# Patient Record
Sex: Female | Born: 1980 | Race: White | Hispanic: No | Marital: Single | Smoking: Former smoker
Health system: Southern US, Community
[De-identification: ages and names within clinical notes are randomized; demographics above are authoritative.]

## PROBLEM LIST (undated history)

## (undated) DIAGNOSIS — Z9889 Other specified postprocedural states: Secondary | ICD-10-CM

## (undated) DIAGNOSIS — R112 Nausea with vomiting, unspecified: Secondary | ICD-10-CM

## (undated) DIAGNOSIS — K219 Gastro-esophageal reflux disease without esophagitis: Secondary | ICD-10-CM

## (undated) DIAGNOSIS — M1990 Unspecified osteoarthritis, unspecified site: Secondary | ICD-10-CM

## (undated) DIAGNOSIS — T7840XA Allergy, unspecified, initial encounter: Secondary | ICD-10-CM

## (undated) DIAGNOSIS — N318 Other neuromuscular dysfunction of bladder: Secondary | ICD-10-CM

## (undated) HISTORY — PX: WISDOM TOOTH EXTRACTION: SHX21

## (undated) HISTORY — PX: OTHER SURGICAL HISTORY: SHX169

## (undated) HISTORY — DX: Allergy, unspecified, initial encounter: T78.40XA

## (undated) HISTORY — DX: Unspecified osteoarthritis, unspecified site: M19.90

---

## 2005-08-26 ENCOUNTER — Other Ambulatory Visit: Admission: RE | Admit: 2005-08-26 | Discharge: 2005-08-26 | Payer: Self-pay | Admitting: Gynecology

## 2006-06-02 ENCOUNTER — Ambulatory Visit: Payer: Self-pay | Admitting: Endocrinology

## 2007-07-23 ENCOUNTER — Encounter: Payer: Self-pay | Admitting: *Deleted

## 2007-07-23 DIAGNOSIS — L708 Other acne: Secondary | ICD-10-CM | POA: Insufficient documentation

## 2007-07-23 DIAGNOSIS — I959 Hypotension, unspecified: Secondary | ICD-10-CM | POA: Insufficient documentation

## 2010-04-10 ENCOUNTER — Ambulatory Visit: Payer: Self-pay | Admitting: Internal Medicine

## 2011-03-07 NOTE — Consult Note (Signed)
[facility]                            ENDOCRINOLOGY CONSULTATION   NAME:Ruth [name], Ruth [name]                          MRN:          [id number]  DATE:06/02/2006                            DOB:          1981-09-22    REASON FOR REFERRAL:  New patient self-referred.  Reason for referral is  several symptoms.   HISTORY OF PRESENT ILLNESS:  A 31 year old woman who states that she has  gained 15 pounds over the past four months after quitting smoking.  She has  associated moderate fatigue.   Several months of muscle cramps with exertion.   She had a 26-month episode of no menstrual periods, but her periods have now  resumed for the past two months.  She states that her gynecologist has told  her these symptoms are due to her schedule of athletic training.   Her acne is increased over the past year or so.  She states that she is not  at risk for  pregnancy.   PAST MEDICAL HISTORY:  Otherwise healthy.   MEDICATIONS:  No medications.   SOCIAL HISTORY:  She works as a Systems analyst.  She is single.   FAMILY HISTORY:  Negative for thyroid disease.   REVIEW OF SYSTEMS:  Slight anxiety, but she denies the following:  Fever,  headache, syncope, palpitations, nausea, vomiting, skin rash, and  depression.   PHYSICAL EXAMINATION:  VITAL SIGNS:  Blood pressure 91/59, heart rate 63,  temperature 98.2, weight 150.  GENERAL:  No distress.  SKIN:  She has acne on her face.  She does not have any increased terminal  hair.  NECK:  No goiter.  CHEST:  Clear to auscultation.  No respiratory distress.  CARDIOVASCULAR:  No JVD, no edema.  Regular rate and rhythm.  No murmur.  MUSCULOSKELETAL:  Gait is observed in the office to be normal.  NEUROLOGIC:  Alert and well-oriented.  Does not appear anxious nor depressed  and there is no tremor.   LABORATORY STUDIES:  TSH normal at 0.50.  Also, [facility] stimulation test is  done in the office on 06/02/2006.  Random cortisol 8.8  mcg/dL.  She was given  250 mcg of cosyntropin intramuscularly, and 45 minutes later, her serum  cortisol level increases to 24.5.   IMPRESSION:  1. Weight gain of uncertain etiology.  2. Mild hypotension, which is physiologic.  3. Acne.  4. No abnormality of HPA access nor of her thyroid is found.   PLAN:  Return here p.r.n..  Continue your regular followup with Dr. Nicholas Lose.                                   Sean A. Everardo All, MD   SAE/MedQ  DD:  06/04/2006  DT:  06/04/2006  Job #:  [id number]   cc:   Gretta Cool, MD

## 2011-08-26 DIAGNOSIS — D229 Melanocytic nevi, unspecified: Secondary | ICD-10-CM

## 2011-08-26 HISTORY — DX: Melanocytic nevi, unspecified: D22.9

## 2011-09-05 HISTORY — PX: SKIN BIOPSY: SHX1

## 2011-11-10 ENCOUNTER — Ambulatory Visit (INDEPENDENT_AMBULATORY_CARE_PROVIDER_SITE_OTHER): Payer: BC Managed Care – PPO

## 2011-11-10 DIAGNOSIS — M7150 Other bursitis, not elsewhere classified, unspecified site: Secondary | ICD-10-CM

## 2011-11-10 DIAGNOSIS — M7710 Lateral epicondylitis, unspecified elbow: Secondary | ICD-10-CM

## 2011-12-20 ENCOUNTER — Ambulatory Visit: Payer: BC Managed Care – PPO

## 2011-12-20 ENCOUNTER — Ambulatory Visit (INDEPENDENT_AMBULATORY_CARE_PROVIDER_SITE_OTHER): Payer: BC Managed Care – PPO | Admitting: Family Medicine

## 2011-12-20 VITALS — BP 98/66 | HR 79 | Temp 98.9°F | Resp 16 | Ht 64.5 in | Wt 140.2 lb

## 2011-12-20 DIAGNOSIS — M79609 Pain in unspecified limb: Secondary | ICD-10-CM

## 2011-12-20 DIAGNOSIS — M79643 Pain in unspecified hand: Secondary | ICD-10-CM

## 2011-12-20 DIAGNOSIS — R0789 Other chest pain: Secondary | ICD-10-CM

## 2011-12-20 DIAGNOSIS — R071 Chest pain on breathing: Secondary | ICD-10-CM

## 2011-12-20 LAB — POCT CBC
Granulocyte percent: 54.6 %G (ref 37–80)
MID (cbc): 0.4 (ref 0–0.9)
MPV: 11 fL (ref 0–99.8)
POC Granulocyte: 3.5 (ref 2–6.9)
POC MID %: 5.7 %M (ref 0–12)
Platelet Count, POC: 234 10*3/uL (ref 142–424)
RBC: 4.9 M/uL (ref 4.04–5.48)
RDW, POC: 13.4 %

## 2011-12-20 LAB — URIC ACID: Uric Acid, Serum: 3.3 mg/dL (ref 2.4–7.0)

## 2011-12-20 MED ORDER — MELOXICAM 7.5 MG PO TABS: 7.5000 mg | ORAL_TABLET | Freq: Every day | ORAL | Status: AC

## 2011-12-20 NOTE — Patient Instructions (Signed)
Your tests are reassuring that this looks like muscle/chest wall pain.  Try meloxicam as needed for pain/inflammation, heat or ice and range of motion as tolerated.  Return to the clinic or go to the nearest emergency room (or call 911) if any of your symptoms worsen or new symptoms occur, including shortness of breath, fevers,  or worsening chest pains,   Chest Wall Pain Chest wall pain is pain in or around the bones and muscles of your chest. This may occur:   On its own (spontaneously).   After a viral illness such as the flu.   Through injur.   From coughing.   Minor exercise.  It may take up to 6 weeks to get better; longer if you must stay physically active in your work and activities. HOME CARE INSTRUCTIONS   Avoid over-tiring physical activity. Try not to strain or perform activities which cause pain. This would include any activities using chest, belly (abdominal) and side muscles, especially if heavy weights are used.   Use ice on the painful area for 15 to 20 minutes per hour while awake for the first 2 days. Place the ice in a plastic bag and place a towel between the bag of ice and your skin.   Only take over-the-counter or prescription medicines for pain, discomfort, or fever as directed by your caregiver.  SEEK IMMEDIATE MEDICAL CARE IF:   Your pain increases or you are very uncomfortable.   An oral temperature above 102 F (38.9 C)develops.   Your chest pains become worse.   You develop new, unexplained problems (symptoms).   You develop nausea, vomiting, sweating or feel light headed.   You develop a cough which produces phlegm (sputum) or you cough up blood.  MAKE SURE YOU:   Understand these instructions.   Will watch your condition.   Will get help right away if you are not doing well or get worse.  Document Released: 10/06/2005 Document Revised: 04/21/2011 Document Reviewed: 05/24/2008 [facility] Patient Information 2012 [place], [place].

## 2011-12-20 NOTE — Progress Notes (Signed)
Subjective:    Patient ID: Ruth [name], female    DOB: [birth year], 31 y.o.   MRN: [id number]  HPI Ruth [name] is a 31 y.o. female Noted L chest pain/tightness last night -out of the blue.  No known inciting injury.  Notes dull pain at rest, then pressure/sharp pain  with certain movements.  2/10 at rest, 5-6 with mvmt.  Slight dyspnea - just hurts to breath, but getting air ok.  No cough.  No recent air travel or prolonged car rides.  Nonsmoker, No ocp's.  No calf/leg pain.  No FH DVT, or CAD except dad had minor stroke at 20 yo.  Treated recently for tendonitis, and finger injury, told had possible  stress fx in 2-3rd metatarsal.  Tx: ibuprofen x 2 - no relief.  Ice compression - no relief.   Systems analyst,  Estate manager/land agent.    Review of Systems  Constitutional: Negative for fever and chills.  Respiratory: Negative for cough, choking and shortness of breath.   Cardiovascular:       Chest wall sore on left- substernal, and reproduces with movement.  Musculoskeletal: Negative for joint swelling.       No calf pain.  Skin: Negative for color change, rash and wound.       Objective:   Physical Exam  Constitutional: She is oriented to person, place, and time. She appears well-developed and well-nourished. No distress.  HENT:  Head: Normocephalic and atraumatic.  Cardiovascular: Normal rate, regular rhythm, normal heart sounds and intact distal pulses.  Exam reveals no gallop.   No murmur heard. Pulmonary/Chest: Effort normal and breath sounds normal. No respiratory distress. She has no wheezes. She has no rales. She exhibits tenderness. She exhibits no crepitus, no edema, no deformity, no swelling and no retraction.         ttp along intercostals on upper anterior left - partially reproduces pain.  Abdominal: Soft. There is no tenderness. There is no rebound and no guarding.  Musculoskeletal: Normal range of motion.       Left hand: She exhibits tenderness. She exhibits normal range  of motion and normal capillary refill. normal sensation noted. Normal strength noted.       Hands:      Notes chest wall pain with ar elevation/abduction.  Neurological: She is alert and oriented to person, place, and time.  Skin: Skin is warm and dry. No rash noted. She is not diaphoretic. No erythema.  Psychiatric: She has a normal mood and affect. Her behavior is normal.   EKG: sr, no acute findings. [facility] reading (PRIMARY) by  Dr. Neva Seat:  NAD  Results for orders placed in visit on 12/20/11  POCT CBC      Component Value Range   WBC 6.4  4.6 - 10.2 (K/uL)   Lymph, poc 2.5  0.6 - 3.4    POC LYMPH PERCENT 39.7  10 - 50 (%L)   MID (cbc) 0.4  0 - 0.9    POC MID % 5.7  0 - 12 (%M)   POC Granulocyte 3.5  2 - 6.9    Granulocyte percent 54.6  37 - 80 (%G)   RBC 4.90  4.04 - 5.48 (M/uL)   Hemoglobin 13.0  12.2 - 16.2 (g/dL)   HCT, POC 40.9  81.1 - 47.9 (%)   MCV 82.2  80 - 97 (fL)   MCH, POC 26.5 (*) 27 - 31.2 (pg)   MCHC 32.3  31.8 - 35.4 (g/dL)   RDW, POC  13.4     Platelet Count, POC 234  142 - 424 (K/uL)   MPV 11.0  0 - 99.8 (fL)       Assessment & Plan:  Ruth [name] is a 31 y.o. female  L anterior chest wall pain - reproducible with exam/palpation and ROM, suspect intercostal strain/musculoskeltal cause.  Doubt pericarditis or other cardiac cause, but RTC/ER/911 precautions reviewed.  Check ESR. CBC reassuring..  Trial mobic, heat/ice prn, and ROM.  Hx: L 4th  finger pains, joint swelling in past - check uric acid, follow up at physical. (pt plans on scheduling physical for baseline labs and testing/eval).

## 2011-12-22 ENCOUNTER — Telehealth: Payer: Self-pay

## 2012-01-01 ENCOUNTER — Encounter: Payer: BC Managed Care – PPO | Admitting: Family Medicine

## 2012-01-09 ENCOUNTER — Encounter: Payer: BC Managed Care – PPO | Admitting: Family Medicine

## 2012-01-12 ENCOUNTER — Ambulatory Visit (INDEPENDENT_AMBULATORY_CARE_PROVIDER_SITE_OTHER): Payer: BC Managed Care – PPO | Admitting: Family Medicine

## 2012-01-12 VITALS — BP 96/63 | HR 81 | Temp 98.8°F | Resp 16 | Ht 64.5 in | Wt 142.6 lb

## 2012-01-12 DIAGNOSIS — R350 Frequency of micturition: Secondary | ICD-10-CM

## 2012-01-12 DIAGNOSIS — M791 Myalgia, unspecified site: Secondary | ICD-10-CM

## 2012-01-12 DIAGNOSIS — R531 Weakness: Secondary | ICD-10-CM

## 2012-01-12 DIAGNOSIS — Z0000 Encounter for general adult medical examination without abnormal findings: Secondary | ICD-10-CM

## 2012-01-12 DIAGNOSIS — IMO0001 Reserved for inherently not codable concepts without codable children: Secondary | ICD-10-CM

## 2012-01-12 DIAGNOSIS — T148XXA Other injury of unspecified body region, initial encounter: Secondary | ICD-10-CM

## 2012-01-12 DIAGNOSIS — R1032 Left lower quadrant pain: Secondary | ICD-10-CM

## 2012-01-12 LAB — POCT SEDIMENTATION RATE: POCT SED RATE: 15 mm/hr (ref 0–22)

## 2012-01-12 LAB — POCT URINALYSIS DIPSTICK
Bilirubin, UA: NEGATIVE
Blood, UA: NEGATIVE
Glucose, UA: NEGATIVE
Nitrite, UA: NEGATIVE
Spec Grav, UA: 1.01

## 2012-01-12 LAB — POCT CBC
Granulocyte percent: 61.7 %G (ref 37–80)
Hemoglobin: 12.5 g/dL (ref 12.2–16.2)
MCH, POC: 26.1 pg — AB (ref 27–31.2)
MCV: 83.8 fL (ref 80–97)
MPV: 10.9 fL (ref 0–99.8)
RBC: 4.79 M/uL (ref 4.04–5.48)
WBC: 8.5 10*3/uL (ref 4.6–10.2)

## 2012-01-12 LAB — COMPREHENSIVE METABOLIC PANEL
AST: 19 U/L (ref 0–37)
Albumin: 4.5 g/dL (ref 3.5–5.2)
Alkaline Phosphatase: 98 U/L (ref 39–117)
BUN: 8 mg/dL (ref 6–23)
Calcium: 9.6 mg/dL (ref 8.4–10.5)
Chloride: 105 mEq/L (ref 96–112)
Potassium: 4.6 mEq/L (ref 3.5–5.3)
Sodium: 141 mEq/L (ref 135–145)
Total Protein: 7.4 g/dL (ref 6.0–8.3)

## 2012-01-12 LAB — LIPID PANEL
HDL: 54 mg/dL (ref >39)
LDL Cholesterol: 78 mg/dL (ref 0–99)

## 2012-01-12 LAB — HIV ANTIBODY (ROUTINE TESTING W REFLEX): HIV: NONREACTIVE

## 2012-01-12 MED ORDER — OMEPRAZOLE 20 MG PO CPDR: 20.0000 mg | DELAYED_RELEASE_CAPSULE | Freq: Every day | ORAL | Status: DC

## 2012-01-12 NOTE — Progress Notes (Signed)
Addended by: Meredith Staggers R on: 01/12/2012 01:26 PM   Modules accepted: Orders

## 2012-01-12 NOTE — Progress Notes (Signed)
  Subjective:    Patient ID: Ruth [name], female    DOB: Apr 23, 1981, 31 y.o.   MRN: [id number]  HPI    Review of Systems     Objective:   Physical Exam   Results for orders placed in visit on 01/12/12  POCT CBC      Component Value Range   WBC 8.5  4.6 - 10.2 (K/uL)   Lymph, poc 2.7  0.6 - 3.4    POC LYMPH PERCENT 32.3  10 - 50 (%L)   MID (cbc) 0.5  0 - 0.9    POC MID % 6.0  0 - 12 (%M)   POC Granulocyte 5.2  2 - 6.9    Granulocyte percent 61.7  37 - 80 (%G)   RBC 4.79  4.04 - 5.48 (M/uL)   Hemoglobin 12.5  12.2 - 16.2 (g/dL)   HCT, POC 40.9  81.1 - 47.9 (%)   MCV 83.8  80 - 97 (fL)   MCH, POC 26.1 (*) 27 - 31.2 (pg)   MCHC 31.2 (*) 31.8 - 35.4 (g/dL)   RDW, POC 91.4     Platelet Count, POC 240  142 - 424 (K/uL)   MPV 10.9  0 - 99.8 (fL)  POCT URINALYSIS DIPSTICK      Component Value Range   Color, UA yellow     Clarity, UA clear     Glucose, UA neg     Bilirubin, UA neg     Ketones, UA neg     Spec Grav, UA 1.010     Blood, UA neg     pH, UA 7.0     Protein, UA neg     Urobilinogen, UA 0.2     Nitrite, UA neg     Leukocytes, UA Negative          Assessment & Plan:

## 2012-01-12 NOTE — Progress Notes (Signed)
  Subjective:    Patient ID: Ruth [name], female    DOB: 07-24-1981, 31 y.o.   MRN: [id number]  HPI [name] is a 31 y.o. female Here for CPE.  See scanned patient health survey - multiple responses on ROS.  Last Td approx 3 years ago. No flu shots prior.  Same sex relationship - 3 lifetime partners, no prior heterosexual intercourse, no hx sti's    Review of Systems      13 point ROS on patient health survey form reviewed. See details re: myalgias, muscle/hand  weakness, chest wall pain, nausea, vomiting and abd pain, urinary frequency/nocturia, and easy bruising.   Objective:   Physical Exam  Constitutional: She is oriented to person, place, and time. She appears well-developed and well-nourished. No distress.  HENT:  Head: Normocephalic and atraumatic.  Right Ear: External ear normal.  Left Ear: External ear normal.  Mouth/Throat: Oropharynx is clear and moist.       enlarged R >L tonsil without apparent exudate.  (has been told these were enlarged in past)   Eyes: Conjunctivae and EOM are normal. Pupils are equal, round, and reactive to light.  Neck: Normal range of motion. No thyromegaly present.  Cardiovascular: Normal rate, regular rhythm, normal heart sounds and intact distal pulses.   No murmur heard. Pulmonary/Chest: Effort normal and breath sounds normal. No respiratory distress. She has no wheezes. She has no rales.  Abdominal: Soft. Bowel sounds are normal. She exhibits no distension and no mass. There is tenderness. There is no guarding. No hernia. Hernia confirmed negative in the ventral area.    Genitourinary: Vagina normal and uterus normal. No breast swelling, tenderness, discharge or bleeding. There is no rash or lesion on the right labia. There is no rash or lesion on the left labia. Cervix exhibits no motion tenderness and no discharge. Right adnexum displays no tenderness. Left adnexum displays no tenderness. No bleeding around the vagina. No vaginal  discharge found.       Small amt blood at cervical os after brush for pap smear.    Musculoskeletal: Normal range of motion. She exhibits no edema.  Lymphadenopathy:    She has no cervical adenopathy.  Neurological: She is alert and oriented to person, place, and time. She has normal strength and normal reflexes. No sensory deficit. Coordination normal.       No focal weakness, and grip strength equal bilaterally.  Skin: Skin is warm and dry. No erythema.       Fair skinned.  Psychiatric: She has a normal mood and affect. Her behavior is normal. Judgment and thought content normal.          Assessment & Plan:

## 2012-01-12 NOTE — Progress Notes (Addendum)
  Subjective:    Patient ID: Ruth [name], female    DOB: 08/17/1981, 31 y.o.   MRN: [id number]  HPI    Review of Systems     Objective:   Physical Exam        Assessment & Plan:  See other progress note.  Closed encounter in error.  1. Annual physical exam  POCT CBC, Comprehensive metabolic panel, Lipid panel, Pap IG, CT/NG w/ reflex HPV when ASC-U, HIV antibody, RPR  2. Myalgia  POCT SEDIMENTATION RATE, CK  3. Weakness  POCT SEDIMENTATION RATE, CK  4. Bruising  POCT CBC  5. Urinary frequency  POCT urinalysis dipstick  6. Abdominal pain, left lower quadrant  POCT urinalysis dipstick, Comprehensive metabolic panel    CPE - check Pap 3 , CBC, HIV, RPR, CMP, lipids.   Abd pain, intermittent n/v with reflux sx's.  GERD vs IBS/IBD.  Check CBC, sed rate, CMP.  Trial omeprazole 20mg  qd, and recheck next few weeks to decide next step.  Myalgias - diffuse, with reports hx in past of fibromyalgia - but no meds, and reported hand wekaness, but nonfocal neuro exam - check ESR, CK, sed rate.   and recheck next few weeks to decide next step.  Easy bruising.- cbc drawn.  Recheck next 2- 3 weeks, sooner if any worsening.

## 2012-01-14 LAB — PAP IG, CT-NG, RFX HPV ASCU: GC Probe Amp: NEGATIVE

## 2012-01-16 ENCOUNTER — Encounter: Payer: Self-pay | Admitting: Family Medicine

## 2012-01-22 ENCOUNTER — Ambulatory Visit (INDEPENDENT_AMBULATORY_CARE_PROVIDER_SITE_OTHER): Payer: BC Managed Care – PPO | Admitting: Physician Assistant

## 2012-01-22 VITALS — BP 113/73 | HR 98 | Temp 101.0°F | Resp 18 | Ht 64.25 in | Wt 142.8 lb

## 2012-01-22 DIAGNOSIS — R05 Cough: Secondary | ICD-10-CM

## 2012-01-22 DIAGNOSIS — J101 Influenza due to other identified influenza virus with other respiratory manifestations: Secondary | ICD-10-CM

## 2012-01-22 DIAGNOSIS — R509 Fever, unspecified: Secondary | ICD-10-CM

## 2012-01-22 DIAGNOSIS — R059 Cough, unspecified: Secondary | ICD-10-CM

## 2012-01-22 DIAGNOSIS — R51 Headache: Secondary | ICD-10-CM

## 2012-01-22 LAB — POCT CBC
HCT, POC: 37.1 % — AB (ref 37.7–47.9)
Hemoglobin: 12.2 g/dL (ref 12.2–16.2)
Lymph, poc: 1.8 (ref 0.6–3.4)
MCH, POC: 27.3 pg (ref 27–31.2)
MCHC: 32.9 g/dL (ref 31.8–35.4)
MCV: 83.1 fL (ref 80–97)
POC Granulocyte: 3.9 (ref 2–6.9)
POC LYMPH PERCENT: 28.4 %L (ref 10–50)
RDW, POC: 14.5 %
WBC: 6.2 10*3/uL (ref 4.6–10.2)

## 2012-01-22 LAB — POCT INFLUENZA A/B: Influenza A, POC: NEGATIVE

## 2012-01-22 MED ORDER — HYDROCODONE-HOMATROPINE 5-1.5 MG/5ML PO SYRP: ORAL_SOLUTION | ORAL | Status: AC

## 2012-01-22 NOTE — Progress Notes (Signed)
Patient ID: Ruth [name] MRN: [id number], DOB: 1981-01-27, 31 y.o. Date of Encounter: 01/22/2012, 9:52 AM  Primary Physician: Tally Due, MD, MD  Chief Complaint:  Chief Complaint  Patient presents with  . Fever    x 3 days  . Chills    x 5 days  . Sore Throat    x 5 days  . Headache    x 5 days    HPI: 31 y.o. year old female presents with a 5 day history of nasal congestion, post nasal drip, sore throat, sinus pressure, and 3 day history of fever chills and headache. Tmax 102 the previous day with chills. Nasal congestion thick and green/yellow. No cough, SOB, CP, or wheezing. Ears feel full, leading to sensation of muffled hearing. Headache located along the sinuses. No focal deficits. Has tried OTC cold preps without success. No GI complaints. Appetite normal. She has unfortunately had 2 episodes of emesis, with her most recent episode being the previous day. She has since eaten and pushed fluids without issue. No flu vaccine this year. Her partner was previously ill with similar symptoms, but has since gotten better.  No sick contacts, recent antibiotics, or recent travels.   No leg trauma, sedentary periods, h/o cancer, or tobacco use.  No past medical history on file.   Home Meds: Prior to Admission medications   Medication Sig Start Date End Date Taking? Authorizing Provider  omeprazole (PRILOSEC) 20 MG capsule Take 1 capsule (20 mg total) by mouth daily. 01/12/12 01/11/13 Yes Shade Flood, MD  meloxicam (MOBIC) 7.5 MG tablet Take 1 tablet (7.5 mg total) by mouth daily. 12/20/11 12/19/12  Shade Flood, MD    Allergies: No Known Allergies  History   Social History  . Marital Status: Married    Spouse Name: N/A    Number of Children: N/A  . Years of Education: N/A   Occupational History  . Not on file.   Social History Main Topics  . Smoking status: Former Games developer  . Smokeless tobacco: Not on file  . Alcohol Use: Not on file  . Drug Use: Not on  file  . Sexually Active: Not on file   Other Topics Concern  . Not on file   Social History Narrative  . No narrative on file     Review of Systems: Constitutional: negative for night sweats or weight changes Cardiovascular: negative for chest pain or palpitations Respiratory: negative for hemoptysis, wheezing, or shortness of breath Abdominal: negative for abdominal pain or diarrhea Dermatological: negative for rash Neurologic: negative for headache   Physical Exam: Blood pressure 113/73, pulse 98, temperature 101 F (38.3 C), temperature source Oral, resp. rate 18, height 5' 4.25" (1.632 m), weight 142 lb 12.8 oz (64.774 kg), last menstrual period 12/29/2011., Body mass index is 24.32 kg/(m^2). General: Well developed, well nourished, in no acute distress. Head: Normocephalic, atraumatic, eyes without discharge, sclera non-icteric, nares are congested. Bilateral auditory canals clear, TM's are without perforation, pearly grey with reflective cone of light bilaterally. Serous effusion bilaterally behind TM's. Maxillary sinus TTP. Oral cavity moist, dentition normal. Posterior pharynx with post nasal drip and mild erythema. No peritonsillar abscess or tonsillar exudate. Neck: Supple. No thyromegaly. Full ROM. No lymphadenopathy. Lungs: Clear bilaterally to auscultation without wheezes, rales, or rhonchi. Breathing is unlabored.  Heart: RRR with S1 S2. No murmurs, rubs, or gallops appreciated. Abdomen: Soft, non-tender, non-distended with normoactive bowel sounds. No hepatosplenomegaly. No rebound/guarding. No obvious abdominal masses. McBurney's,  Rovsing's, Iliopsoas, and table jar all negative. Msk:  Strength and tone normal for age. Extremities: No clubbing or cyanosis. No edema. Neuro: Alert and oriented X 3. Moves all extremities spontaneously. CNII-XII grossly in tact. Psych:  Responds to questions appropriately with a normal affect.   Labs: Results for orders placed in visit  on 01/22/12  POCT CBC      Component Value Range   WBC 6.2  4.6 - 10.2 (K/uL)   Lymph, poc 1.8  0.6 - 3.4    POC LYMPH PERCENT 28.4  10 - 50 (%L)   MID (cbc) 0.5  0 - 0.9    POC MID % 8.5  0 - 12 (%M)   POC Granulocyte 3.9  2 - 6.9    Granulocyte percent 63.1  37 - 80 (%G)   RBC 4.47  4.04 - 5.48 (M/uL)   Hemoglobin 12.2  12.2 - 16.2 (g/dL)   HCT, POC 16.1 (*) 09.6 - 47.9 (%)   MCV 83.1  80 - 97 (fL)   MCH, POC 27.3  27 - 31.2 (pg)   MCHC 32.9  31.8 - 35.4 (g/dL)   RDW, POC 04.5     Platelet Count, POC 198  142 - 424 (K/uL)   MPV 9.8  0 - 99.8 (fL)  POCT INFLUENZA A/B      Component Value Range   Influenza A, POC Negative     Influenza B, POC Positive    POCT RAPID STREP A (OFFICE)      Component Value Range   Rapid Strep A Screen Negative  Negative      ASSESSMENT AND PLAN:  31 y.o. year old female with influenza B -Outside of Tamiflu treatment window -Hycodan #4oz 1 tsp po q 4-6 hours prn cough no RF SED -Mucinex -Tylenol/Motrin prn -Rest/fluids -RTC precautions -RTC 3-5 days if no improvement  Signed, Eula Listen, PA-C 01/22/2012 9:52 AM

## 2012-01-27 NOTE — Progress Notes (Signed)
Culture not sent as patient was influenza B positive.  Eula Listen, PA-C

## 2012-04-06 ENCOUNTER — Other Ambulatory Visit: Payer: Self-pay | Admitting: Gynecology

## 2012-04-06 DIAGNOSIS — R1900 Intra-abdominal and pelvic swelling, mass and lump, unspecified site: Secondary | ICD-10-CM

## 2012-04-08 ENCOUNTER — Ambulatory Visit
Admission: RE | Admit: 2012-04-08 | Discharge: 2012-04-08 | Disposition: A | Discharge status: Home / Self Care | Payer: BC Managed Care – PPO | Source: Ambulatory Visit | Attending: Gynecology | Admitting: Gynecology

## 2012-04-08 DIAGNOSIS — R1900 Intra-abdominal and pelvic swelling, mass and lump, unspecified site: Secondary | ICD-10-CM

## 2012-04-08 MED ORDER — IOHEXOL 300 MG/ML  SOLN
100.0000 mL | Freq: Once | INTRAMUSCULAR | Status: AC | PRN
  Administered 2012-04-08: 100 mL via INTRAVENOUS

## 2012-04-12 ENCOUNTER — Encounter (HOSPITAL_BASED_OUTPATIENT_CLINIC_OR_DEPARTMENT_OTHER): Payer: Self-pay | Admitting: *Deleted

## 2012-04-12 NOTE — Progress Notes (Signed)
Pt instructed npo p mn 6/30. To [facility] 7/1 @ 0615.  Needs hgb urine hcg on arrival.  [facility] guidelines reviewed.  Pt verbalized her understanding.

## 2012-04-16 ENCOUNTER — Telehealth: Payer: Self-pay

## 2012-04-16 NOTE — Telephone Encounter (Signed)
Called Dr. Nicholas Lose office and release was refaxed. Last CPE with Pap faxed to their office via Epic.

## 2012-04-16 NOTE — Telephone Encounter (Signed)
Last OV and CPE - fax number [phone]

## 2012-04-18 NOTE — Anesthesia Preprocedure Evaluation (Addendum)
Anesthesia Evaluation  Patient identified by MRN, date of birth, ID band Patient awake    Reviewed: Allergy & Precautions, H&P , NPO status , Patient's Chart, lab work & pertinent test results, reviewed documented beta blocker date and time   Airway Mallampati: II TM Distance: >3 FB Neck ROM: full    Dental No notable dental hx.    Pulmonary neg pulmonary ROS,  breath sounds clear to auscultation  Pulmonary exam normal       Cardiovascular Exercise Tolerance: Good negative cardio ROS  Rhythm:regular Rate:Normal     Neuro/Psych  Neuromuscular disease negative neurological ROS  negative psych ROS   GI/Hepatic negative GI ROS, Neg liver ROS, GERD-  Medicated,  Endo/Other  negative endocrine ROS  Renal/GU negative Renal ROS  negative genitourinary   Musculoskeletal   Abdominal   Peds  Hematology negative hematology ROS (+)   Anesthesia Other Findings   Reproductive/Obstetrics Denies possiblility of pregnancy                          Anesthesia Physical Anesthesia Plan  ASA: I  Anesthesia Plan: General and General ETT   Post-op Pain Management:    Induction:   Airway Management Planned:   Additional Equipment:   Intra-op Plan:   Post-operative Plan:   Informed Consent: I have reviewed the patients History and Physical, chart, labs and discussed the procedure including the risks, benefits and alternatives for the proposed anesthesia with the patient or authorized representative who has indicated his/her understanding and acceptance.   Dental Advisory Given  Plan Discussed with: CRNA  Anesthesia Plan Comments:         Anesthesia Quick Evaluation

## 2012-04-19 ENCOUNTER — Encounter (HOSPITAL_BASED_OUTPATIENT_CLINIC_OR_DEPARTMENT_OTHER): Payer: Self-pay | Admitting: *Deleted

## 2012-04-19 ENCOUNTER — Ambulatory Visit (HOSPITAL_BASED_OUTPATIENT_CLINIC_OR_DEPARTMENT_OTHER)
Admission: RE | Admit: 2012-04-19 | Discharge: 2012-04-19 | Disposition: A | Discharge status: Home / Self Care | Payer: BC Managed Care – PPO | Source: Ambulatory Visit | Attending: Gynecology | Admitting: Gynecology

## 2012-04-19 ENCOUNTER — Encounter (HOSPITAL_BASED_OUTPATIENT_CLINIC_OR_DEPARTMENT_OTHER): Payer: Self-pay | Admitting: Anesthesiology

## 2012-04-19 ENCOUNTER — Ambulatory Visit (HOSPITAL_BASED_OUTPATIENT_CLINIC_OR_DEPARTMENT_OTHER): Payer: BC Managed Care – PPO | Admitting: Anesthesiology

## 2012-04-19 ENCOUNTER — Encounter (HOSPITAL_BASED_OUTPATIENT_CLINIC_OR_DEPARTMENT_OTHER)
Admission: RE | Disposition: A | Discharge status: Home / Self Care | Payer: Self-pay | Source: Ambulatory Visit | Attending: Gynecology

## 2012-04-19 DIAGNOSIS — N803 Endometriosis of pelvic peritoneum, unspecified: Secondary | ICD-10-CM | POA: Insufficient documentation

## 2012-04-19 DIAGNOSIS — D279 Benign neoplasm of unspecified ovary: Secondary | ICD-10-CM | POA: Insufficient documentation

## 2012-04-19 HISTORY — DX: Nausea with vomiting, unspecified: R11.2

## 2012-04-19 HISTORY — PX: LAPAROSCOPY: SHX197

## 2012-04-19 HISTORY — DX: Gastro-esophageal reflux disease without esophagitis: K21.9

## 2012-04-19 HISTORY — DX: Other specified postprocedural states: Z98.890

## 2012-04-19 HISTORY — DX: Other neuromuscular dysfunction of bladder: N31.8

## 2012-04-19 LAB — POCT HEMOGLOBIN-HEMACUE: Hemoglobin: 13.2 g/dL (ref 12.0–15.0)

## 2012-04-19 LAB — POCT PREGNANCY, URINE: Preg Test, Ur: NEGATIVE

## 2012-04-19 SURGERY — LAPAROSCOPY, DIAGNOSTIC
Anesthesia: General | Site: Abdomen | Wound class: Clean

## 2012-04-19 MED ORDER — FENTANYL CITRATE 0.05 MG/ML IJ SOLN
INTRAMUSCULAR | Status: DC | PRN
  Administered 2012-04-19: 50 ug via INTRAVENOUS
  Administered 2012-04-19: 25 ug via INTRAVENOUS
  Administered 2012-04-19 (×2): 50 ug via INTRAVENOUS
  Administered 2012-04-19: 25 ug via INTRAVENOUS

## 2012-04-19 MED ORDER — DEXAMETHASONE SODIUM PHOSPHATE 4 MG/ML IJ SOLN
INTRAMUSCULAR | Status: DC | PRN
  Administered 2012-04-19: 10 mg via INTRAVENOUS

## 2012-04-19 MED ORDER — FENTANYL CITRATE 0.05 MG/ML IJ SOLN
25.0000 ug | INTRAMUSCULAR | Status: DC | PRN
  Administered 2012-04-19 (×3): 25 ug via INTRAVENOUS

## 2012-04-19 MED ORDER — ROCURONIUM BROMIDE 100 MG/10ML IV SOLN
INTRAVENOUS | Status: DC | PRN
  Administered 2012-04-19 (×2): 5 mg via INTRAVENOUS
  Administered 2012-04-19: 25 mg via INTRAVENOUS
  Administered 2012-04-19 (×3): 5 mg via INTRAVENOUS

## 2012-04-19 MED ORDER — LACTATED RINGERS IR SOLN
Status: DC | PRN
  Administered 2012-04-19: 6000 mL

## 2012-04-19 MED ORDER — NEOSTIGMINE METHYLSULFATE 1 MG/ML IJ SOLN
INTRAMUSCULAR | Status: DC | PRN
  Administered 2012-04-19: 4 mg via INTRAVENOUS

## 2012-04-19 MED ORDER — DEXTROSE 5 % IV SOLN
2.0000 g | INTRAVENOUS | Status: AC
  Administered 2012-04-19: 2 g via INTRAVENOUS

## 2012-04-19 MED ORDER — MEPERIDINE HCL 25 MG/ML IJ SOLN
12.5000 mg | Freq: Once | INTRAMUSCULAR | Status: AC
  Administered 2012-04-19: 12.5 mg via INTRAVENOUS

## 2012-04-19 MED ORDER — PROMETHAZINE HCL 25 MG/ML IJ SOLN
6.2500 mg | INTRAMUSCULAR | Status: DC | PRN
  Administered 2012-04-19: 6.25 mg via INTRAVENOUS

## 2012-04-19 MED ORDER — ONDANSETRON HCL 4 MG/2ML IJ SOLN
INTRAMUSCULAR | Status: DC | PRN
  Administered 2012-04-19: 4 mg via INTRAVENOUS

## 2012-04-19 MED ORDER — OXYCODONE-ACETAMINOPHEN 5-325 MG PO TABS
1.0000 | ORAL_TABLET | Freq: Four times a day (QID) | ORAL | Status: DC | PRN
  Administered 2012-04-19 (×2): 0.5 via ORAL

## 2012-04-19 MED ORDER — LIDOCAINE HCL (CARDIAC) 20 MG/ML IV SOLN
INTRAVENOUS | Status: DC | PRN
  Administered 2012-04-19: 75 mg via INTRAVENOUS

## 2012-04-19 MED ORDER — MIDAZOLAM HCL 5 MG/5ML IJ SOLN
INTRAMUSCULAR | Status: DC | PRN
  Administered 2012-04-19 (×2): 1 mg via INTRAVENOUS

## 2012-04-19 MED ORDER — PROPOFOL 10 MG/ML IV EMUL
INTRAVENOUS | Status: DC | PRN
  Administered 2012-04-19: 200 mg via INTRAVENOUS

## 2012-04-19 MED ORDER — LACTATED RINGERS IV SOLN
INTRAVENOUS | Status: DC
  Administered 2012-04-19 (×4): via INTRAVENOUS

## 2012-04-19 SURGICAL SUPPLY — 82 items
APPLICATOR COTTON TIP 6IN STRL (MISCELLANEOUS) ×3 IMPLANT
BAG URINE DRAINAGE (UROLOGICAL SUPPLIES) ×3 IMPLANT
BANDAGE ADHESIVE 1X3 (GAUZE/BANDAGES/DRESSINGS) ×6 IMPLANT
BENZOIN TINCTURE PRP APPL 2/3 (GAUZE/BANDAGES/DRESSINGS) ×3 IMPLANT
BLADE HEX COATED 2.75 (ELECTRODE) IMPLANT
BLADE SURG 10 STRL SS (BLADE) IMPLANT
BLADE SURG 11 STRL SS (BLADE) ×3 IMPLANT
CANISTER SUCTION 1200CC (MISCELLANEOUS) IMPLANT
CANISTER SUCTION 2500CC (MISCELLANEOUS) ×3 IMPLANT
CATH FOLEY 2WAY SLVR  5CC 16FR (CATHETERS) ×1
CATH FOLEY 2WAY SLVR 5CC 16FR (CATHETERS) ×2 IMPLANT
CATH ROBINSON RED A/P 16FR (CATHETERS) IMPLANT
CLEANER CAUTERY TIP 5X5 PAD (MISCELLANEOUS) IMPLANT
CLOTH BEACON ORANGE TIMEOUT ST (SAFETY) ×3 IMPLANT
COVER MAYO STAND STRL (DRAPES) IMPLANT
COVER TABLE BACK 60X90 (DRAPES) IMPLANT
DECANTER SPIKE VIAL GLASS SM (MISCELLANEOUS) IMPLANT
DERMABOND ADVANCED (GAUZE/BANDAGES/DRESSINGS) ×1
DERMABOND ADVANCED .7 DNX12 (GAUZE/BANDAGES/DRESSINGS) ×2 IMPLANT
DRAPE CAMERA CLOSED 9X96 (DRAPES) ×3 IMPLANT
DRAPE LAPAROTOMY TRNSV 102X78 (DRAPE) IMPLANT
DRAPE WARM FLUID 44X44 (DRAPE) IMPLANT
DRESSING TELFA 8X3 (GAUZE/BANDAGES/DRESSINGS) ×3 IMPLANT
ELECT REM PT RETURN 9FT ADLT (ELECTROSURGICAL) ×3
ELECTRODE REM PT RTRN 9FT ADLT (ELECTROSURGICAL) ×2 IMPLANT
FILTER SMOKE EVAC LAPAROSHD (FILTER) ×3 IMPLANT
FORCEPS BIOP CUTTING (INSTRUMENTS) IMPLANT
GAUZE SPONGE 4X4 12PLY STRL LF (GAUZE/BANDAGES/DRESSINGS) ×3 IMPLANT
GLOVE BIO SURGEON STRL SZ8 (GLOVE) ×9 IMPLANT
GLOVE ECLIPSE 6.5 STRL STRAW (GLOVE) ×3 IMPLANT
GLOVE ECLIPSE 7.5 STRL STRAW (GLOVE) ×3 IMPLANT
GLOVE INDICATOR 6.5 STRL GRN (GLOVE) ×3 IMPLANT
GOWN PREVENTION PLUS LG XLONG (DISPOSABLE) ×3 IMPLANT
GOWN STRL REIN XL XLG (GOWN DISPOSABLE) ×6 IMPLANT
HOLDER FOLEY CATH W/STRAP (MISCELLANEOUS) ×3 IMPLANT
NEEDLE HYPO 25X1 1.5 SAFETY (NEEDLE) ×3 IMPLANT
NEEDLE INSUFFLATION 14GA 120MM (NEEDLE) ×6 IMPLANT
NEEDLE INSUFFLATION 14GA 150MM (NEEDLE) IMPLANT
NS IRRIG 500ML POUR BTL (IV SOLUTION) ×9 IMPLANT
PACK BASIN DAY SURGERY FS (CUSTOM PROCEDURE TRAY) ×3 IMPLANT
PACK LAPAROSCOPY II (CUSTOM PROCEDURE TRAY) ×3 IMPLANT
PAD CLEANER CAUTERY TIP 5X5 (MISCELLANEOUS)
PAD OB MATERNITY 4.3X12.25 (PERSONAL CARE ITEMS) ×3 IMPLANT
PAD PREP 24X48 CUFFED NSTRL (MISCELLANEOUS) ×3 IMPLANT
PENCIL BUTTON HOLSTER BLD 10FT (ELECTRODE) IMPLANT
POUCH SPECIMEN RETRIEVAL 10MM (ENDOMECHANICALS) ×3 IMPLANT
SCISSORS LAP 5X35 DISP (ENDOMECHANICALS) IMPLANT
SEALER TISSUE G2 CVD JAW 35 (ENDOMECHANICALS) IMPLANT
SEALER TISSUE G2 CVD JAW 45CM (ENDOMECHANICALS)
SET IRRIG TUBING LAPAROSCOPIC (IRRIGATION / IRRIGATOR) ×3 IMPLANT
SOLUTION ANTI FOG 6CC (MISCELLANEOUS) ×3 IMPLANT
SOLUTION ELECTROLUBE (MISCELLANEOUS) IMPLANT
SPONGE GAUZE 2X2 8PLY STRL LF (GAUZE/BANDAGES/DRESSINGS) ×3 IMPLANT
SPONGE LAP 18X18 X RAY DECT (DISPOSABLE) IMPLANT
STAPLER VISISTAT 35W (STAPLE) IMPLANT
STRIP CLOSURE SKIN 1/2X4 (GAUZE/BANDAGES/DRESSINGS) ×3 IMPLANT
STRIP CLOSURE SKIN 1/4X4 (GAUZE/BANDAGES/DRESSINGS) IMPLANT
SUT MON AB 2-0 SH 27 (SUTURE) ×1
SUT MON AB 2-0 SH27 (SUTURE) ×2 IMPLANT
SUT MON AB-0 CT1 36 (SUTURE) ×3 IMPLANT
SUT VIC AB 0 CT1 18XCR BRD 8 (SUTURE) ×4 IMPLANT
SUT VIC AB 0 CT1 36 (SUTURE) IMPLANT
SUT VIC AB 0 CT1 8-18 (SUTURE) ×2
SUT VIC AB 3-0 CTX 36 (SUTURE) ×3 IMPLANT
SUT VIC AB 5-0 PS2 18 (SUTURE) ×3 IMPLANT
SUT VICRYL 0 TIES 12 18 (SUTURE) IMPLANT
SUT VICRYL 0 UR6 27IN ABS (SUTURE) ×3 IMPLANT
SYR 3ML 23GX1 SAFETY (SYRINGE) IMPLANT
SYR BULB IRRIGATION 50ML (SYRINGE) ×3 IMPLANT
SYR CONTROL 10ML LL (SYRINGE) ×3 IMPLANT
SYRINGE 10CC LL (SYRINGE) ×6 IMPLANT
TOWEL OR 17X24 6PK STRL BLUE (TOWEL DISPOSABLE) ×6 IMPLANT
TRAY DSU PREP LF (CUSTOM PROCEDURE TRAY) ×3 IMPLANT
TROCAR BLADED SMOOTH C0639 (ENDOMECHANICALS) IMPLANT
TROCAR XCEL NON-BLD 11X100MML (ENDOMECHANICALS) IMPLANT
TROCAR Z-THREAD BLADED 11X100M (TROCAR) ×3 IMPLANT
TROCAR Z-THREAD BLADED 5X100MM (TROCAR) ×6 IMPLANT
TUBE CONNECTING 12X1/4 (SUCTIONS) IMPLANT
TUBING INSUFFLATION W/FILTER (TUBING) ×3 IMPLANT
VACUUM HOSE/TUBING 7/8INX6FT (MISCELLANEOUS) IMPLANT
WATER STERILE IRR 500ML POUR (IV SOLUTION) ×3 IMPLANT
YANKAUER SUCT BULB TIP NO VENT (SUCTIONS) IMPLANT

## 2012-04-19 NOTE — Discharge Instructions (Addendum)
  Post Anesthesia Home Care Instructions  Activity: Get plenty of rest for the remainder of the day. A responsible adult should stay with you for 24 hours following the procedure.  For the next 24 hours, DO NOT: -Drive a car -Advertising copywriter -Drink alcoholic beverages -Take any medication unless instructed by your physician -Make any legal decisions or sign important papers.  Meals: Start with liquid foods such as gelatin or soup. Progress to regular foods as tolerated. Avoid greasy, spicy, heavy foods. If nausea and/or vomiting occur, drink only clear liquids until the nausea and/or vomiting subsides. Call your physician if vomiting continues.  Special Instructions/Symptoms: Your throat may feel dry or sore from the anesthesia or the breathing tube placed in your throat during surgery. If this causes discomfort, gargle with warm salt water. The discomfort should disappear within 24 hours.  HOME CARE INSTRUCTIONS - LAPAROSCOPY  Wound Care:   The band aids or dressings which are placed on the skin opening may be removed the day after surgery.  The incision should be kept clean and dry.  The stitches do not need to be removed.  Should the incision become sore, red and swollen after the first week, check with your doctor.  Persona Hygiene: Shower or tub bathe the day after your procedure.  Always wipe from front to back after elimination.  Activity: Do not drive or operate any equipment today.  The effects of the anesthesia are sill present and drowsiness may result.  Rest today - not necessarily flat bed rest.  Just take it easy.  You may resume your normal activity in two to three days or as instructed by your physician.  Sexual Activity: You may resume sexual activity as indicated by your physician.  If your laparoscopy was for a sterilization (tubes tied), continue current method of birth control until after your next period or ask for specific instructions from your  doctor.  Diet: Eat a light diet as desired this evening.  You may resume a regular diet tomorrow.   Return to Work: You may return to work in tow or three days, or as indicated by Designer, multimedia.  General Expectations of Your Surgery: Your surgery will cause vaginal drainage or spotting which may continue for 2-3 days.  Mild abdominal discomfort or tenderness is not unusual and some shoulder pain may also be noted, which can be relieved by lying flat in bed.  Unexpected Observations - Call Your Doctor if These Occur:  -persistent or heavy bleeding at incision site  -redness or swellling around incision  -elevation of temperature greater than 100 degrees Farenheit.  Return to your Doctor's Office:  Call to set up an appointment.  Patient's Signature:  _______________________________________________________  Nurse's Signature:  ________________________________________________________

## 2012-04-19 NOTE — Anesthesia Postprocedure Evaluation (Signed)
  Anesthesia Post-op Note  Patient: Ruth [name]  Procedure(s) Performed: Procedure(s) (LRB): LAPAROSCOPY DIAGNOSTIC (N/A) CO2 LASER APPLICATION (N/A)  Patient Location: PACU  Anesthesia Type: General  Level of Consciousness: awake and alert   Airway and Oxygen Therapy: Patient Spontanous Breathing  Post-op Pain: mild  Post-op Assessment: Post-op Vital signs reviewed, Patient's Cardiovascular Status Stable, Respiratory Function Stable, Patent Airway and No signs of Nausea or vomiting  Post-op Vital Signs: stable  Complications: No apparent anesthesia complications

## 2012-04-19 NOTE — Anesthesia Procedure Notes (Signed)
Procedure Name: Intubation Date/Time: 04/19/2012 7:38 AM Performed by: Fran Lowes Pre-anesthesia Checklist: Patient identified, Emergency Drugs available, Suction available and Patient being monitored Patient Re-evaluated:Patient Re-evaluated prior to inductionOxygen Delivery Method: Circle System Utilized Preoxygenation: Pre-oxygenation with 100% oxygen Intubation Type: IV induction Ventilation: Mask ventilation without difficulty Laryngoscope Size: Mac and 3 Grade View: Grade I Tube type: Oral Tube size: 7.0 mm Number of attempts: 1 Airway Equipment and Method: stylet,  oral airway and LTA kit utilized Placement Confirmation: ETT inserted through vocal cords under direct vision,  positive ETCO2 and breath sounds checked- equal and bilateral Secured at: 21 cm Tube secured with: Tape Dental Injury: Teeth and Oropharynx as per pre-operative assessment

## 2012-04-19 NOTE — Transfer of Care (Signed)
Immediate Anesthesia Transfer of Care Note  Patient: Ruth [name]  Procedure(s) Performed: Procedure(s) (LRB): LAPAROSCOPY DIAGNOSTIC (N/A) CO2 LASER APPLICATION (N/A)  Patient Location: Patient transported to PACU with oxygen via face mask at 4 Liters / Min  Anesthesia Type: General  Level of Consciousness: awake and alert   Airway & Oxygen Therapy: Patient Spontanous Breathing and Patient connected to face mask oxygen  Post-op Assessment: Report given to PACU RN and Post -op Vital signs reviewed and stable  Post vital signs: Reviewed and stable  Dentition: Teeth and oropharynx remain in pre-op condition  Complications: No apparent anesthesia complications

## 2012-04-20 ENCOUNTER — Encounter (HOSPITAL_BASED_OUTPATIENT_CLINIC_OR_DEPARTMENT_OTHER): Payer: Self-pay | Admitting: Gynecology

## 2012-04-20 NOTE — Op Note (Signed)
NAMETASHIKA, GOODIN                 ACCOUNT NO.:  [id number]  MEDICAL RECORD NO.:  [id number]  LOCATION:                               FACILITY:  [facility]  PHYSICIAN:  Gretta Cool, M.D. 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  POSTOPERATIVE DIAGNOSES: 1. Mature benign cystic teratoma. 2. Endometriosis involving the cul-de-sac and the posterior leaf of     the broad ligament.  PROCEDURES: 1. Diagnostic laparoscopy, laser incision of the ovarian capsule,     hydrodissection and extraction of benign cystic teratoma, EndoCatch     removal of benign cystic teratoma with aspiration of its contents     from the EndoCatch and delivery through the subumbilical port. 2. Cautery of multiple cul-de-sac and broad ligaments to endometrial     implants.  SURGEON:  Gretta Cool, MD  ASSISTANT:  Luvenia Redden, MD  ANESTHESIA:  General orotracheal.  DESCRIPTION OF PROCEDURE:  Under general anesthesia orotracheal, with the patient prepped and draped in Allen stirrups in the lithotomy position with Foley catheter draining her bladder and a Hulka tenaculum applied to her cervix, a subumbilical incision was made.  After adequate pneumoperitoneum with carbon dioxide, the laparoscopic trocar was introduced and the pelvic organs visualized.  The large left ovarian benign cystic teratoma was relatively free and mobile, posterior to the uterus but that easily elevated out of the cul-de-sac.  Upon elevation of the adnexal structures, there was noted to be endometriosis in the cul-de-sac and on the posterior leaf of the broad ligaments.  The accessory ports were then placed under direct vision and the laser applied to the operating laparoscope.  The laser was then used to  incise the ovarian capsule so as to begin undermining the capsule of the ovary and extraction of the benign cystic teratoma.  The extraction was complicated because of significant adhesion to the capsule and because of the very large size of the benign cystic teratoma.  The edges were grasped with the million dollar grasper atraumatically as possible.  The capsule was further incised with scissors until the benign cystic teratoma could be shelled out of the ovary.  It was dissected by sharp and blunt dissection done by hydrodissection until all of the capsule was removed and the tumor enucleated from the ovary completely.  Bipolar cautery was used to control minimal bleeding from the ovarian capsule. The benign cystic teratoma was then placed in an EndoCatch.  It was then aspirated as much as possible to remove the liquid fatty material.  Once the liquid fatty material was removed, the EndoCatch was delivered through the umbilical port.  Once this was sufficiently decompressed, the EndoCatch was delivered sufficiently to allow Kelly clamp to grasp the benign cystic teratoma itself.  The lesion was then delivered entirely through the 10-mm laparoscopic port.  At this point, the port was replaced and the pelvis was irrigated with Lactated Ringer's to remove all debris.  Several  more bleeding points were identified on the ovarian capsule and cauterized.  At reduced pressure, there was no significant bleeding.  At this point, the ovary pretty much had collapsed upon itself.  The bipolar cautery was then used to coagulate multiple sites of endometriosis in the cul-de-sac and on posterior leaf of the broad ligament.  At this point, the procedure was terminated without complication as much as possible and the gas was allowed to escape from the peritoneal cavity.  The 10-mm port site was closed with interrupted sutures of #0 Vicryl and subcuticular closure of 5-0 Vicryl. The smaller port sites  were closed with 5-0 Vicryl and Steri-Strips.  At the end of the procedure, there were no complications.  ESTIMATED BLOOD LOSS:  Less than 100 mL.  COMPLICATIONS:  None.  DISPOSITION:  The patient returned to the recovery room in excellent condition without complication.          ______________________________ Gretta Cool, M.D.     CWL/MEDQ  D:  04/19/2012  T:  04/20/2012  Job:  [id number]

## 2012-04-27 ENCOUNTER — Encounter (HOSPITAL_BASED_OUTPATIENT_CLINIC_OR_DEPARTMENT_OTHER): Payer: Self-pay

## 2012-06-12 ENCOUNTER — Encounter: Payer: Self-pay | Admitting: Family Medicine

## 2012-06-12 DIAGNOSIS — D279 Benign neoplasm of unspecified ovary: Secondary | ICD-10-CM | POA: Insufficient documentation

## 2012-07-13 NOTE — Telephone Encounter (Signed)
done

## 2012-09-28 ENCOUNTER — Ambulatory Visit (INDEPENDENT_AMBULATORY_CARE_PROVIDER_SITE_OTHER): Payer: BC Managed Care – PPO | Admitting: Family Medicine

## 2012-09-28 VITALS — BP 94/63 | HR 104 | Temp 99.1°F | Resp 16 | Ht 64.0 in | Wt 144.0 lb

## 2012-09-28 DIAGNOSIS — R059 Cough, unspecified: Secondary | ICD-10-CM

## 2012-09-28 DIAGNOSIS — R05 Cough: Secondary | ICD-10-CM

## 2012-09-28 DIAGNOSIS — J029 Acute pharyngitis, unspecified: Secondary | ICD-10-CM

## 2012-09-28 LAB — POCT RAPID STREP A (OFFICE): Rapid Strep A Screen: NEGATIVE

## 2012-09-28 MED ORDER — AZITHROMYCIN 250 MG PO TABS: ORAL_TABLET | ORAL | Status: DC

## 2012-09-28 MED ORDER — HYDROCODONE-HOMATROPINE 5-1.5 MG/5ML PO SYRP: 5.0000 mL | ORAL_SOLUTION | Freq: Three times a day (TID) | ORAL | Status: DC | PRN

## 2012-09-28 NOTE — Patient Instructions (Signed)

## 2012-09-28 NOTE — Progress Notes (Signed)
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  No cough  Objective:  NAD Throat very red, no exudates Neck: mild anterior cervical adenopathy TM's:  White serous changes left ear Results for orders placed in visit on 09/28/12  POCT RAPID STREP A (OFFICE)      Component Value Range   Rapid Strep A Screen Negative  Negative    Assessment:  Sore throat

## 2013-03-02 IMAGING — CT CT PELVIS W/ CM
2 of 3 series · 17 of 46 positions shown, 19 images · IV contrast (READICAT & [ID] OMNI 300)
Comparison: None.

CLINICAL DATA: Pelvic mass felt on physical exam, pelvic pain,
some difficulty urinating

CT PELVIS WITH CONTRAST
TECHNIQUE: Multidetector CT imaging of the pelvis was performed
using the standard protocol following the bolus administration of
intravenous contrast.
Contrast: 100mL OMNIPAQUE IOHEXOL 300 MG/ML  SOLN

[Series 2: routine pelvis · axial · 0.85mm/px · z∈[-197,-12]mm · 14 of 43 slices shown, 16 images]
[im 3/43  soft-tissue]
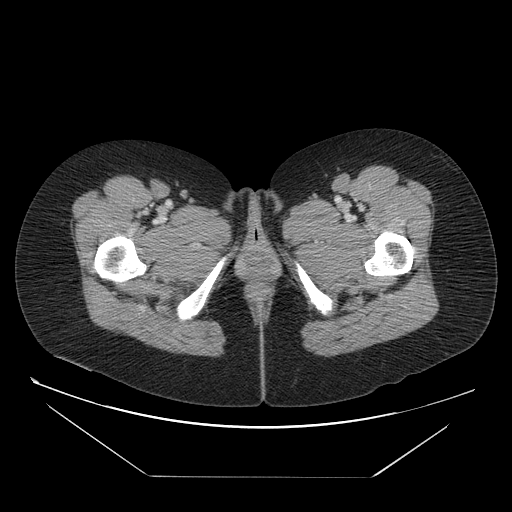
[im 3/43  bone]
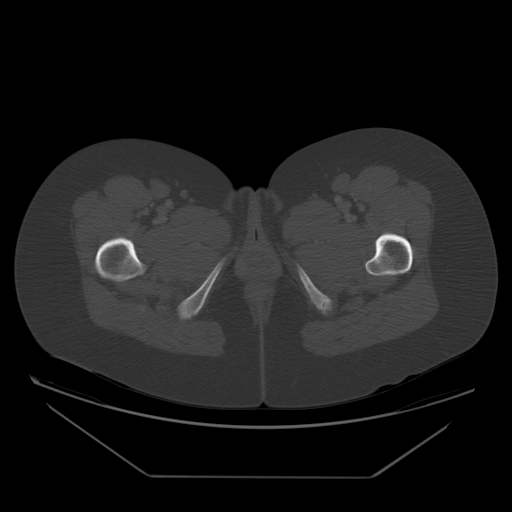
[im 6/43  soft-tissue]
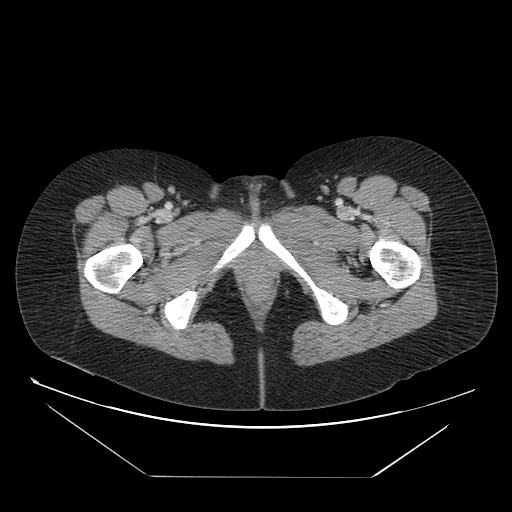
[im 9/43  soft-tissue]
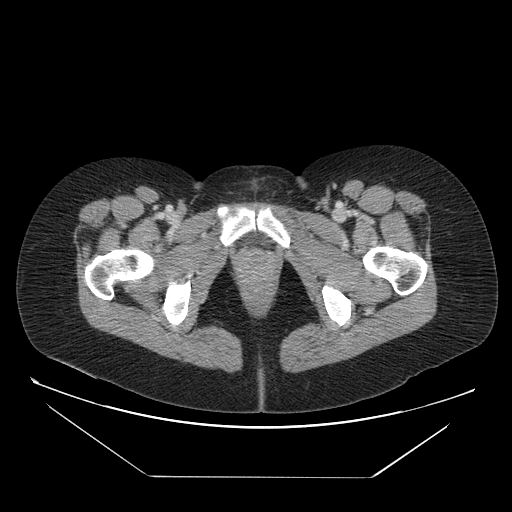
[im 11/43  soft-tissue]
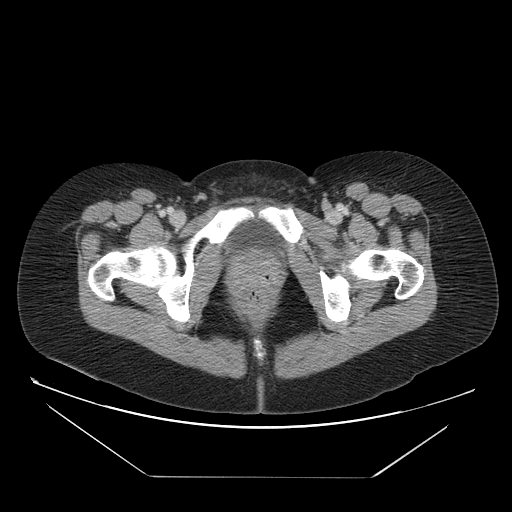
[im 14/43  soft-tissue]
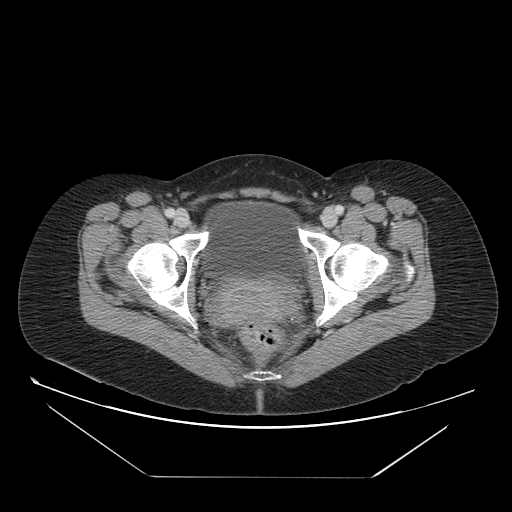
[im 17/43  soft-tissue]
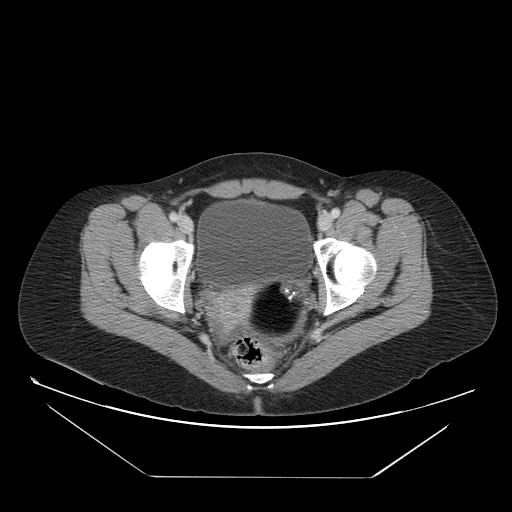
[im 19/43  soft-tissue]
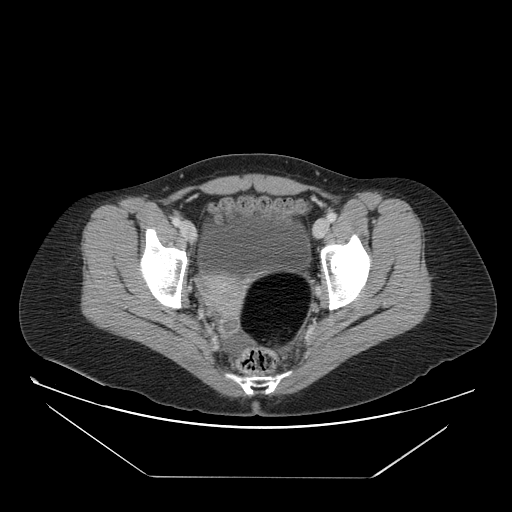
[im 24/43  soft-tissue]
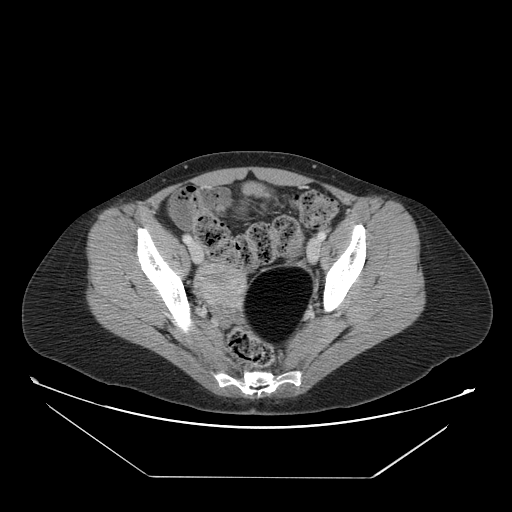
[im 26/43  soft-tissue]
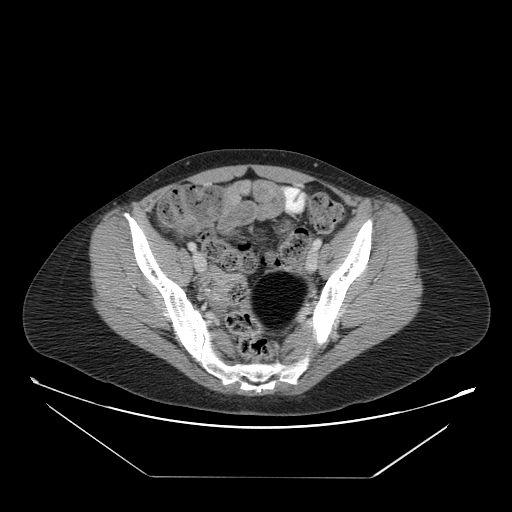
[im 26/43  bone]
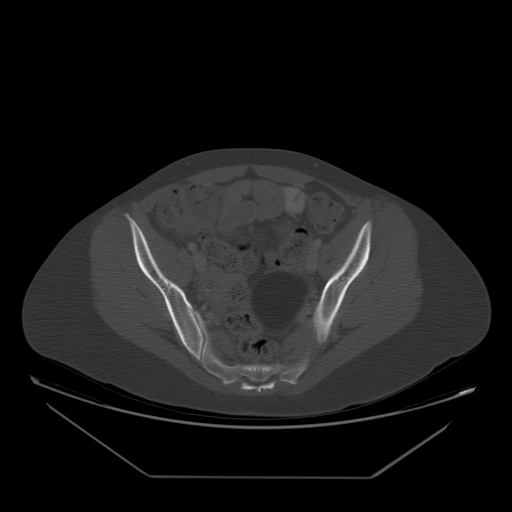
[im 29/43  soft-tissue]
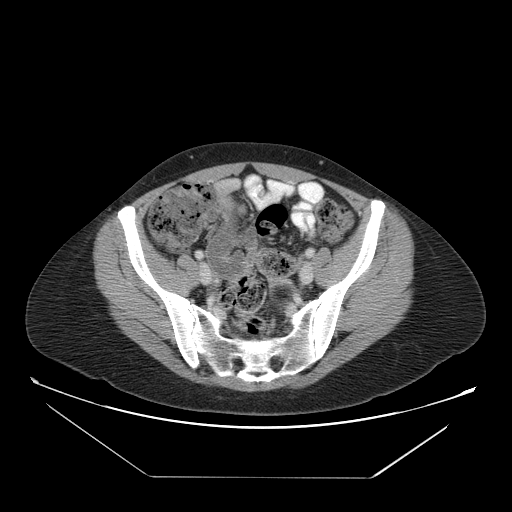
[im 32/43  soft-tissue]
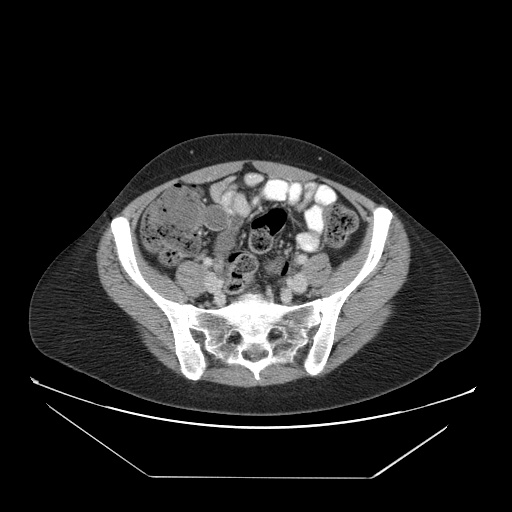
[im 34/43  soft-tissue]
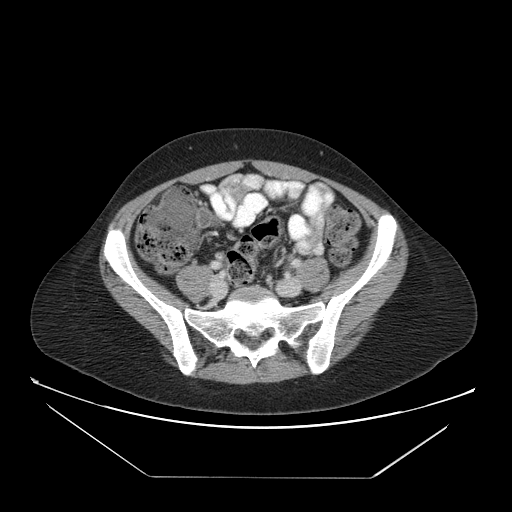
[im 37/43  soft-tissue]
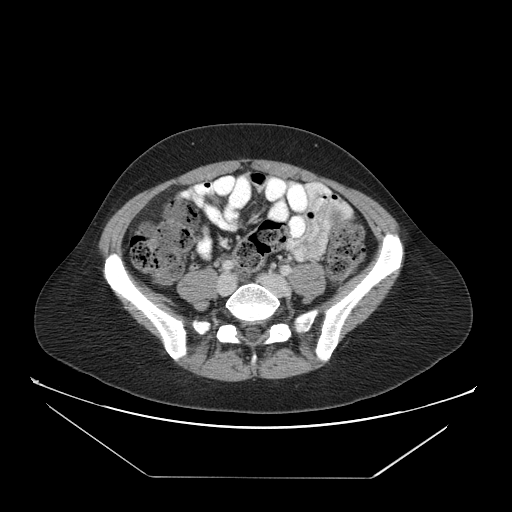
[im 40/43  soft-tissue]
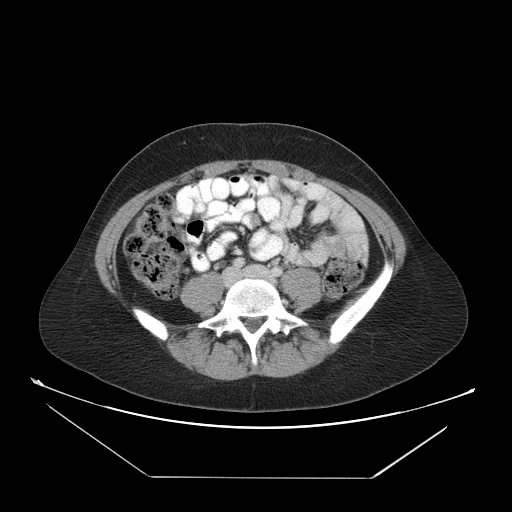

[Series 300: coronal · coronal · 0.85mm/px · 3 of 108 slices shown]
[im 36/108  soft-tissue]
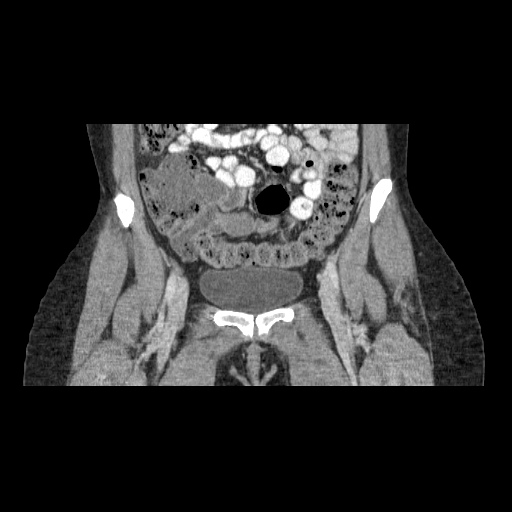
[im 48/108  soft-tissue]
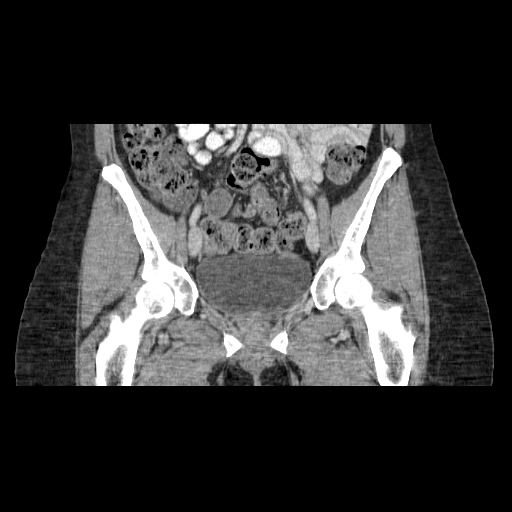
[im 60/108  soft-tissue]
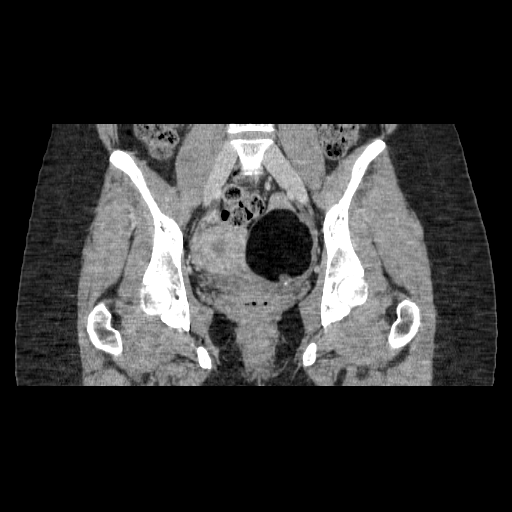

[17 of 46 positions shown; findings below may reference images not displayed]

FINDINGS: Within the left pelvis there is a large soft tissue mass
in the left adnexa measuring 7.6 x 5.6 x 6.0 cm with an attenuation
of -134 HU.  This fatty mass does contain some soft tissue and
calcification, and is therefore consistent with a left ovarian
dermoid tumor.  The uterus is pushed to the right of midline and is
unremarkable.  There is a probable collapsing right ovarian cyst
posteriorly measuring 2.3 x 1.8 cm.  A small amount of free fluid
is noted within the pelvis.  The urinary bladder is moderately
urine distended and unremarkable.  No abnormality of the colon is
seen.  The appendix is unremarkable.
IMPRESSION: 1.  Left ovarian dermoid tumor of 7.6 x 5.6 x 6.0 cm.
2.  Probable collapsing right ovarian cyst of 2.3 cm. Small amount
of free fluid.

## 2013-06-22 ENCOUNTER — Ambulatory Visit: Payer: BC Managed Care – PPO

## 2013-06-22 ENCOUNTER — Ambulatory Visit (INDEPENDENT_AMBULATORY_CARE_PROVIDER_SITE_OTHER): Payer: BC Managed Care – PPO | Admitting: Family Medicine

## 2013-06-22 VITALS — BP 110/62 | HR 96 | Temp 98.0°F | Resp 18 | Ht 65.0 in | Wt 131.0 lb

## 2013-06-22 DIAGNOSIS — M545 Low back pain, unspecified: Secondary | ICD-10-CM

## 2013-06-22 DIAGNOSIS — N760 Acute vaginitis: Secondary | ICD-10-CM

## 2013-06-22 DIAGNOSIS — R109 Unspecified abdominal pain: Secondary | ICD-10-CM

## 2013-06-22 DIAGNOSIS — B9689 Other specified bacterial agents as the cause of diseases classified elsewhere: Secondary | ICD-10-CM

## 2013-06-22 DIAGNOSIS — R110 Nausea: Secondary | ICD-10-CM

## 2013-06-22 DIAGNOSIS — N898 Other specified noninflammatory disorders of vagina: Secondary | ICD-10-CM

## 2013-06-22 LAB — POCT UA - MICROSCOPIC ONLY
Casts, Ur, LPF, POC: NEGATIVE
Crystals, Ur, HPF, POC: NEGATIVE
Mucus, UA: NEGATIVE
RBC, urine, microscopic: NEGATIVE
Yeast, UA: NEGATIVE

## 2013-06-22 LAB — POCT CBC
Granulocyte percent: 61.3 %G (ref 37–80)
HCT, POC: 42.7 % (ref 37.7–47.9)
Hemoglobin: 13.5 g/dL (ref 12.2–16.2)
Lymph, poc: 3.2 (ref 0.6–3.4)
MCH, POC: 26.9 pg — AB (ref 27–31.2)
MCHC: 31.6 g/dL — AB (ref 31.8–35.4)
MCV: 85 fL (ref 80–97)
MID (cbc): 0.6 (ref 0–0.9)
MPV: 10.4 fL (ref 0–99.8)
POC Granulocyte: 6.1 (ref 2–6.9)
POC LYMPH PERCENT: 32.5 %L (ref 10–50)
POC MID %: 6.2 %M (ref 0–12)
Platelet Count, POC: 288 10*3/uL (ref 142–424)
RBC: 5.02 M/uL (ref 4.04–5.48)
RDW, POC: 14.6 %
WBC: 9.9 10*3/uL (ref 4.6–10.2)

## 2013-06-22 LAB — POCT WET PREP WITH KOH
Clue Cells Wet Prep HPF POC: 100
KOH Prep POC: NEGATIVE
Trichomonas, UA: NEGATIVE
Yeast Wet Prep HPF POC: NEGATIVE

## 2013-06-22 LAB — POCT URINALYSIS DIPSTICK
Bilirubin, UA: NEGATIVE
Glucose, UA: NEGATIVE
Ketones, UA: NEGATIVE
Nitrite, UA: NEGATIVE
Protein, UA: NEGATIVE
Spec Grav, UA: 1.01
Urobilinogen, UA: 0.2
pH, UA: 6

## 2013-06-22 LAB — BASIC METABOLIC PANEL
BUN: 6 mg/dL (ref 6–23)
CO2: 23 mEq/L (ref 19–32)
Calcium: 9.4 mg/dL (ref 8.4–10.5)
Chloride: 106 mEq/L (ref 96–112)
Creat: 0.67 mg/dL (ref 0.50–1.10)
Glucose, Bld: 81 mg/dL (ref 70–99)
Potassium: 4.4 mEq/L (ref 3.5–5.3)
Sodium: 137 mEq/L (ref 135–145)

## 2013-06-22 MED ORDER — ONDANSETRON 4 MG PO TBDP
8.0000 mg | ORAL_TABLET | Freq: Once | ORAL | Status: AC
  Administered 2013-06-22: 8 mg via ORAL

## 2013-06-22 MED ORDER — METRONIDAZOLE 500 MG PO TABS: 500.0000 mg | ORAL_TABLET | Freq: Two times a day (BID) | ORAL | Status: DC

## 2013-06-22 MED ORDER — CEFTRIAXONE SODIUM 1 G IJ SOLR
1.0000 g | Freq: Once | INTRAMUSCULAR | Status: AC
  Administered 2013-06-22: 1 g via INTRAMUSCULAR

## 2013-06-22 NOTE — Progress Notes (Signed)
Subjective:    Patient ID: Ruth [name], female    DOB: 27-Mar-1981, 32 y.o.   MRN: [id number]  HPI  32 year old female presents for evaluation of left flank pain and lower abdominal pain x 10 days.  States symptoms started on 8/22 with dysuria, frequency, and hematuria.  Went to another urgent care and diagnosed with a "kidney infection" and treated with 7 days of Cipro.  Admits symptoms did not improve with this, in fact they seem to have worsened. Admits to the development of left flank pain that is intermittent in nature - baseline 3/10 but will increase to 6-7/10 on occasion.  Has had nausea without vomiting.  Continues to have dysuria and frequency.  No hx of kidney stones or frequent UTI's.  She is healthy with no known medical problems.  Denies vaginal discharge or concern about STI's.  In a monogamous relationship.      Review of Systems  Constitutional: Positive for chills. Negative for fever.  Respiratory: Negative for cough.   Gastrointestinal: Positive for nausea and abdominal pain (suprapubic). Negative for vomiting.  Genitourinary: Positive for dysuria, frequency and flank pain (left sided). Negative for vaginal bleeding and vaginal discharge.  Neurological: Negative for dizziness and headaches.       Objective:   Physical Exam  Constitutional: She is oriented to person, place, and time. She appears well-developed and well-nourished.  HENT:  Head: Normocephalic and atraumatic.  Right Ear: External ear normal.  Left Ear: External ear normal.  Eyes: Conjunctivae are normal.  Neck: Normal range of motion. Neck supple.  Cardiovascular: Normal rate, regular rhythm and normal heart sounds.   Pulmonary/Chest: Effort normal and breath sounds normal.  Abdominal: Soft. Normal appearance and bowel sounds are normal. There is tenderness in the suprapubic area. There is CVA tenderness (left side). There is no rigidity, no rebound, no guarding, no tenderness at McBurney's point and  negative Murphy's sign.  Genitourinary: Vagina normal and uterus normal. Pelvic exam was performed with patient supine. There is no rash, tenderness or lesion on the right labia. There is no rash, tenderness or lesion on the left labia. Cervix exhibits no motion tenderness, no discharge and no friability. Right adnexum displays no mass, no tenderness and no fullness. Left adnexum displays no mass, no tenderness and no fullness.  Neurological: She is alert and oriented to person, place, and time.  Psychiatric: She has a normal mood and affect. Her behavior is normal. Judgment and thought content normal.    Results for orders placed in visit on 06/22/13  POCT URINALYSIS DIPSTICK      Result Value Range   Color, UA yellow     Clarity, UA cloudy     Glucose, UA neg     Bilirubin, UA neg     Ketones, UA neg     Spec Grav, UA 1.010     Blood, UA tr-intact     pH, UA 6.0     Protein, UA neg     Urobilinogen, UA 0.2     Nitrite, UA neg     Leukocytes, UA Trace    POCT UA - MICROSCOPIC ONLY      Result Value Range   WBC, Ur, HPF, POC 1-4     RBC, urine, microscopic neg     Bacteria, U Microscopic 1+     Mucus, UA neg     Epithelial cells, urine per micros 8-12     Crystals, Ur, HPF, POC neg  Casts, Ur, LPF, POC neg     Yeast, UA neg    POCT CBC      Result Value Range   WBC 9.9  4.6 - 10.2 K/uL   Lymph, poc 3.2  0.6 - 3.4   POC LYMPH PERCENT 32.5  10 - 50 %L   MID (cbc) 0.6  0 - 0.9   POC MID % 6.2  0 - 12 %M   POC Granulocyte 6.1  2 - 6.9   Granulocyte percent 61.3  37 - 80 %G   RBC 5.02  4.04 - 5.48 M/uL   Hemoglobin 13.5  12.2 - 16.2 g/dL   HCT, POC 16.1  09.6 - 47.9 %   MCV 85.0  80 - 97 fL   MCH, POC 26.9 (*) 27 - 31.2 pg   MCHC 31.6 (*) 31.8 - 35.4 g/dL   RDW, POC 04.5     Platelet Count, POC 288  142 - 424 K/uL   MPV 10.4  0 - 99.8 fL  POCT WET PREP WITH KOH      Result Value Range   Trichomonas, UA Negative     Clue Cells Wet Prep HPF POC 100%     Epithelial Wet  Prep HPF POC 7-15     Yeast Wet Prep HPF POC neg     Bacteria Wet Prep HPF POC 4+     RBC Wet Prep HPF POC 2-6     WBC Wet Prep HPF POC 1-5     KOH Prep POC Negative          [facility] reading (PRIMARY) by  Dr. Clelia Croft as no acute abnormality noted.    Assessment & Plan:  Lower back pain - Plan: POCT urinalysis dipstick, POCT UA - Microscopic Only, DG Abd 1 View  Nausea alone - Plan: POCT urinalysis dipstick, POCT UA - Microscopic Only, POCT CBC, ondansetron (ZOFRAN-ODT) disintegrating tablet 8 mg  Leukorrhea, not specified as infective - Plan: POCT Wet Prep with KOH, GC/Chlamydia Probe Amp  Flank pain - Plan: Basic metabolic panel, cefTRIAXone (ROCEPHIN) injection 1 g  Bacterial vaginosis - Plan: metroNIDAZOLE (FLAGYL) 500 MG tablet  UA relatively clear today. Will send urine culture and await BMET.  Rocephin 1 gram to treat for possible pyelo based on CVA tenderness Flagyl 500 mg bid x 7 days to treat BV - no ETOH while taking Declined Zofran to go home with.  Will monitor pain and nausea closely.  Instructed to return or go to ER if symptoms acutely worsen.  May consider [place] of kidneys or CT if pain worsening.

## 2013-06-23 LAB — GC/CHLAMYDIA PROBE AMP
CT Probe RNA: NEGATIVE
GC Probe RNA: NEGATIVE

## 2013-06-24 NOTE — Progress Notes (Signed)
Reviewed documentation and xray and agree w/ assessment and plan. Takai Chiaramonte, MD MPH   

## 2013-06-25 LAB — URINE CULTURE
Colony Count: NO GROWTH
Organism ID, Bacteria: NO GROWTH

## 2013-08-25 ENCOUNTER — Other Ambulatory Visit: Payer: Self-pay

## 2014-04-19 ENCOUNTER — Ambulatory Visit (INDEPENDENT_AMBULATORY_CARE_PROVIDER_SITE_OTHER): Payer: BC Managed Care – PPO | Admitting: Emergency Medicine

## 2014-04-19 VITALS — BP 118/64 | HR 91 | Temp 98.2°F | Resp 16 | Ht 64.25 in | Wt 129.4 lb

## 2014-04-19 DIAGNOSIS — J029 Acute pharyngitis, unspecified: Secondary | ICD-10-CM

## 2014-04-19 DIAGNOSIS — L739 Follicular disorder, unspecified: Secondary | ICD-10-CM

## 2014-04-19 DIAGNOSIS — L678 Other hair color and hair shaft abnormalities: Secondary | ICD-10-CM

## 2014-04-19 DIAGNOSIS — R21 Rash and other nonspecific skin eruption: Secondary | ICD-10-CM

## 2014-04-19 DIAGNOSIS — L738 Other specified follicular disorders: Secondary | ICD-10-CM

## 2014-04-19 LAB — CBC WITH DIFFERENTIAL/PLATELET
Basophils Absolute: 0 10*3/uL (ref 0.0–0.1)
Basophils Relative: 0 % (ref 0–1)
Eosinophils Absolute: 0.1 10*3/uL (ref 0.0–0.7)
Eosinophils Relative: 1 % (ref 0–5)
HCT: 38.9 % (ref 36.0–46.0)
Hemoglobin: 13.3 g/dL (ref 12.0–15.0)
Lymphocytes Relative: 18 % (ref 12–46)
Lymphs Abs: 1.9 10*3/uL (ref 0.7–4.0)
MCH: 27.8 pg (ref 26.0–34.0)
MCHC: 34.2 g/dL (ref 30.0–36.0)
MCV: 81.2 fL (ref 78.0–100.0)
Monocytes Absolute: 0.9 10*3/uL (ref 0.1–1.0)
Monocytes Relative: 9 % (ref 3–12)
Neutro Abs: 7.5 10*3/uL (ref 1.7–7.7)
Neutrophils Relative %: 72 % (ref 43–77)
Platelets: 235 10*3/uL (ref 150–400)
RBC: 4.79 MIL/uL (ref 3.87–5.11)
RDW: 13.5 % (ref 11.5–15.5)
WBC: 10.4 10*3/uL (ref 4.0–10.5)

## 2014-04-19 LAB — POCT RAPID STREP A (OFFICE): Rapid Strep A Screen: NEGATIVE

## 2014-04-19 MED ORDER — DOXYCYCLINE HYCLATE 50 MG PO CAPS: 50.0000 mg | ORAL_CAPSULE | Freq: Two times a day (BID) | ORAL | Status: DC

## 2014-04-19 NOTE — Progress Notes (Signed)
Subjective:    Patient ID: Ruth [name], female    DOB: 1980-12-31, 34 y.o.   MRN: [id number]  HPI 33 year old female presents for evaluation of illness and a rash.  States she was visiting the [facility] last week and 2 days after arriving she developed a sore throat, chills, body aches, and subjective fever.  States most of her group had similar symptoms. Admits to slight nasal congestion but no cough.  She soaked in a hotel bath tub and subsequently developed a rash all over her torso and arms.  Admits it looks like acne but she has never had acne "like this."  States it is slightly pruritic but not painful.  She continues to have a sore throat, chills, fatigue, and slight headache.    Review of Systems  Constitutional: Positive for fever (subjective) and chills.  HENT: Positive for sore throat. Negative for congestion, ear pain and rhinorrhea.   Respiratory: Negative for cough.   Gastrointestinal: Negative for nausea and vomiting.  Skin: Positive for rash.  Neurological: Positive for headaches.       Objective:   Physical Exam  Constitutional: She is oriented to person, place, and time. She appears well-developed and well-nourished.  HENT:  Head: Normocephalic and atraumatic.  Right Ear: Hearing, tympanic membrane, external ear and ear canal normal.  Left Ear: Hearing, tympanic membrane, external ear and ear canal normal.  Mouth/Throat: Uvula is midline and mucous membranes are normal. Posterior oropharyngeal erythema present. No oropharyngeal exudate, posterior oropharyngeal edema or tonsillar abscesses.  Eyes: Conjunctivae are normal.  Neck: Normal range of motion. Neck supple.  Cardiovascular: Normal rate, regular rhythm and normal heart sounds.   Pulmonary/Chest: Effort normal and breath sounds normal.  Abdominal: Soft. There is no tenderness. There is no rebound and no guarding.  Lymphadenopathy:    She has no cervical adenopathy.  Neurological: She is alert and oriented  to person, place, and time.  Skin:  Torso has diffuse papules and small pustules. No vesicles, blisters, tenderness, or drainage.   Psychiatric: She has a normal mood and affect. Her behavior is normal. Judgment and thought content normal.   Results for orders placed in visit on 04/19/14  WOUND CULTURE      Result Value Ref Range   Gram Stain No WBC Seen     Gram Stain No Squamous Epithelial Cells Seen     Gram Stain No Organisms Seen     Preliminary Report NO GROWTH 1 DAY    CBC WITH DIFFERENTIAL      Result Value Ref Range   WBC 10.4  4.0 - 10.5 K/uL   RBC 4.79  3.87 - 5.11 MIL/uL   Hemoglobin 13.3  12.0 - 15.0 g/dL   HCT 38.9  36.0 - 46.0 %   MCV 81.2  78.0 - 100.0 fL   MCH 27.8  26.0 - 34.0 pg   MCHC 34.2  30.0 - 36.0 g/dL   RDW 13.5  11.5 - 15.5 %   Platelets 235  150 - 400 K/uL   Neutrophils Relative % 72  43 - 77 %   Neutro Abs 7.5  1.7 - 7.7 K/uL   Lymphocytes Relative 18  12 - 46 %   Lymphs Abs 1.9  0.7 - 4.0 K/uL   Monocytes Relative 9  3 - 12 %   Monocytes Absolute 0.9  0.1 - 1.0 K/uL   Eosinophils Relative 1  0 - 5 %   Eosinophils Absolute 0.1  0.0 - 0.7 K/uL   Basophils Relative 0  0 - 1 %   Basophils Absolute 0.0  0.0 - 0.1 K/uL   Smear Review Criteria for review not met    POCT RAPID STREP A (OFFICE)      Result Value Ref Range   Rapid Strep A Screen Negative  Negative     Wound culture collected from a pustule on her back.      Assessment & Plan:  Acute pharyngitis, unspecified pharyngitis type - Plan: POCT rapid strep A, CBC with Differential, Culture, Group A Strep  Rash and nonspecific skin eruption - Plan: CBC with Differential, Wound culture  Folliculitis - Plan: doxycycline (VIBRAMYCIN) 50 MG capsule  Will treat with doxycycline to cover staph folliculitis. Tick disease unlikely. Wound culture pending - consider addition of cipro to cover hot tub folliculitis, although unlikely. RTC precautions discussed. Follow up if symptoms worsening or fail  to improve.

## 2014-04-21 LAB — WOUND CULTURE
Gram Stain: NONE SEEN
Gram Stain: NONE SEEN
Gram Stain: NONE SEEN
Organism ID, Bacteria: NO GROWTH

## 2014-04-21 LAB — CULTURE, GROUP A STREP: Organism ID, Bacteria: NORMAL

## 2014-04-25 ENCOUNTER — Ambulatory Visit: Payer: BC Managed Care – PPO | Admitting: Cardiovascular Disease

## 2014-10-01 ENCOUNTER — Emergency Department (HOSPITAL_COMMUNITY)
Admission: EM | Admit: 2014-10-01 | Discharge: 2014-10-01 | Disposition: A | Discharge status: Home / Self Care | Payer: BC Managed Care – PPO | Attending: Emergency Medicine | Admitting: Emergency Medicine

## 2014-10-01 ENCOUNTER — Encounter (HOSPITAL_COMMUNITY): Payer: Self-pay | Admitting: Emergency Medicine

## 2014-10-01 DIAGNOSIS — Z87891 Personal history of nicotine dependence: Secondary | ICD-10-CM | POA: Insufficient documentation

## 2014-10-01 DIAGNOSIS — Z792 Long term (current) use of antibiotics: Secondary | ICD-10-CM | POA: Diagnosis not present

## 2014-10-01 DIAGNOSIS — F10129 Alcohol abuse with intoxication, unspecified: Secondary | ICD-10-CM | POA: Diagnosis present

## 2014-10-01 DIAGNOSIS — Z3202 Encounter for pregnancy test, result negative: Secondary | ICD-10-CM | POA: Insufficient documentation

## 2014-10-01 DIAGNOSIS — F10920 Alcohol use, unspecified with intoxication, uncomplicated: Secondary | ICD-10-CM

## 2014-10-01 DIAGNOSIS — F10120 Alcohol abuse with intoxication, uncomplicated: Secondary | ICD-10-CM | POA: Insufficient documentation

## 2014-10-01 DIAGNOSIS — Z8719 Personal history of other diseases of the digestive system: Secondary | ICD-10-CM | POA: Diagnosis not present

## 2014-10-01 LAB — CBC WITH DIFFERENTIAL/PLATELET
Basophils Absolute: 0 10*3/uL (ref 0.0–0.1)
Basophils Relative: 0 % (ref 0–1)
EOS ABS: 0 10*3/uL (ref 0.0–0.7)
Eosinophils Relative: 0 % (ref 0–5)
HCT: 38.2 % (ref 36.0–46.0)
HEMOGLOBIN: 12.5 g/dL (ref 12.0–15.0)
LYMPHS ABS: 2 10*3/uL (ref 0.7–4.0)
Lymphocytes Relative: 15 % (ref 12–46)
MCH: 27.4 pg (ref 26.0–34.0)
MCHC: 32.7 g/dL (ref 30.0–36.0)
MCV: 83.8 fL (ref 78.0–100.0)
MONOS PCT: 6 % (ref 3–12)
Monocytes Absolute: 0.8 10*3/uL (ref 0.1–1.0)
NEUTROS ABS: 10.1 10*3/uL — AB (ref 1.7–7.7)
NEUTROS PCT: 79 % — AB (ref 43–77)
PLATELETS: 249 10*3/uL (ref 150–400)
RBC: 4.56 MIL/uL (ref 3.87–5.11)
RDW: 13 % (ref 11.5–15.5)
WBC: 12.8 10*3/uL — AB (ref 4.0–10.5)

## 2014-10-01 LAB — COMPREHENSIVE METABOLIC PANEL
ALBUMIN: 4 g/dL (ref 3.5–5.2)
ALK PHOS: 91 U/L (ref 39–117)
ALT: 11 U/L (ref 0–35)
AST: 14 U/L (ref 0–37)
Anion gap: 19 — ABNORMAL HIGH (ref 5–15)
BILIRUBIN TOTAL: 0.5 mg/dL (ref 0.3–1.2)
BUN: 7 mg/dL (ref 6–23)
CHLORIDE: 102 meq/L (ref 96–112)
CO2: 20 mEq/L (ref 19–32)
Calcium: 9.3 mg/dL (ref 8.4–10.5)
Creatinine, Ser: 0.57 mg/dL (ref 0.50–1.10)
GFR calc non Af Amer: >90 mL/min (ref >90)
GLUCOSE: 82 mg/dL (ref 70–99)
POTASSIUM: 3.4 meq/L — AB (ref 3.7–5.3)
SODIUM: 141 meq/L (ref 137–147)
TOTAL PROTEIN: 7.7 g/dL (ref 6.0–8.3)

## 2014-10-01 LAB — ETHANOL: Alcohol, Ethyl (B): 173 mg/dL — ABNORMAL HIGH (ref 0–11)

## 2014-10-01 LAB — RAPID URINE DRUG SCREEN, HOSP PERFORMED
Amphetamines: NOT DETECTED
BARBITURATES: NOT DETECTED
BENZODIAZEPINES: NOT DETECTED
COCAINE: NOT DETECTED
Opiates: NOT DETECTED
TETRAHYDROCANNABINOL: NOT DETECTED

## 2014-10-01 LAB — POC URINE PREG, ED: PREG TEST UR: NEGATIVE

## 2014-10-01 MED ORDER — SODIUM CHLORIDE 0.9 % IV BOLUS (SEPSIS)
1000.0000 mL | Freq: Once | INTRAVENOUS | Status: AC
  Administered 2014-10-01: 1000 mL via INTRAVENOUS

## 2014-10-01 MED ORDER — PROMETHAZINE HCL 25 MG PO TABS: 25.0000 mg | ORAL_TABLET | Freq: Four times a day (QID) | ORAL | Status: DC | PRN

## 2014-10-01 NOTE — ED Notes (Signed)
Pt feeling dizzy, low pressure, notified RN

## 2014-10-01 NOTE — Discharge Instructions (Signed)
Please take good care. Return to the ER if your symptoms get worse.  Information on primary care and neurology doctors provided.   Alcohol Intoxication Alcohol intoxication occurs when the amount of alcohol that a person has consumed impairs his or her ability to mentally and physically function. Alcohol directly impairs the normal chemical activity of the brain. Drinking large amounts of alcohol can lead to changes in mental function and behavior, and it can cause many physical effects that can be harmful.  Alcohol intoxication can range in severity from mild to very severe. Various factors can affect the level of intoxication that occurs, such as the person's age, gender, weight, frequency of alcohol consumption, and the presence of other medical conditions (such as diabetes, seizures, or heart conditions). Dangerous levels of alcohol intoxication may occur when people drink large amounts of alcohol in a short period (binge drinking). Alcohol can also be especially dangerous when combined with certain prescription medicines or "recreational" drugs. SIGNS AND SYMPTOMS Some common signs and symptoms of mild alcohol intoxication include:  Loss of coordination.  Changes in mood and behavior.  Impaired judgment.  Slurred speech. As alcohol intoxication progresses to more severe levels, other signs and symptoms will appear. These may include:  Vomiting.  Confusion and impaired memory.  Slowed breathing.  Seizures.  Loss of consciousness. DIAGNOSIS  Your health care provider will take a medical history and perform a physical exam. You will be asked about the amount and type of alcohol you have consumed. Blood tests will be done to measure the concentration of alcohol in your blood. In many places, your blood alcohol level must be lower than 80 mg/dL (0.08%) to legally drive. However, many dangerous effects of alcohol can occur at much lower levels.  TREATMENT  People with alcohol  intoxication often do not require treatment. Most of the effects of alcohol intoxication are temporary, and they go away as the alcohol naturally leaves the body. Your health care provider will monitor your condition until you are stable enough to go home. Fluids are sometimes given through an IV access tube to help prevent dehydration.  HOME CARE INSTRUCTIONS  Do not drive after drinking alcohol.  Stay hydrated. Drink enough water and fluids to keep your urine clear or pale yellow. Avoid caffeine.   Only take over-the-counter or prescription medicines as directed by your health care provider.  SEEK MEDICAL CARE IF:   You have persistent vomiting.   You do not feel better after a few days.  You have frequent alcohol intoxication. Your health care provider can help determine if you should see a substance use treatment counselor. SEEK IMMEDIATE MEDICAL CARE IF:   You become shaky or tremble when you try to stop drinking.   You shake uncontrollably (seizure).   You throw up (vomit) blood. This may be bright red or may look like black coffee grounds.   You have blood in your stool. This may be bright red or may appear as a black, tarry, bad smelling stool.   You become lightheaded or faint.  MAKE SURE YOU:   Understand these instructions.  Will watch your condition.  Will get help right away if you are not doing well or get worse. Document Released: 07/16/2005 Document Revised: 06/08/2013 Document Reviewed: 03/11/2013 [facility] Patient Information 2015 [place], [place]. This information is not intended to replace advice given to you by your health care provider. Make sure you discuss any questions you have with your health care provider.

## 2014-10-01 NOTE — ED Notes (Addendum)
Pt ambulated well, c/o dizziness, also stated she has vertigo and the dizziness isn't abnormal.

## 2014-10-01 NOTE — ED Notes (Signed)
Pt moved from wheel chair to bed at triage. Both rails up on bed. Pt vomited on bed it was cleaned up and towels placed under head and face.

## 2014-10-01 NOTE — ED Notes (Signed)
Pt. presents intoxicated with ETOH , lethargic/somnolent , respirations unlabored .
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
## 2014-10-10 ENCOUNTER — Ambulatory Visit (INDEPENDENT_AMBULATORY_CARE_PROVIDER_SITE_OTHER): Payer: BC Managed Care – PPO | Admitting: Family Medicine

## 2014-10-10 VITALS — BP 100/60 | HR 106 | Temp 98.2°F | Resp 16 | Ht 65.5 in | Wt 134.2 lb

## 2014-10-10 DIAGNOSIS — R110 Nausea: Secondary | ICD-10-CM

## 2014-10-10 DIAGNOSIS — N10 Acute tubulo-interstitial nephritis: Secondary | ICD-10-CM

## 2014-10-10 DIAGNOSIS — R109 Unspecified abdominal pain: Secondary | ICD-10-CM

## 2014-10-10 DIAGNOSIS — R300 Dysuria: Secondary | ICD-10-CM

## 2014-10-10 LAB — POCT CBC
Granulocyte percent: 80.5 %G — AB (ref 37–80)
HEMATOCRIT: 42.5 % (ref 37.7–47.9)
HEMOGLOBIN: 13.5 g/dL (ref 12.2–16.2)
Lymph, poc: 1.2 (ref 0.6–3.4)
MCH: 27.5 pg (ref 27–31.2)
MCHC: 31.9 g/dL (ref 31.8–35.4)
MCV: 86.3 fL (ref 80–97)
MID (cbc): 0.6 (ref 0–0.9)
MPV: 8.8 fL (ref 0–99.8)
POC Granulocyte: 7.4 — AB (ref 2–6.9)
POC LYMPH PERCENT: 13.1 %L (ref 10–50)
POC MID %: 6.4 %M (ref 0–12)
Platelet Count, POC: 226 10*3/uL (ref 142–424)
RBC: 4.92 M/uL (ref 4.04–5.48)
RDW, POC: 14.3 %
WBC: 9.2 10*3/uL (ref 4.6–10.2)

## 2014-10-10 LAB — POCT UA - MICROSCOPIC ONLY
CASTS, UR, LPF, POC: NEGATIVE
CRYSTALS, UR, HPF, POC: NEGATIVE
Mucus, UA: NEGATIVE
Yeast, UA: NEGATIVE

## 2014-10-10 LAB — BASIC METABOLIC PANEL
BUN: 5 mg/dL — AB (ref 6–23)
CHLORIDE: 102 meq/L (ref 96–112)
CO2: 25 meq/L (ref 19–32)
CREATININE: 0.7 mg/dL (ref 0.50–1.10)
Calcium: 9.5 mg/dL (ref 8.4–10.5)
GLUCOSE: 79 mg/dL (ref 70–99)
Potassium: 4.4 mEq/L (ref 3.5–5.3)
Sodium: 136 mEq/L (ref 135–145)

## 2014-10-10 LAB — POCT URINALYSIS DIPSTICK
Bilirubin, UA: NEGATIVE
GLUCOSE UA: NEGATIVE
Ketones, UA: NEGATIVE
NITRITE UA: POSITIVE
Spec Grav, UA: 1.02
UROBILINOGEN UA: 0.2
pH, UA: 6.5

## 2014-10-10 MED ORDER — ONDANSETRON 4 MG PO TBDP: ORAL_TABLET | ORAL | Status: DC

## 2014-10-10 MED ORDER — HYDROCODONE-ACETAMINOPHEN 7.5-325 MG/15ML PO SOLN: ORAL | Status: DC

## 2014-10-10 MED ORDER — CIPROFLOXACIN HCL 500 MG PO TABS: 500.0000 mg | ORAL_TABLET | Freq: Two times a day (BID) | ORAL | Status: DC

## 2014-10-10 NOTE — Patient Instructions (Signed)
Try to stay well hydrated  Take the ciprofloxacin one twice daily  Take Tylenol if needed for fever. However the hydrocodone/acetaminophen solution has Tylenol in it. Maximum Tylenol (acetaminophen) is 3000 mg in 24 hours.  Take hydrocodone syrup 1 or 2 teaspoons every 4-6 hours as needed for pain  Use the ondansetron (Zofran) if needed for nausea  Return or go to the emergency room at any time if acutely worse
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
## 2014-10-15 ENCOUNTER — Other Ambulatory Visit: Payer: Self-pay

## 2014-10-15 DIAGNOSIS — R109 Unspecified abdominal pain: Secondary | ICD-10-CM

## 2014-10-15 NOTE — Telephone Encounter (Signed)
Pt states that she is still in a lot of pain and she wants a refill on her pain med.  Advised pt since she is no better that she needs to come in.  Pt will be back in.

## 2014-10-15 NOTE — Telephone Encounter (Signed)
,  Ruth [name] called to report that Ruth [name] is still in a lot of pain and is requesting another Rx for the liquid hydrocodone.  Ruth [name] states that the patient has two teaspoons left, but is still in a lot of pain.Ruth [name] requests return call at [phone].

## 2014-10-16 NOTE — Telephone Encounter (Signed)
Pt had CB to check status of pain med RF and I had Maudia tell her that it hadn't been reviewed yet. Upon further research I see that Guerry Bruin spoke to pt yesterday and pt had agreed to RTC for re-check. After review of OV notes, I agree that pt needs re-check if her pain has not resolved and I CB and left her a message to RTC as she had agreed upon yesterday.

## 2014-10-16 NOTE — Telephone Encounter (Signed)
Advised Ruth [name] (on HIPPA) that Dr Linna Darner denied RF of hydrocodone elixir w/out OV. D/W her OTC liquid forms (pt has less trouble w/nausea w/liquids) of ibuprofen or tylenol, and Rx strength of ibuprofen if needed.

## 2014-10-16 NOTE — Telephone Encounter (Signed)
Noted  

## 2014-10-16 NOTE — Addendum Note (Signed)
Addended by: Elwyn Reach A on: 10/16/2014 10:31 AM   Modules accepted: Orders

## 2014-10-16 NOTE — Telephone Encounter (Signed)
Katie, pt's emerg contact/friend CB and reported that pt is definitely improving. Her pain is no longer radiating and she no longer has a fever. She has 3-4 more days of Abx Rx and they do feel like it is working, just hasn't completed resolved yet. She reqs enough of a RF of the elixir to cover her for the remainder of Abx treatment in hopes that the pain will be gone by then. Dr Linna Darner, please advise if want to RF. Pended.

## 2015-07-11 ENCOUNTER — Other Ambulatory Visit: Payer: Self-pay | Admitting: Orthopedic Surgery

## 2015-07-11 DIAGNOSIS — R52 Pain, unspecified: Secondary | ICD-10-CM

## 2015-07-12 ENCOUNTER — Ambulatory Visit
Admission: RE | Admit: 2015-07-12 | Discharge: 2015-07-12 | Disposition: A | Discharge status: Home / Self Care | Payer: BLUE CROSS/BLUE SHIELD | Source: Ambulatory Visit | Attending: Orthopedic Surgery | Admitting: Orthopedic Surgery

## 2015-07-12 DIAGNOSIS — R52 Pain, unspecified: Secondary | ICD-10-CM

## 2015-07-16 ENCOUNTER — Ambulatory Visit: Payer: Self-pay | Admitting: Family Medicine

## 2016-03-10 DIAGNOSIS — K6289 Other specified diseases of anus and rectum: Secondary | ICD-10-CM | POA: Diagnosis not present

## 2016-03-10 DIAGNOSIS — K625 Hemorrhage of anus and rectum: Secondary | ICD-10-CM | POA: Diagnosis not present

## 2016-03-10 DIAGNOSIS — K602 Anal fissure, unspecified: Secondary | ICD-10-CM | POA: Diagnosis not present

## 2016-03-10 DIAGNOSIS — Z87891 Personal history of nicotine dependence: Secondary | ICD-10-CM | POA: Diagnosis not present

## 2016-03-15 ENCOUNTER — Encounter (HOSPITAL_COMMUNITY): Payer: Self-pay | Admitting: *Deleted

## 2016-03-15 ENCOUNTER — Emergency Department (HOSPITAL_COMMUNITY)
Admission: EM | Admit: 2016-03-15 | Discharge: 2016-03-15 | Disposition: A | Discharge status: Home / Self Care | Payer: BLUE CROSS/BLUE SHIELD | Attending: Emergency Medicine | Admitting: Emergency Medicine

## 2016-03-15 ENCOUNTER — Emergency Department (HOSPITAL_COMMUNITY): Payer: BLUE CROSS/BLUE SHIELD

## 2016-03-15 DIAGNOSIS — Z87891 Personal history of nicotine dependence: Secondary | ICD-10-CM | POA: Diagnosis not present

## 2016-03-15 DIAGNOSIS — N12 Tubulo-interstitial nephritis, not specified as acute or chronic: Secondary | ICD-10-CM

## 2016-03-15 DIAGNOSIS — R109 Unspecified abdominal pain: Secondary | ICD-10-CM | POA: Diagnosis not present

## 2016-03-15 LAB — URINALYSIS, ROUTINE W REFLEX MICROSCOPIC
Bilirubin Urine: NEGATIVE
GLUCOSE, UA: NEGATIVE mg/dL
Ketones, ur: NEGATIVE mg/dL
NITRITE: NEGATIVE
PH: 6.5 (ref 5.0–8.0)
Protein, ur: 30 mg/dL — AB
SPECIFIC GRAVITY, URINE: 1.003 — AB (ref 1.005–1.030)

## 2016-03-15 LAB — CBC
HEMATOCRIT: 40 % (ref 36.0–46.0)
Hemoglobin: 13.4 g/dL (ref 12.0–15.0)
MCH: 28.5 pg (ref 26.0–34.0)
MCHC: 33.5 g/dL (ref 30.0–36.0)
MCV: 84.9 fL (ref 78.0–100.0)
Platelets: 250 10*3/uL (ref 150–400)
RBC: 4.71 MIL/uL (ref 3.87–5.11)
RDW: 13 % (ref 11.5–15.5)
WBC: 16.3 10*3/uL — AB (ref 4.0–10.5)

## 2016-03-15 LAB — COMPREHENSIVE METABOLIC PANEL
ALT: 16 U/L (ref 14–54)
AST: 21 U/L (ref 15–41)
Albumin: 4.4 g/dL (ref 3.5–5.0)
Alkaline Phosphatase: 73 U/L (ref 38–126)
Anion gap: 8 (ref 5–15)
BILIRUBIN TOTAL: 0.8 mg/dL (ref 0.3–1.2)
BUN: 7 mg/dL (ref 6–20)
CALCIUM: 9.2 mg/dL (ref 8.9–10.3)
CO2: 24 mmol/L (ref 22–32)
CREATININE: 0.66 mg/dL (ref 0.44–1.00)
Chloride: 103 mmol/L (ref 101–111)
Glucose, Bld: 85 mg/dL (ref 65–99)
Potassium: 3.7 mmol/L (ref 3.5–5.1)
Sodium: 135 mmol/L (ref 135–145)
TOTAL PROTEIN: 8 g/dL (ref 6.5–8.1)

## 2016-03-15 LAB — URINE MICROSCOPIC-ADD ON

## 2016-03-15 LAB — PREGNANCY, URINE: Preg Test, Ur: NEGATIVE

## 2016-03-15 MED ORDER — SODIUM CHLORIDE 0.9 % IV BOLUS (SEPSIS)
1000.0000 mL | Freq: Once | INTRAVENOUS | Status: AC
  Administered 2016-03-15: 1000 mL via INTRAVENOUS

## 2016-03-15 MED ORDER — ONDANSETRON HCL 4 MG/2ML IJ SOLN
4.0000 mg | Freq: Once | INTRAMUSCULAR | Status: AC
  Administered 2016-03-15: 4 mg via INTRAVENOUS
  Filled 2016-03-15: qty 2

## 2016-03-15 MED ORDER — SODIUM CHLORIDE 0.9 % IV SOLN
1000.0000 mL | Freq: Once | INTRAVENOUS | Status: AC
  Administered 2016-03-15: 1000 mL via INTRAVENOUS

## 2016-03-15 MED ORDER — DEXTROSE 5 % IV SOLN
1.0000 g | Freq: Once | INTRAVENOUS | Status: AC
  Administered 2016-03-15: 1 g via INTRAVENOUS
  Filled 2016-03-15: qty 10

## 2016-03-15 MED ORDER — CEPHALEXIN 500 MG PO CAPS: 500.0000 mg | ORAL_CAPSULE | Freq: Three times a day (TID) | ORAL | Status: DC

## 2016-03-15 MED ORDER — SODIUM CHLORIDE 0.9 % IV SOLN: 1000.0000 mL | INTRAVENOUS | Status: DC

## 2016-03-15 MED ORDER — KETOROLAC TROMETHAMINE 30 MG/ML IJ SOLN
30.0000 mg | Freq: Once | INTRAMUSCULAR | Status: AC
  Administered 2016-03-15: 30 mg via INTRAVENOUS
  Filled 2016-03-15: qty 1

## 2016-03-15 MED ORDER — HYDROMORPHONE HCL 1 MG/ML IJ SOLN
1.0000 mg | Freq: Once | INTRAMUSCULAR | Status: AC
  Administered 2016-03-15: 1 mg via INTRAVENOUS
  Filled 2016-03-15: qty 1

## 2016-03-15 MED ORDER — OXYCODONE-ACETAMINOPHEN 5-325 MG PO TABS
1.0000 | ORAL_TABLET | Freq: Once | ORAL | Status: AC
  Administered 2016-03-15: 1 via ORAL
  Filled 2016-03-15: qty 1

## 2016-03-15 MED ORDER — ONDANSETRON 8 MG PO TBDP: 8.0000 mg | ORAL_TABLET | Freq: Three times a day (TID) | ORAL | Status: DC | PRN

## 2016-03-15 MED ORDER — OXYCODONE-ACETAMINOPHEN 5-325 MG PO TABS: 1.0000 | ORAL_TABLET | ORAL | Status: DC | PRN

## 2016-03-15 NOTE — Discharge Instructions (Signed)

## 2016-03-15 NOTE — ED Notes (Signed)
MD at bedside. 

## 2016-03-15 NOTE — ED Notes (Signed)
Pt c/o right flank pain with painful urination and freq x 1 week

## 2016-03-15 NOTE — ED Provider Notes (Signed)
CSN: [id number]     Arrival date & time 03/15/16  [id number] History   First MD Initiated Contact with Patient 03/15/16 0720     Chief Complaint  Patient presents with  . Flank Pain     HPI Patient presents to the emergency department with complaints of dysuria and urinary frequency with now developing right flank pain over the past 24 hours.  Her initial dysuria and urinary frequency began 1 week ago.  She's been trying fluids without improvement in her symptoms.  She has not seen a medical provider.  She is not currently on antibiotics.  She reports chills with subjective fever at home over the past 24 hours.  Nausea without vomiting.  Denies diarrhea.  Denies abdominal pain but does report some right flank pain.  No history of kidney stones.  Her pain is 6 out of 10 in severity at this time    Past Medical History  Diagnosis Date  . GERD (gastroesophageal reflux disease)     no meds  . PONV (postoperative nausea and vomiting)   . Frequency-urgency sydrome   . Allergy    Past Surgical History  Procedure Laterality Date  . Nasal septoplasty  '11  . Wisdom tooth extraction    . Laparoscopy  04/19/2012    Procedure: LAPAROSCOPY DIAGNOSTIC;  Surgeon: Selinda Orion, MD;  Location: [facility];  Service: Gynecology;  Laterality: N/A;  left ovarian cystectomy with excision of BENIGN l cystic 7.6cm left teratoma WITH ENDO CATCH REMOVAL and cauterization of endometriosis   History reviewed. No pertinent family history. Social History  Substance Use Topics  . Smoking status: Former Smoker -- 1.00 packs/day    Quit date: 04/12/2004  . Smokeless tobacco: None  . Alcohol Use: Yes   OB History    No data available     Review of Systems  All other systems reviewed and are negative.     Allergies  Lactose intolerance (gi) and Whey protein  Home Medications   Prior to Admission medications   Medication Sig Start Date End Date Taking? Authorizing Provider  ALPRAZolam  [name]) 1 MG tablet Take 0.5 mg by mouth at bedtime.   Yes Historical Provider, MD  lidocaine (XYLOCAINE) 5 % ointment Apply 1 application topically 2 (two) times daily as needed for moderate pain (FOR PROCEDURE PAIN (COLONOSCOPY)).   Yes Historical Provider, MD  Multiple Vitamin (MULTIVITAMIN WITH MINERALS) TABS tablet Take 1 tablet by mouth daily.   Yes Historical Provider, MD  naproxen sodium (RA NAPROXEN SODIUM) 220 MG tablet Take 220 mg by mouth.   Yes Historical Provider, MD  cephALEXin (KEFLEX) 500 MG capsule Take 1 capsule (500 mg total) by mouth 3 (three) times daily. 03/15/16   Jola Schmidt, MD  ondansetron (ZOFRAN ODT) 8 MG disintegrating tablet Take 1 tablet (8 mg total) by mouth every 8 (eight) hours as needed for nausea or vomiting. 03/15/16   Jola Schmidt, MD  oxyCODONE-acetaminophen (PERCOCET/ROXICET) 5-325 MG tablet Take 1 tablet by mouth every 4 (four) hours as needed for severe pain. 03/15/16   Jola Schmidt, MD   BP 125/91 mmHg  Pulse 122  Temp(Src) 99.5 F (37.5 C) (Oral)  Resp 18  Ht 5\' 5"  (1.651 m)  Wt 140 lb (63.504 kg)  BMI 23.30 kg/m2  SpO2 99%  LMP 03/08/2016 Physical Exam  Constitutional: She is oriented to person, place, and time. She appears well-developed and well-nourished. No distress.  HENT:  Head: Normocephalic and atraumatic.  Eyes: EOM  are normal.  Neck: Normal range of motion.  Cardiovascular: Normal rate, regular rhythm and normal heart sounds.   Pulmonary/Chest: Effort normal and breath sounds normal.  Abdominal: Soft. She exhibits no distension. There is no tenderness.  Genitourinary:  Mild right CVA tenderness  Musculoskeletal: Normal range of motion.  Neurological: She is alert and oriented to person, place, and time.  Skin: Skin is warm and dry.  Psychiatric: She has a normal mood and affect. Judgment normal.  Nursing note and vitals reviewed.   ED Course  Procedures (including critical care time) Labs Review Labs Reviewed  URINALYSIS,  ROUTINE W REFLEX MICROSCOPIC (NOT AT [facility]) - Abnormal; Notable for the following:    APPearance CLOUDY (*)    Specific Gravity, Urine 1.003 (*)    Hgb urine dipstick LARGE (*)    Protein, ur 30 (*)    Leukocytes, UA LARGE (*)    All other components within normal limits  CBC - Abnormal; Notable for the following:    WBC 16.3 (*)    All other components within normal limits  URINE MICROSCOPIC-ADD ON - Abnormal; Notable for the following:    Squamous Epithelial / LPF 0-5 (*)    Bacteria, UA MANY (*)    All other components within normal limits  URINE CULTURE  PREGNANCY, URINE  COMPREHENSIVE METABOLIC PANEL    Imaging Review Ct Renal Stone Study  03/15/2016  CLINICAL DATA:  35 year old female with history of right-sided flank pain headache painful urination with increased urinary frequency for 1 week. EXAM: CT ABDOMEN AND PELVIS WITHOUT CONTRAST TECHNIQUE: Multidetector CT imaging of the abdomen and pelvis was performed following the standard protocol without IV contrast. COMPARISON:  No priors. FINDINGS: Lower chest:  Unremarkable. Hepatobiliary: No definite cystic or solid hepatic lesions are identified on today's noncontrast CT examination. Unenhanced appearance of the gallbladder is normal. Pancreas: No pancreatic mass or peripancreatic inflammatory changes on today's noncontrast CT examination. Spleen: Unremarkable. Adrenals/Urinary Tract: No calcifications are identified in the collecting system of either kidney, along the course of either ureter, or within the lumen of the urinary bladder. There is very mild fullness of the right ureter and right renal collecting system with extensive right-sided periureteric stranding and haziness in the peripelvic fat adjacent to the right renal collecting system, which could be seen in the setting of ascending urinary tract infection and pyelonephritis. Left kidney is normal in appearance. Urinary bladder is unremarkable in appearance. Bilateral adrenal  glands are normal in appearance. Stomach/Bowel: The appearance of the stomach is normal. There is no pathologic dilatation of small bowel or colon. Normal appendix. Vascular/Lymphatic: No atherosclerotic calcifications or definite aneurysm identified in the abdominal or pelvic vasculature. No lymphadenopathy noted in the abdomen or pelvis. Reproductive: Uterus and ovaries are unremarkable in appearance. Other: No significant volume of ascites.  No pneumoperitoneum. Musculoskeletal: There are no aggressive appearing lytic or blastic lesions noted in the visualized portions of the skeleton. IMPRESSION: 1. No urinary tract calculi. 2. There are subtle findings right ureter and right renal collecting system which could be seen in the setting of ascending urinary tract infection and pyelonephritis. Clinical correlation is recommended. 3. Normal appendix. Electronically Signed   By: Vinnie Langton M.D.   On: 03/15/2016 08:27   I have personally reviewed and evaluated these images and lab results as part of my medical decision-making.   EKG Interpretation None      MDM   Final diagnoses:  Pyelonephritis    Right-sided pyelonephritis.  No  evidence of ureteral obstruction.  Patient is feeling better at time of discharge.  Rocephin given in the emergency department.  Urine culture sent.  Home with antinausea medicine, pain medicine, Keflex.  She understands to return to the ER for new or worsening symptoms.  Culture pending.  A history of recurrent urinary traction infections and therefore she will be referred to urology for additional workup    Jola Schmidt, MD 03/15/16 1410

## 2016-03-17 LAB — URINE CULTURE: Culture: >=100000 — AB

## 2016-03-18 ENCOUNTER — Telehealth (HOSPITAL_BASED_OUTPATIENT_CLINIC_OR_DEPARTMENT_OTHER): Payer: Self-pay | Admitting: Emergency Medicine

## 2016-03-18 DIAGNOSIS — K625 Hemorrhage of anus and rectum: Secondary | ICD-10-CM | POA: Insufficient documentation

## 2016-03-18 DIAGNOSIS — K6289 Other specified diseases of anus and rectum: Secondary | ICD-10-CM | POA: Insufficient documentation

## 2016-03-18 NOTE — Telephone Encounter (Signed)
Post ED Visit - Positive Culture Follow-up  Culture report reviewed by antimicrobial stewardship pharmacist:  []  Elenor Quinones, Pharm.D. []  Heide Guile, Pharm.D., BCPS []  Parks Neptune, Pharm.D. []  Alycia Rossetti, Pharm.D., BCPS []  [place], Pharm.D., BCPS, AAHIVP []  Legrand Como, Pharm.D., BCPS, AAHIVP [x]  Cassie Stewart, Pharm.D. []  Stephens November, [id number].  Positive urine culture Treated with cephalexin, organism sensitive to the same and no further patient follow-up is required at this time.  Hazle Nordmann 03/18/2016, 9:38 AM

## 2016-03-19 DIAGNOSIS — Z0000 Encounter for general adult medical examination without abnormal findings: Secondary | ICD-10-CM | POA: Diagnosis not present

## 2016-03-19 DIAGNOSIS — R3989 Other symptoms and signs involving the genitourinary system: Secondary | ICD-10-CM | POA: Diagnosis not present

## 2016-04-03 DIAGNOSIS — M65322 Trigger finger, left index finger: Secondary | ICD-10-CM | POA: Diagnosis not present

## 2016-06-10 DIAGNOSIS — J3089 Other allergic rhinitis: Secondary | ICD-10-CM | POA: Diagnosis not present

## 2016-06-10 DIAGNOSIS — J3081 Allergic rhinitis due to animal (cat) (dog) hair and dander: Secondary | ICD-10-CM | POA: Diagnosis not present

## 2016-06-10 DIAGNOSIS — J301 Allergic rhinitis due to pollen: Secondary | ICD-10-CM | POA: Diagnosis not present

## 2016-06-25 DIAGNOSIS — J301 Allergic rhinitis due to pollen: Secondary | ICD-10-CM | POA: Diagnosis not present

## 2016-06-25 DIAGNOSIS — J3089 Other allergic rhinitis: Secondary | ICD-10-CM | POA: Diagnosis not present

## 2016-06-25 DIAGNOSIS — J3081 Allergic rhinitis due to animal (cat) (dog) hair and dander: Secondary | ICD-10-CM | POA: Diagnosis not present

## 2016-06-27 DIAGNOSIS — J3081 Allergic rhinitis due to animal (cat) (dog) hair and dander: Secondary | ICD-10-CM | POA: Diagnosis not present

## 2016-06-27 DIAGNOSIS — J301 Allergic rhinitis due to pollen: Secondary | ICD-10-CM | POA: Diagnosis not present

## 2016-06-27 DIAGNOSIS — J3089 Other allergic rhinitis: Secondary | ICD-10-CM | POA: Diagnosis not present

## 2016-06-30 DIAGNOSIS — J301 Allergic rhinitis due to pollen: Secondary | ICD-10-CM | POA: Diagnosis not present

## 2016-06-30 DIAGNOSIS — J3089 Other allergic rhinitis: Secondary | ICD-10-CM | POA: Diagnosis not present

## 2016-06-30 DIAGNOSIS — J3081 Allergic rhinitis due to animal (cat) (dog) hair and dander: Secondary | ICD-10-CM | POA: Diagnosis not present

## 2016-07-03 DIAGNOSIS — J3089 Other allergic rhinitis: Secondary | ICD-10-CM | POA: Diagnosis not present

## 2016-07-03 DIAGNOSIS — J3081 Allergic rhinitis due to animal (cat) (dog) hair and dander: Secondary | ICD-10-CM | POA: Diagnosis not present

## 2016-07-03 DIAGNOSIS — J301 Allergic rhinitis due to pollen: Secondary | ICD-10-CM | POA: Diagnosis not present

## 2016-07-09 DIAGNOSIS — J301 Allergic rhinitis due to pollen: Secondary | ICD-10-CM | POA: Diagnosis not present

## 2016-07-09 DIAGNOSIS — J3089 Other allergic rhinitis: Secondary | ICD-10-CM | POA: Diagnosis not present

## 2016-07-09 DIAGNOSIS — J3081 Allergic rhinitis due to animal (cat) (dog) hair and dander: Secondary | ICD-10-CM | POA: Diagnosis not present

## 2016-07-11 DIAGNOSIS — J3081 Allergic rhinitis due to animal (cat) (dog) hair and dander: Secondary | ICD-10-CM | POA: Diagnosis not present

## 2016-07-11 DIAGNOSIS — J301 Allergic rhinitis due to pollen: Secondary | ICD-10-CM | POA: Diagnosis not present

## 2016-07-11 DIAGNOSIS — J3089 Other allergic rhinitis: Secondary | ICD-10-CM | POA: Diagnosis not present

## 2016-07-17 DIAGNOSIS — J3089 Other allergic rhinitis: Secondary | ICD-10-CM | POA: Diagnosis not present

## 2016-07-17 DIAGNOSIS — J301 Allergic rhinitis due to pollen: Secondary | ICD-10-CM | POA: Diagnosis not present

## 2016-07-17 DIAGNOSIS — J3081 Allergic rhinitis due to animal (cat) (dog) hair and dander: Secondary | ICD-10-CM | POA: Diagnosis not present

## 2016-07-23 DIAGNOSIS — J301 Allergic rhinitis due to pollen: Secondary | ICD-10-CM | POA: Diagnosis not present

## 2016-07-23 DIAGNOSIS — J3081 Allergic rhinitis due to animal (cat) (dog) hair and dander: Secondary | ICD-10-CM | POA: Diagnosis not present

## 2016-07-23 DIAGNOSIS — J3089 Other allergic rhinitis: Secondary | ICD-10-CM | POA: Diagnosis not present

## 2016-08-06 DIAGNOSIS — J301 Allergic rhinitis due to pollen: Secondary | ICD-10-CM | POA: Diagnosis not present

## 2016-08-06 DIAGNOSIS — J3081 Allergic rhinitis due to animal (cat) (dog) hair and dander: Secondary | ICD-10-CM | POA: Diagnosis not present

## 2016-08-06 DIAGNOSIS — J3089 Other allergic rhinitis: Secondary | ICD-10-CM | POA: Diagnosis not present

## 2016-08-13 DIAGNOSIS — J3089 Other allergic rhinitis: Secondary | ICD-10-CM | POA: Diagnosis not present

## 2016-08-13 DIAGNOSIS — J301 Allergic rhinitis due to pollen: Secondary | ICD-10-CM | POA: Diagnosis not present

## 2016-08-13 DIAGNOSIS — J3081 Allergic rhinitis due to animal (cat) (dog) hair and dander: Secondary | ICD-10-CM | POA: Diagnosis not present

## 2016-08-20 DIAGNOSIS — J301 Allergic rhinitis due to pollen: Secondary | ICD-10-CM | POA: Diagnosis not present

## 2016-08-20 DIAGNOSIS — J3089 Other allergic rhinitis: Secondary | ICD-10-CM | POA: Diagnosis not present

## 2016-08-20 DIAGNOSIS — J3081 Allergic rhinitis due to animal (cat) (dog) hair and dander: Secondary | ICD-10-CM | POA: Diagnosis not present

## 2016-09-15 DIAGNOSIS — J3081 Allergic rhinitis due to animal (cat) (dog) hair and dander: Secondary | ICD-10-CM | POA: Diagnosis not present

## 2016-09-15 DIAGNOSIS — J301 Allergic rhinitis due to pollen: Secondary | ICD-10-CM | POA: Diagnosis not present

## 2016-09-15 DIAGNOSIS — J3089 Other allergic rhinitis: Secondary | ICD-10-CM | POA: Diagnosis not present

## 2016-10-01 DIAGNOSIS — J3089 Other allergic rhinitis: Secondary | ICD-10-CM | POA: Diagnosis not present

## 2016-10-01 DIAGNOSIS — J3081 Allergic rhinitis due to animal (cat) (dog) hair and dander: Secondary | ICD-10-CM | POA: Diagnosis not present

## 2016-10-01 DIAGNOSIS — J301 Allergic rhinitis due to pollen: Secondary | ICD-10-CM | POA: Diagnosis not present

## 2016-10-06 DIAGNOSIS — J3089 Other allergic rhinitis: Secondary | ICD-10-CM | POA: Diagnosis not present

## 2016-10-06 DIAGNOSIS — J3081 Allergic rhinitis due to animal (cat) (dog) hair and dander: Secondary | ICD-10-CM | POA: Diagnosis not present

## 2016-10-06 DIAGNOSIS — J301 Allergic rhinitis due to pollen: Secondary | ICD-10-CM | POA: Diagnosis not present

## 2016-10-10 DIAGNOSIS — J301 Allergic rhinitis due to pollen: Secondary | ICD-10-CM | POA: Diagnosis not present

## 2016-10-10 DIAGNOSIS — J3081 Allergic rhinitis due to animal (cat) (dog) hair and dander: Secondary | ICD-10-CM | POA: Diagnosis not present

## 2016-10-10 DIAGNOSIS — J3089 Other allergic rhinitis: Secondary | ICD-10-CM | POA: Diagnosis not present

## 2016-10-15 DIAGNOSIS — J3081 Allergic rhinitis due to animal (cat) (dog) hair and dander: Secondary | ICD-10-CM | POA: Diagnosis not present

## 2016-10-15 DIAGNOSIS — J301 Allergic rhinitis due to pollen: Secondary | ICD-10-CM | POA: Diagnosis not present

## 2016-10-15 DIAGNOSIS — J3089 Other allergic rhinitis: Secondary | ICD-10-CM | POA: Diagnosis not present

## 2016-10-22 DIAGNOSIS — J3081 Allergic rhinitis due to animal (cat) (dog) hair and dander: Secondary | ICD-10-CM | POA: Diagnosis not present

## 2016-10-22 DIAGNOSIS — J3089 Other allergic rhinitis: Secondary | ICD-10-CM | POA: Diagnosis not present

## 2016-10-22 DIAGNOSIS — J301 Allergic rhinitis due to pollen: Secondary | ICD-10-CM | POA: Diagnosis not present

## 2016-11-11 DIAGNOSIS — J301 Allergic rhinitis due to pollen: Secondary | ICD-10-CM | POA: Diagnosis not present

## 2016-11-11 DIAGNOSIS — J3089 Other allergic rhinitis: Secondary | ICD-10-CM | POA: Diagnosis not present

## 2016-11-11 DIAGNOSIS — J3081 Allergic rhinitis due to animal (cat) (dog) hair and dander: Secondary | ICD-10-CM | POA: Diagnosis not present

## 2016-11-13 DIAGNOSIS — Z23 Encounter for immunization: Secondary | ICD-10-CM | POA: Diagnosis not present

## 2016-11-14 DIAGNOSIS — J301 Allergic rhinitis due to pollen: Secondary | ICD-10-CM | POA: Diagnosis not present

## 2016-11-14 DIAGNOSIS — J3081 Allergic rhinitis due to animal (cat) (dog) hair and dander: Secondary | ICD-10-CM | POA: Diagnosis not present

## 2016-11-17 DIAGNOSIS — J3089 Other allergic rhinitis: Secondary | ICD-10-CM | POA: Diagnosis not present

## 2016-11-18 DIAGNOSIS — J3089 Other allergic rhinitis: Secondary | ICD-10-CM | POA: Diagnosis not present

## 2016-11-18 DIAGNOSIS — J301 Allergic rhinitis due to pollen: Secondary | ICD-10-CM | POA: Diagnosis not present

## 2016-11-18 DIAGNOSIS — L659 Nonscarring hair loss, unspecified: Secondary | ICD-10-CM | POA: Diagnosis not present

## 2016-11-18 DIAGNOSIS — J3081 Allergic rhinitis due to animal (cat) (dog) hair and dander: Secondary | ICD-10-CM | POA: Diagnosis not present

## 2016-11-18 DIAGNOSIS — L650 Telogen effluvium: Secondary | ICD-10-CM | POA: Diagnosis not present

## 2016-11-18 DIAGNOSIS — L719 Rosacea, unspecified: Secondary | ICD-10-CM | POA: Diagnosis not present

## 2016-11-18 DIAGNOSIS — L658 Other specified nonscarring hair loss: Secondary | ICD-10-CM | POA: Diagnosis not present

## 2016-11-26 DIAGNOSIS — J3081 Allergic rhinitis due to animal (cat) (dog) hair and dander: Secondary | ICD-10-CM | POA: Diagnosis not present

## 2016-11-26 DIAGNOSIS — J3089 Other allergic rhinitis: Secondary | ICD-10-CM | POA: Diagnosis not present

## 2016-11-26 DIAGNOSIS — J301 Allergic rhinitis due to pollen: Secondary | ICD-10-CM | POA: Diagnosis not present

## 2016-12-02 DIAGNOSIS — J3089 Other allergic rhinitis: Secondary | ICD-10-CM | POA: Diagnosis not present

## 2016-12-02 DIAGNOSIS — J3081 Allergic rhinitis due to animal (cat) (dog) hair and dander: Secondary | ICD-10-CM | POA: Diagnosis not present

## 2016-12-02 DIAGNOSIS — J301 Allergic rhinitis due to pollen: Secondary | ICD-10-CM | POA: Diagnosis not present

## 2016-12-16 DIAGNOSIS — J3089 Other allergic rhinitis: Secondary | ICD-10-CM | POA: Diagnosis not present

## 2016-12-16 DIAGNOSIS — J3081 Allergic rhinitis due to animal (cat) (dog) hair and dander: Secondary | ICD-10-CM | POA: Diagnosis not present

## 2016-12-16 DIAGNOSIS — J301 Allergic rhinitis due to pollen: Secondary | ICD-10-CM | POA: Diagnosis not present

## 2016-12-17 DIAGNOSIS — J3081 Allergic rhinitis due to animal (cat) (dog) hair and dander: Secondary | ICD-10-CM | POA: Diagnosis not present

## 2016-12-17 DIAGNOSIS — H1045 Other chronic allergic conjunctivitis: Secondary | ICD-10-CM | POA: Diagnosis not present

## 2016-12-17 DIAGNOSIS — J4530 Mild persistent asthma, uncomplicated: Secondary | ICD-10-CM | POA: Diagnosis not present

## 2016-12-17 DIAGNOSIS — J3089 Other allergic rhinitis: Secondary | ICD-10-CM | POA: Diagnosis not present

## 2016-12-29 ENCOUNTER — Encounter: Payer: Self-pay | Admitting: Family Medicine

## 2016-12-29 ENCOUNTER — Ambulatory Visit (INDEPENDENT_AMBULATORY_CARE_PROVIDER_SITE_OTHER)
Admission: RE | Admit: 2016-12-29 | Discharge: 2016-12-29 | Disposition: A | Discharge status: Home / Self Care | Payer: BLUE CROSS/BLUE SHIELD | Source: Ambulatory Visit | Attending: Family Medicine | Admitting: Family Medicine

## 2016-12-29 ENCOUNTER — Ambulatory Visit (INDEPENDENT_AMBULATORY_CARE_PROVIDER_SITE_OTHER): Payer: BLUE CROSS/BLUE SHIELD | Admitting: Family Medicine

## 2016-12-29 VITALS — BP 104/76 | HR 98 | Ht 64.0 in | Wt 142.0 lb

## 2016-12-29 DIAGNOSIS — M542 Cervicalgia: Secondary | ICD-10-CM

## 2016-12-29 DIAGNOSIS — G894 Chronic pain syndrome: Secondary | ICD-10-CM

## 2016-12-29 DIAGNOSIS — M999 Biomechanical lesion, unspecified: Secondary | ICD-10-CM

## 2016-12-29 DIAGNOSIS — G8929 Other chronic pain: Secondary | ICD-10-CM | POA: Diagnosis not present

## 2016-12-29 MED ORDER — VENLAFAXINE HCL ER 37.5 MG PO CP24: 37.5000 mg | ORAL_CAPSULE | Freq: Every day | ORAL | 1 refills | Status: DC

## 2016-12-29 MED ORDER — VITAMIN D (ERGOCALCIFEROL) 1.25 MG (50000 UNIT) PO CAPS: 50000.0000 [IU] | ORAL_CAPSULE | ORAL | 0 refills | Status: DC

## 2016-12-29 MED ORDER — TRAMADOL HCL 50 MG PO TABS: 50.0000 mg | ORAL_TABLET | Freq: Every evening | ORAL | 0 refills | Status: DC | PRN

## 2016-12-29 NOTE — Assessment & Plan Note (Signed)
Patient is in severe, chronic neck pain. I do think that this is contributing to most of the discomfort and pain. We discussed with patient at great length. Patient will do more icing, home exercises and patient work with Product/process development scientist. Medications include Effexor and vitamin D. Discussed also laboratory workup for the chronic pain leg syndrome. Depending on findings this could change medical management. Patient come back and see me again in 2-3 weeks.

## 2016-12-29 NOTE — Progress Notes (Signed)
[name] [place], [place] [address] Phone: [phone] Subjective:    I'm seeing this patient by the request  of:    CC: Neck pain and body pain   [id number]  Ruth [name] is a 36 y.o. female coming in with complaint of neck pain and body pain. Patient has had this for quite some time. Patient was diagnosed with a teratoma long time ago and did have abdominal surgery. Since then continues to have significant amount of pain and is debilitating. Patient has been seen multiple providers for this and has been diagnosed with degenerative disc disease she states. Patient has had multiple different complaints Limited muscular skeletal issues over the course the years. Nobody is been able to find out why is everything continuing the give her trouble. Patient rates the severity of pain a 7 out of 10. States that this is constant 10 pain sometimes. Patient states once to start seems to be lifting. Denies any numbness but states that she feels she has weakness of the upper extremities. Patient states that she also has back pain. Patient states any type of regimen has not been very beneficial. Anything else for short-term and then seems to get worse again. Pain medications have been helpful in the past but patient does not know if she wants to be on them regularly.     Past Medical History:  Diagnosis Date  . Allergy   . Frequency-urgency sydrome   . GERD (gastroesophageal reflux disease)    no meds  . PONV (postoperative nausea and vomiting)    Past Surgical History:  Procedure Laterality Date  . LAPAROSCOPY  04/19/2012   Procedure: LAPAROSCOPY DIAGNOSTIC;  Surgeon: Selinda Orion, MD;  Location: [facility];  Service: Gynecology;  Laterality: N/A;  left ovarian cystectomy with excision of BENIGN l cystic 7.6cm left teratoma WITH ENDO CATCH REMOVAL and cauterization of endometriosis  . nasal septoplasty  '11  . WISDOM TOOTH EXTRACTION      Social History   Social History  . Marital status: Single    Spouse name: N/A  . Number of children: N/A  . Years of education: N/A   Social History Main Topics  . Smoking status: Former Smoker    Packs/day: 1.00    Quit date: 04/12/2004  . Smokeless tobacco: Never Used  . Alcohol use Yes  . Drug use: Unknown  . Sexual activity: No   Other Topics Concern  . None   Social History Narrative  . None   Allergies  Allergen Reactions  . Lactose Intolerance (Gi) Hives    ANY MILK  BASED PROTEIN  . Whey Protein [Protein] Hives   History reviewed. No pertinent family history.  Past medical history, social, surgical and family history all reviewed in electronic medical record.  No pertanent information unless stated regarding to the chief complaint.   Review of Systems: No headache, visual changes, nausea, vomiting, diarrhea, constipation, dizziness, abdominal pain, skin rash, fevers, chills, night sweats, weight loss, swollen lymph nodes, body aches, joint swelling, muscle aches, chest pain, shortness of breath, mood changes.    Objective  Blood pressure 104/76, pulse 98, height 5\' 4"  (1.626 m), weight 142 lb (64.4 kg), last menstrual period 12/07/2016, SpO2 98 %. Systems examined below as of 12/29/16   General: No apparent distress alert and oriented x3 mood and affect normal, dressed appropriately.  HEENT: Pupils equal, extraocular movements intact  Respiratory: Patient's speak in full sentences  and does not appear short of breath  Cardiovascular: No lower extremity edema, non tender, no erythema  Skin: Warm dry intact with no signs of infection or rash on extremities or on axial skeleton.  Abdomen: Soft nontender  Neuro: Cranial nerves II through XII are intact, neurovascularly intact in all extremities with 2+ DTRs and 2+ pulses.  Lymph: No lymphadenopathy of posterior or anterior cervical chain or axillae bilaterally.  Gait normal with good balance and coordination.    MSK:  tender with full range of motion and good stability and symmetric strength and tone of shoulders, elbows, wrist, hip, knee and ankles bilaterally. Severely poor posture. Pain seems to be out of proportion to the amount of palpation Neck: Inspection shows the patient does have decreasing lordosis severely poor scapular strength. No palpable stepoffs. Negative Spurling's maneuver. Mild limitation lacking the last 10 of extension. Patient also lacks last 5 of flexion and the last 20 of side bending bilaterally Grip strength and sensation normal in bilateral hands Strength good C4 to T1 distribution No sensory change to C4 to T1 Negative Hoffman sign bilaterally Reflexes normal  Patient is tender to palpation of multiple different areas of the thoracic and lower back as well.  Osteopathic findings Cervical C2 flexed rotated and side bent left  Severe tightness of the myofascial T3 extended rotated and side bent right inhaled third rib T5 extended rotated and side bent left L3 flexed rotated and side bent right Sacrum right on right  Procedure note 97110; 15 minutes spent for Therapeutic exercises as stated in above notes.  This included exercises focusing on stretching, strengthening, with significant focus on eccentric aspects.  Basic scapular stabilization to include adduction and depression of scapula Scaption, focusing on proper movement and good control Rows with theraband   Proper technique shown and discussed handout in great detail with ATC.  All questions were discussed and answered.      Impression and Recommendations:     This case required medical decision making of moderate complexity.      Note: This dictation was prepared with Dragon dictation along with smaller phrase technology. Any transcriptional errors that result from this process are unintentional.

## 2016-12-29 NOTE — Patient Instructions (Addendum)
Good to see you  Ice 20 minutes 2 times daily. Usually after activity and before bed. Exercises 3 times a week.  Effexor 37.5 mg daily  Once weekly vitamin D for next 12 weeks We will get labs and see what is going on downstairs Xray of neck downstairs as well.  Tramadol with tylenol up to 3 times a day for breakthrough pain.  With iron take 500mg  of vitamin C Continue your other vitamins Make sure you are getting 75-100 grams of protein.  See me again in 3 weeks.

## 2016-12-29 NOTE — Assessment & Plan Note (Signed)
We will evaluate further. Laboratory workup pending.

## 2016-12-29 NOTE — Assessment & Plan Note (Signed)
Decision today to treat with OMT was based on Physical Exam  After verbal consent patient was treated with ME, FPR techniques in cervical, thoracic, lumbar and sacral areas  Patient tolerated the procedure well with improvement in symptoms  Patient given exercises, stretches and lifestyle modifications  See medications in patient instructions if given  Patient will follow up in 2-4 weeks 

## 2016-12-31 DIAGNOSIS — J301 Allergic rhinitis due to pollen: Secondary | ICD-10-CM | POA: Diagnosis not present

## 2016-12-31 DIAGNOSIS — J3081 Allergic rhinitis due to animal (cat) (dog) hair and dander: Secondary | ICD-10-CM | POA: Diagnosis not present

## 2016-12-31 DIAGNOSIS — J3089 Other allergic rhinitis: Secondary | ICD-10-CM | POA: Diagnosis not present

## 2017-01-14 DIAGNOSIS — J3089 Other allergic rhinitis: Secondary | ICD-10-CM | POA: Diagnosis not present

## 2017-01-14 DIAGNOSIS — J301 Allergic rhinitis due to pollen: Secondary | ICD-10-CM | POA: Diagnosis not present

## 2017-01-14 DIAGNOSIS — J3081 Allergic rhinitis due to animal (cat) (dog) hair and dander: Secondary | ICD-10-CM | POA: Diagnosis not present

## 2017-01-19 NOTE — Progress Notes (Signed)
[name] [place], [place] [address] Phone: [phone] Subjective:    I'm seeing this patient by the request  of:    CC: Neck pain and body pain f/u  [id number]  Ruth [name] is a 36 y.o. female coming in with complaint of neck pain and body pain. Patient does have more of a chronic pain type syndrome. Patient though did respond very well to conservative therapy she states stating that she is 40% better. Was unable to tolerate certain medications and is only been taking oral anti-inflammatories as needed. Patient has been doing the exercises regularly and states that this hasn't made significantly more improvement.     Past Medical History:  Diagnosis Date  . Allergy   . Frequency-urgency sydrome   . GERD (gastroesophageal reflux disease)    no meds  . PONV (postoperative nausea and vomiting)    Past Surgical History:  Procedure Laterality Date  . LAPAROSCOPY  04/19/2012   Procedure: LAPAROSCOPY DIAGNOSTIC;  Surgeon: Selinda Orion, MD;  Location: [facility];  Service: Gynecology;  Laterality: N/A;  left ovarian cystectomy with excision of BENIGN l cystic 7.6cm left teratoma WITH ENDO CATCH REMOVAL and cauterization of endometriosis  . nasal septoplasty  '11  . WISDOM TOOTH EXTRACTION     Social History   Social History  . Marital status: Single    Spouse name: N/A  . Number of children: N/A  . Years of education: N/A   Social History Main Topics  . Smoking status: Former Smoker    Packs/day: 1.00    Quit date: 04/12/2004  . Smokeless tobacco: Never Used  . Alcohol use Yes  . Drug use: Unknown  . Sexual activity: No   Other Topics Concern  . None   Social History Narrative  . None   Allergies  Allergen Reactions  . Lactose Intolerance (Gi) Hives    ANY MILK  BASED PROTEIN  . Whey Protein [Protein] Hives   No family history on file.  Past medical history, social, surgical and family history  all reviewed in electronic medical record.  No pertanent information unless stated regarding to the chief complaint.   Review of Systems: No headache, visual changes, nausea, vomiting, diarrhea, constipation, dizziness, abdominal pain, skin rash, fevers, chills, night sweats, weight loss, swollen lymph nodes, body aches, joint swelling, muscle aches, chest pain, shortness of breath, mood changes.    Objective  Blood pressure 124/84, pulse 97, weight 139 lb (63 kg), SpO2 98 %.   Systems examined below as of 01/20/17 General: NAD A&O x3 mood, affect normal  HEENT: Pupils equal, extraocular movements intact no nystagmus Respiratory: not short of breath at rest or with speaking Cardiovascular: No lower extremity edema, non tender Skin: Warm dry intact with no signs of infection or rash on extremities or on axial skeleton. Abdomen: Soft nontender, no masses Neuro: Cranial nerves  intact, neurovascularly intact in all extremities with 2+ DTRs and 2+ pulses. Lymph: No lymphadenopathy appreciated today  Gait normal with good balance and coordination.  MSK: tender with full range of motion and good stability and symmetric strength and tone of shoulders, elbows, wrist,  knee hips and ankles bilaterally.  Patient still has tenderness to light palpation but improved Neck: Single still mild loss of lordosis No palpable stepoffs. Negative Spurling's maneuver. Increasing range of motion but still significant tightness especially with rotation and side bending bilaterally Grip strength and sensation normal in bilateral hands  Strength good C4 to T1 distribution No sensory change to C4 to T1 Negative Hoffman sign bilaterally Reflexes normal  Significant decrease in tenderness noted previously but still multiple areas of tenderness noted of the paraspinal musculature of the thoracic and lumbar spine.  Osteopathic findings Cervical C2 flexed rotated and side bent right T3 extended rotated and side  bent right inhaled third rib T11 extended rotated and side bent left L2 flexed rotated and side bent right Sacrum right on right     Impression and Recommendations:     This case required medical decision making of moderate complexity.      Note: This dictation was prepared with Dragon dictation along with smaller phrase technology. Any transcriptional errors that result from this process are unintentional.

## 2017-01-20 ENCOUNTER — Ambulatory Visit (INDEPENDENT_AMBULATORY_CARE_PROVIDER_SITE_OTHER): Payer: BLUE CROSS/BLUE SHIELD | Admitting: Family Medicine

## 2017-01-20 ENCOUNTER — Encounter: Payer: Self-pay | Admitting: Family Medicine

## 2017-01-20 VITALS — BP 124/84 | HR 97 | Wt 139.0 lb

## 2017-01-20 DIAGNOSIS — J3081 Allergic rhinitis due to animal (cat) (dog) hair and dander: Secondary | ICD-10-CM | POA: Diagnosis not present

## 2017-01-20 DIAGNOSIS — M999 Biomechanical lesion, unspecified: Secondary | ICD-10-CM | POA: Diagnosis not present

## 2017-01-20 DIAGNOSIS — G894 Chronic pain syndrome: Secondary | ICD-10-CM

## 2017-01-20 DIAGNOSIS — J301 Allergic rhinitis due to pollen: Secondary | ICD-10-CM | POA: Diagnosis not present

## 2017-01-20 DIAGNOSIS — J3089 Other allergic rhinitis: Secondary | ICD-10-CM | POA: Diagnosis not present

## 2017-01-20 NOTE — Progress Notes (Signed)
Pre-visit discussion using our clinic review tool. No additional management support is needed unless otherwise documented below in the visit note.  

## 2017-01-20 NOTE — Assessment & Plan Note (Signed)
Patient is doing significantly better at this time. Discussed with patient at great length. Patient has not taken the oral anti-inflammatories and was unable to tolerate the Effexor. Patient will continue with once weekly vitamin D. We discussed which activities doing which ones to avoid. Patient will follow-up with me again in 4-6 weeks for further evaluation and treatment.

## 2017-01-20 NOTE — Assessment & Plan Note (Signed)
Decision today to treat with OMT was based on Physical Exam  After verbal consent patient was treated with HVLA, ME, FPR techniques in cervical, thoracic, rib lumbar and sacral areas  Patient tolerated the procedure well with improvement in symptoms  Patient given exercises, stretches and lifestyle modifications  See medications in patient instructions if given  Patient will follow up in 4-6 weeks 

## 2017-01-20 NOTE — Patient Instructions (Signed)
Good to see you  You are doing amazing.  Making progress already  Keep doing what you are doing  See me again in 4-6 weeks!

## 2017-01-28 DIAGNOSIS — J3089 Other allergic rhinitis: Secondary | ICD-10-CM | POA: Diagnosis not present

## 2017-01-28 DIAGNOSIS — J301 Allergic rhinitis due to pollen: Secondary | ICD-10-CM | POA: Diagnosis not present

## 2017-01-28 DIAGNOSIS — J3081 Allergic rhinitis due to animal (cat) (dog) hair and dander: Secondary | ICD-10-CM | POA: Diagnosis not present

## 2017-02-04 DIAGNOSIS — J3081 Allergic rhinitis due to animal (cat) (dog) hair and dander: Secondary | ICD-10-CM | POA: Diagnosis not present

## 2017-02-04 DIAGNOSIS — J3089 Other allergic rhinitis: Secondary | ICD-10-CM | POA: Diagnosis not present

## 2017-02-04 DIAGNOSIS — J301 Allergic rhinitis due to pollen: Secondary | ICD-10-CM | POA: Diagnosis not present

## 2017-02-11 DIAGNOSIS — J3081 Allergic rhinitis due to animal (cat) (dog) hair and dander: Secondary | ICD-10-CM | POA: Diagnosis not present

## 2017-02-11 DIAGNOSIS — J301 Allergic rhinitis due to pollen: Secondary | ICD-10-CM | POA: Diagnosis not present

## 2017-02-11 DIAGNOSIS — J3089 Other allergic rhinitis: Secondary | ICD-10-CM | POA: Diagnosis not present

## 2017-02-17 DIAGNOSIS — J3089 Other allergic rhinitis: Secondary | ICD-10-CM | POA: Diagnosis not present

## 2017-02-17 DIAGNOSIS — J3081 Allergic rhinitis due to animal (cat) (dog) hair and dander: Secondary | ICD-10-CM | POA: Diagnosis not present

## 2017-02-17 DIAGNOSIS — J301 Allergic rhinitis due to pollen: Secondary | ICD-10-CM | POA: Diagnosis not present

## 2017-02-18 ENCOUNTER — Ambulatory Visit: Payer: BLUE CROSS/BLUE SHIELD | Admitting: Family Medicine

## 2017-03-07 ENCOUNTER — Ambulatory Visit: Payer: Self-pay | Admitting: Family Medicine

## 2017-03-09 DIAGNOSIS — J301 Allergic rhinitis due to pollen: Secondary | ICD-10-CM | POA: Diagnosis not present

## 2017-03-09 DIAGNOSIS — J3089 Other allergic rhinitis: Secondary | ICD-10-CM | POA: Diagnosis not present

## 2017-03-09 DIAGNOSIS — J3081 Allergic rhinitis due to animal (cat) (dog) hair and dander: Secondary | ICD-10-CM | POA: Diagnosis not present

## 2017-03-10 ENCOUNTER — Ambulatory Visit: Payer: BLUE CROSS/BLUE SHIELD | Admitting: Family Medicine

## 2017-04-14 DIAGNOSIS — J358 Other chronic diseases of tonsils and adenoids: Secondary | ICD-10-CM | POA: Diagnosis not present

## 2017-04-28 ENCOUNTER — Ambulatory Visit: Payer: Self-pay | Admitting: Family Medicine

## 2017-05-04 ENCOUNTER — Encounter (HOSPITAL_COMMUNITY): Payer: Self-pay | Admitting: Emergency Medicine

## 2017-05-04 ENCOUNTER — Ambulatory Visit (HOSPITAL_COMMUNITY)
Admission: EM | Admit: 2017-05-04 | Discharge: 2017-05-04 | Disposition: A | Discharge status: Nursing Home (Includes ICF) | Payer: BLUE CROSS/BLUE SHIELD | Source: Home / Self Care | Attending: Internal Medicine | Admitting: Internal Medicine

## 2017-05-04 DIAGNOSIS — R1012 Left upper quadrant pain: Secondary | ICD-10-CM | POA: Diagnosis not present

## 2017-05-04 DIAGNOSIS — Z87891 Personal history of nicotine dependence: Secondary | ICD-10-CM | POA: Diagnosis not present

## 2017-05-04 DIAGNOSIS — R531 Weakness: Secondary | ICD-10-CM | POA: Diagnosis not present

## 2017-05-04 DIAGNOSIS — R109 Unspecified abdominal pain: Secondary | ICD-10-CM | POA: Diagnosis not present

## 2017-05-04 DIAGNOSIS — R1011 Right upper quadrant pain: Secondary | ICD-10-CM

## 2017-05-04 LAB — COMPREHENSIVE METABOLIC PANEL
ALT: 18 U/L (ref 14–54)
ANION GAP: 9 (ref 5–15)
AST: 19 U/L (ref 15–41)
Albumin: 4.4 g/dL (ref 3.5–5.0)
Alkaline Phosphatase: 63 U/L (ref 38–126)
BUN: 5 mg/dL — ABNORMAL LOW (ref 6–20)
CHLORIDE: 106 mmol/L (ref 101–111)
CO2: 22 mmol/L (ref 22–32)
CREATININE: 0.68 mg/dL (ref 0.44–1.00)
Calcium: 9.6 mg/dL (ref 8.9–10.3)
Glucose, Bld: 93 mg/dL (ref 65–99)
POTASSIUM: 3.7 mmol/L (ref 3.5–5.1)
SODIUM: 137 mmol/L (ref 135–145)
Total Bilirubin: 1 mg/dL (ref 0.3–1.2)
Total Protein: 8 g/dL (ref 6.5–8.1)

## 2017-05-04 LAB — URINALYSIS, ROUTINE W REFLEX MICROSCOPIC
Bilirubin Urine: NEGATIVE
GLUCOSE, UA: NEGATIVE mg/dL
Hgb urine dipstick: NEGATIVE
Ketones, ur: NEGATIVE mg/dL
LEUKOCYTES UA: NEGATIVE
Nitrite: NEGATIVE
PROTEIN: NEGATIVE mg/dL
Specific Gravity, Urine: 1 — ABNORMAL LOW (ref 1.005–1.030)
pH: 8 (ref 5.0–8.0)

## 2017-05-04 LAB — CBC
HCT: 42.5 % (ref 36.0–46.0)
HEMOGLOBIN: 13.9 g/dL (ref 12.0–15.0)
MCH: 28.7 pg (ref 26.0–34.0)
MCHC: 32.7 g/dL (ref 30.0–36.0)
MCV: 87.8 fL (ref 78.0–100.0)
PLATELETS: 282 10*3/uL (ref 150–400)
RBC: 4.84 MIL/uL (ref 3.87–5.11)
RDW: 13.1 % (ref 11.5–15.5)
WBC: 10.6 10*3/uL — AB (ref 4.0–10.5)

## 2017-05-04 LAB — HCG, QUANTITATIVE, PREGNANCY: HCG, BETA CHAIN, QUANT, S: 1 m[IU]/mL (ref <5)

## 2017-05-04 LAB — LIPASE, BLOOD: LIPASE: 30 U/L (ref 11–51)

## 2017-05-04 NOTE — ED Triage Notes (Signed)
Pt sent by urgent care, reports lower right abd pain, extreme fatigue and inability to stand due to weakness.  Pt denies n/v/sob and loc.

## 2017-05-04 NOTE — Discharge Instructions (Signed)
Based on symptoms, and physical exam findings, I think you'll need further evaluation than what we have available here in the urgent care, and I'm recommending we send you to the ER.

## 2017-05-04 NOTE — ED Triage Notes (Signed)
The patient presented to the [facility] with a complaint of fatigue and muscle tremors since Saturday. The patient stated that she drank a larger than normal amount of alcohol the night before.

## 2017-05-05 ENCOUNTER — Emergency Department (HOSPITAL_COMMUNITY)
Admission: EM | Admit: 2017-05-05 | Discharge: 2017-05-05 | Disposition: A | Discharge status: Home / Self Care | Payer: BLUE CROSS/BLUE SHIELD | Attending: Emergency Medicine | Admitting: Emergency Medicine

## 2017-05-05 DIAGNOSIS — R531 Weakness: Secondary | ICD-10-CM

## 2017-05-05 NOTE — Discharge Instructions (Signed)
Please follow with your primary care doctor in the next 2 days for a check-up. They must obtain records for further management.  ° °Do not hesitate to return to the Emergency Department for any new, worsening or concerning symptoms.  ° °

## 2017-05-05 NOTE — ED Provider Notes (Signed)
[facility] DEPT Provider Note   CSN: [id number] Arrival date & time: 05/04/17  2023  By signing my name below, I, Reola Mosher, attest that this documentation has been prepared under the direction and in the presence of [employer], PA-C.  Electronically Signed: Reola Mosher, ED Scribe. 05/05/17. 1:00 AM.  History   Chief Complaint Chief Complaint  Patient presents with  . Abdominal Pain  . Weakness   The history is provided by the patient. No language interpreter was used.   HPI Comments: Ruth [name] is a 36 y.o. female who presents to the Emergency Department complaining of persistent weakness beginning four days ago with accompanying worsening right-sided abdominal pain onset yesterday afternoon. Pt reports that four days ago she was driving [place] the mountains when she had an acute onset of generalized weakness/fatigue. Pt reports that the night prior to this she had consumed a large amount of alcohol; however, the morning prior to the onset of her abdominal pain she was otherwise asymptomatic. She attempted to remedy her symptoms by increasing her fluid intake and taking an epsom salt bath and the next day she woke up with some improvement in her weakness. Her weakness has still been ongoing; however, yesterday afternoon she attempted to go to the grocery store when her weakness acutely worsened and she had a new onset of right-sided abdominal pain. Today, pt went to the [facility] where she was evaluated and referred into the ED for further workup. She notes associated shortness of breath and chills since the onset of her symptoms. She describes her pain as dull. Her pain is worse with palpation over the area. No noted treatments for her symptoms were tried prior to coming into the ED. Pt has a PSHx of prior teratoma removal from the right ovary; but otherwise no other PSHx to the abdomen. Pt denies any known insect, arachnid, or tick bites. She denies  diarrhea, constipation, nausea, vomiting, hematochezia, melena, urgency, frequency, hematuria, dysuria, difficulty urinating, chest pain, fever, or any other associated symptoms.   PCP: Wendie Agreste, MD  Past Medical History:  Diagnosis Date  . Allergy   . Frequency-urgency sydrome   . GERD (gastroesophageal reflux disease)    no meds  . PONV (postoperative nausea and vomiting)    Patient Active Problem List   Diagnosis Date Noted  . Chronic neck pain 12/29/2016  . Chronic pain syndrome 12/29/2016  . Nonallopathic lesion of cervical region 12/29/2016  . Nonallopathic lesion of thoracic region 12/29/2016  . Nonallopathic lesion of lumbosacral region 12/29/2016  . 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 HYPOTENSION 07/23/2007  . ACNE, MILD 07/23/2007   Past Surgical History:  Procedure Laterality Date  . LAPAROSCOPY  04/19/2012   Procedure: LAPAROSCOPY DIAGNOSTIC;  Surgeon: Selinda Orion, MD;  Location: [facility];  Service: Gynecology;  Laterality: N/A;  left ovarian cystectomy with excision of BENIGN l cystic 7.6cm left teratoma WITH ENDO CATCH REMOVAL and cauterization of endometriosis  . nasal septoplasty  '11  . WISDOM TOOTH EXTRACTION      OB History    No data available       Home Medications    Prior to Admission medications   Medication Sig Start Date End Date Taking? Authorizing Provider  ALPRAZolam [name]) 1 MG tablet Take 0.5 mg by mouth at bedtime.    [provider]  Multiple Vitamin (MULTIVITAMIN WITH MINERALS) TABS tablet Take 1 tablet by mouth daily.  [provider]  naproxen sodium (ANAPROX) 220 MG tablet Take 220 mg by mouth.    [provider]  Vitamin D, Ergocalciferol, (DRISDOL) 50000 units CAPS capsule Take 1 capsule (50,000 Units total) by mouth every 7 (seven) days. 12/29/16   Lyndal Pulley, DO    Family History No family history on file.  Social History Social History  Substance Use Topics    . Smoking status: Former Smoker    Packs/day: 1.00    Quit date: 04/12/2004  . Smokeless tobacco: Never Used  . Alcohol use Yes   Allergies   Lactose intolerance (gi) and Whey protein [protein]  Review of Systems Review of Systems  A complete review of systems was obtained and all systems are negative except as noted in the HPI and PMH.   Physical Exam Updated Vital Signs BP 96/68 (BP Location: Left Arm)   Pulse 85   Temp 98.6 F (37 C) (Oral)   Resp 16   Ht 5' 4.5" (1.638 m)   Wt 140 lb (63.5 kg)   LMP 04/18/2017 (Approximate)   SpO2 99%   BMI 23.66 kg/m   Physical Exam  Constitutional: She appears well-developed and well-nourished. No distress.  HENT:  Head: Normocephalic and atraumatic.  Eyes: Conjunctivae are normal.  Neck: Normal range of motion.  Cardiovascular: Normal rate.  Exam reveals no gallop and no friction rub.   No murmur heard. Pulmonary/Chest: Effort normal. No respiratory distress. She has no wheezes. She has no rales. She exhibits no tenderness.  Abdominal: She exhibits no distension and no mass. There is no tenderness. There is no rebound and no guarding. No hernia.  No tenderness to deep palpation of any quadrant. Rovsing, psoas and obturator are negative. Murphy sign negative.  Musculoskeletal: Normal range of motion. She exhibits no edema.  Neurological: She is alert.  Skin: Capillary refill takes less than 2 seconds. No pallor.  Psychiatric: She has a normal mood and affect. Her behavior is normal.  Nursing note and vitals reviewed.  ED Treatments / Results  DIAGNOSTIC STUDIES: Oxygen Saturation is 99% on RA, normal by my interpretation.   COORDINATION OF CARE: 1:00 AM-Discussed next steps with pt. Pt verbalized understanding and is agreeable with the plan.   Labs (all labs ordered are listed, but only abnormal results are displayed) Labs Reviewed  COMPREHENSIVE METABOLIC PANEL - Abnormal; Notable for the following:       Result Value    BUN 5 (*)    All other components within normal limits  CBC - Abnormal; Notable for the following:    WBC 10.6 (*)    All other components within normal limits  URINALYSIS, ROUTINE W REFLEX MICROSCOPIC - Abnormal; Notable for the following:    Color, Urine STRAW (*)    Specific Gravity, Urine 1.000 (*)    All other components within normal limits  LIPASE, BLOOD  HCG, QUANTITATIVE, PREGNANCY    EKG  EKG Interpretation None       Radiology No results found.  Procedures Procedures (including critical care time)  Medications Ordered in ED Medications - No data to display  Initial Impression / Assessment and Plan / ED Course  I have reviewed the triage vital signs and the nursing notes.  Pertinent labs & imaging results that were available during my care of the patient were reviewed by me and considered in my medical decision making (see chart for details).     Vitals:   05/04/17 2033 05/04/17 2034 05/04/17  2307  BP: 121/80  96/68  Pulse: 85  85  Resp: 20  16  Temp: 99 F (37.2 C)  98.6 F (37 C)  TempSrc: Oral  Oral  SpO2: 100%  99%  Weight:  63.5 kg (140 lb)   Height:  5' 4.5" (1.638 m)     Medications - No data to display  Ruth [name] is 36 y.o. female presenting with Some abdominal discomfort however on my exam she is nontender. She reports generalized fatigue. Patient has no focal chest pain, vital signs are reassuring. She was sent to the ED for evaluation of abdominal pain however on my exam she is nontender and given her reassuring blood work I have offered this patient imaging but advised her I do not think it is needed emergently. Patient agrees and shared decision-making with care plan for discharge to home with close follow-up with primary care.  Evaluation does not show pathology that would require ongoing emergent intervention or inpatient treatment. Pt is hemodynamically stable and mentating appropriately. Discussed findings and plan with  patient/guardian, who agrees with care plan. All questions answered. Return precautions discussed and outpatient follow up given.      Final Clinical Impressions(s) / ED Diagnoses   Final diagnoses:  None    New Prescriptions New Prescriptions   No medications on file   I personally performed the services described in this documentation, which was scribed in my presence. The recorded information has been reviewed and is accurate.     Waynetta Pean 05/05/17 [id number]    Ward, Delice Bison, DO 05/05/17 2310

## 2017-05-05 NOTE — ED Notes (Signed)
Pt departed in NAD.  

## 2017-05-05 NOTE — ED Provider Notes (Signed)
CSN: [id number]     Arrival date & time 05/04/17  1858 History   None    Chief Complaint  Patient presents with  . Fatigue   (Consider location/radiation/quality/duration/timing/severity/associated sxs/prior Treatment) 36 year old female presents to clinic for evaluation of weakness, muscle cramps, weakness, and abdominal pain. Patient admits to large amount of alcohol ingestion 2 days ago, states that she is felt these symptoms since, and has pain in her belly. States she's also had increased cramping, and weakness within her muscles as well. States she has been drinking fluids, has had no change in urinary output. Denies possibility of pregnancy, is unsure of fever, but has had some chills.   The history is provided by the patient.    Past Medical History:  Diagnosis Date  . Allergy   . Frequency-urgency sydrome   . GERD (gastroesophageal reflux disease)    no meds  . PONV (postoperative nausea and vomiting)    Past Surgical History:  Procedure Laterality Date  . LAPAROSCOPY  04/19/2012   Procedure: LAPAROSCOPY DIAGNOSTIC;  Surgeon: Selinda Orion, MD;  Location: [facility];  Service: Gynecology;  Laterality: N/A;  left ovarian cystectomy with excision of BENIGN l cystic 7.6cm left teratoma WITH ENDO CATCH REMOVAL and cauterization of endometriosis  . nasal septoplasty  '11  . WISDOM TOOTH EXTRACTION     History reviewed. No pertinent family history. Social History  Substance Use Topics  . Smoking status: Former Smoker    Packs/day: 1.00    Quit date: 04/12/2004  . Smokeless tobacco: Never Used  . Alcohol use Yes   OB History    No data available     Review of Systems  Constitutional: Positive for chills and fatigue. Negative for fever.  HENT: Negative.   Respiratory: Negative.   Cardiovascular: Negative.   Gastrointestinal: Positive for abdominal pain and nausea. Negative for diarrhea and vomiting.  Genitourinary: Negative.   Musculoskeletal: Positive  for myalgias.  Skin: Negative.   Neurological: Positive for weakness and light-headedness. Negative for headaches.    Allergies  Lactose intolerance (gi) and Whey protein [protein]  Home Medications   Prior to Admission medications   Medication Sig Start Date End Date Taking? Authorizing Provider  naproxen sodium (ANAPROX) 220 MG tablet Take 220 mg by mouth at bedtime.    Yes [provider]  ALPRAZolam (XANAX) 0.25 MG tablet Take 0.25 mg by mouth at bedtime.    [provider]  Vitamin D, Ergocalciferol, (DRISDOL) 50000 units CAPS capsule Take 1 capsule (50,000 Units total) by mouth every 7 (seven) days. Patient not taking: Reported on 05/05/2017 12/29/16   Lyndal Pulley, DO   Meds Ordered and Administered this Visit  Medications - No data to display  BP 118/82 (BP Location: Left Arm)   Pulse 96   Temp 99 F (37.2 C) (Oral)   Resp 16   LMP 04/18/2017 (Approximate)   SpO2 100%  No data found.   Physical Exam  Constitutional: She is oriented to person, place, and time. She appears well-developed and well-nourished. No distress.  HENT:  Head: Normocephalic.  Right Ear: External ear normal.  Left Ear: External ear normal.  Eyes: Conjunctivae are normal.  Neck: Normal range of motion.  Cardiovascular: Normal rate and regular rhythm.   Pulmonary/Chest: Effort normal and breath sounds normal.  Abdominal: Soft. Normal appearance and bowel sounds are normal. There is tenderness in the right upper quadrant. There is positive Murphy's sign. There is no rebound and no  guarding.  Neurological: She is alert and oriented to person, place, and time.  Skin: Skin is warm. Capillary refill takes less than 2 seconds. She is diaphoretic.  Nursing note and vitals reviewed.   Urgent Care Course     Procedures (including critical care time)  Labs Review Labs Reviewed - No data to display  Imaging Review No results found.    MDM   1. Weakness   2. Right upper  quadrant abdominal pain    Positive Murphy's sign. Patient does not have fever, however temperature is slightly elevated at 99, and she's diaphoretic. Notably week requiring assistance from her wheelchair to the exam table. Believe she needs further evaluation and workup than what can be done here in the urgent care, recommend going to the ER for further evaluation.    Barnet Glasgow, NP 05/05/17 [phone]

## 2017-05-13 ENCOUNTER — Ambulatory Visit (HOSPITAL_COMMUNITY)
Admission: EM | Admit: 2017-05-13 | Discharge: 2017-05-13 | Disposition: A | Discharge status: Home / Self Care | Payer: BLUE CROSS/BLUE SHIELD | Attending: Family Medicine | Admitting: Family Medicine

## 2017-05-13 ENCOUNTER — Encounter (HOSPITAL_COMMUNITY): Payer: Self-pay | Admitting: Emergency Medicine

## 2017-05-13 DIAGNOSIS — L509 Urticaria, unspecified: Secondary | ICD-10-CM

## 2017-05-13 NOTE — ED Triage Notes (Signed)
The patient presented to the [facility] with a complaint of redness and swelling to her arms in the area where she had previously received allergy shots.

## 2017-05-16 NOTE — ED Provider Notes (Signed)
  Wilmer   [id number] 05/13/17 Arrival Time: 1925  ASSESSMENT & PLAN:  1. Urticaria    Already improving. Elects Benadryl as needed instead of steroids. Observe.  Reviewed expectations re: course of current medical issues. Questions answered. Outlined signs and symptoms indicating need for more acute intervention. Patient verbalized understanding. After Visit Summary given.   SUBJECTIVE:  Ruth [name] is a 36 y.o. female who presents with complaint of itching and rash around area where she has had allergy shots in the past. Upper posterior R arm. Noticed approx 1 hour ago but already improving. Was standing outside when she noticed. No resp difficulties. No n/v. No specific aggravating or alleviating factors reported.. No self treatment.  ROS: As per HPI.   OBJECTIVE:  Vitals:   05/13/17 2019  BP: 119/84  Pulse: 84  Resp: 18  Temp: 98.9 F (37.2 C)  TempSrc: Oral  SpO2: 100%     General appearance: alert; no distress HEENT: normocephalic; atraumatic; conjunctivae normal; TMs normal; nasal mucosa normal; oral mucosa normal Neck: supple Lungs: normal repirations Skin: warm and dry; a few small wheals on posterior upper R arm, + dermatographism   Allergies  Allergen Reactions  . Lactose Intolerance (Gi) Hives    ANY MILK  BASED PROTEIN  . Whey Protein [Protein] Hives    PMHx, SurgHx, SocialHx, Medications, and Allergies were reviewed in the Visit Navigator and updated as appropriate.      Vanessa Kick, MD 05/16/17 [phone]

## 2017-05-21 ENCOUNTER — Encounter: Payer: Self-pay | Admitting: Family Medicine

## 2017-05-21 ENCOUNTER — Ambulatory Visit (INDEPENDENT_AMBULATORY_CARE_PROVIDER_SITE_OTHER): Payer: BLUE CROSS/BLUE SHIELD | Admitting: Family Medicine

## 2017-05-21 VITALS — BP 110/73 | HR 90 | Temp 98.5°F | Resp 16 | Ht 64.5 in | Wt 142.4 lb

## 2017-05-21 DIAGNOSIS — F411 Generalized anxiety disorder: Secondary | ICD-10-CM | POA: Diagnosis not present

## 2017-05-21 DIAGNOSIS — F5104 Psychophysiologic insomnia: Secondary | ICD-10-CM | POA: Diagnosis not present

## 2017-05-21 DIAGNOSIS — R531 Weakness: Secondary | ICD-10-CM | POA: Diagnosis not present

## 2017-05-21 DIAGNOSIS — K121 Other forms of stomatitis: Secondary | ICD-10-CM | POA: Diagnosis not present

## 2017-05-21 DIAGNOSIS — R61 Generalized hyperhidrosis: Secondary | ICD-10-CM | POA: Diagnosis not present

## 2017-05-21 DIAGNOSIS — M2550 Pain in unspecified joint: Secondary | ICD-10-CM | POA: Diagnosis not present

## 2017-05-21 DIAGNOSIS — R29818 Other symptoms and signs involving the nervous system: Secondary | ICD-10-CM

## 2017-05-21 DIAGNOSIS — R410 Disorientation, unspecified: Secondary | ICD-10-CM

## 2017-05-21 DIAGNOSIS — M6281 Muscle weakness (generalized): Secondary | ICD-10-CM

## 2017-05-21 DIAGNOSIS — L679 Hair color and hair shaft abnormality, unspecified: Secondary | ICD-10-CM

## 2017-05-21 MED ORDER — HYDROXYZINE HCL 25 MG PO TABS: 12.5000 mg | ORAL_TABLET | Freq: Every evening | ORAL | 0 refills | Status: DC | PRN

## 2017-05-21 NOTE — Patient Instructions (Addendum)
  I will order CT of your head, other blood work including thyroid, inflammation tests, then please return within the next 10 days to discuss those results and next step. I would consider meeting with neurology or rheumatology depending on the results of those tests.  For sleep, try the hydroxyzine in place of alprazolam, 1/2-1 at bedtime as needed. If that does not work, you can return to taking the alprazolam. We can discuss this further next visit. If you notice increasing anxiety, especially during the day, I would recommend trying a low-dose SSRI.   Return to the clinic or go to the nearest emergency room if any of your symptoms worsen or new symptoms occur.   IF you received an x-ray today, you will receive an invoice from [facility]. Please contact [facility] at [phone] with questions or concerns regarding your invoice.   IF you received labwork today, you will receive an invoice from [place]. Please contact [facility] at [phone] with questions or concerns regarding your invoice.   Our billing staff will not be able to assist you with questions regarding bills from these companies.  You will be contacted with the lab results as soon as they are available. The fastest way to get your results is to activate your My Chart account. Instructions are located on the last page of this paperwork. If you have not heard from [place] regarding the results in 2 weeks, please contact this office.

## 2017-05-21 NOTE — Progress Notes (Signed)
By signing my name below, I, Ruth [name], attest that this documentation has been prepared under the direction and in the presence of [name], MD.  Electronically Signed: [name], Medical Scribe. 05/21/17. 4:33 PM.  Subjective:    Patient ID: Ruth [name], female    DOB: [birth year], 36 y.o.   MRN: [id number]  HPI Chief Complaint  Patient presents with  . Medication Refill    xanax- would like to get med here instead of with psychiatrist  . auto immune    wants to talk about starting the process    HPI Comments: SHAMIA [name] is a 36 y.o. female who presents to Primary Care at [facility] for multiple concerns. She was last seen here in 2015 and last seen by myself in 2013. Care everywhere was reviewed.   Anxiety/Sleep: Xanax refill for anxiety. She's had anxiety since childhood and tries to control it with breathing, cognitive thinking, or other natural ways. Was put on paxil as a kid and she didn't like it. Pt doesn't like the psychologist she seeing and it's cheaper to get it filled here. She has a 15-20 day window of xanax remaining. She takes 0.5 mg to sleep (15 in a month) and aleve for arthralgias QHS. She's tried half xanax without success. Pt typically doesn't wake up at night. Notes having bad sleep paralysis, wakes up with clenches fist and has night mares. She exercises, makes sure her bed room is the right environment, get her electrolytes in, and makes sure other things are controlled to sleep. Pt had turbinate reduction and it's helped her sleep at night. Pt was tested for sleep apnea and her breathing was mild. Reports sleeping troubles in her whole family.  Autoimmune: Tonsils were swollen on one side than the other. [name] ENT June 26th for tonsillar asymmetry, reassuring exam. On review of previous records. ED July 17th, slight abdominal discomfort but reassuring exam. WBC 10.6, borderline, neg HCG, UA was over all nl, borderline SG 1.00, lipase nl, CMP  overall nl, no neuro imaging performed.  2 weeks ago pt was "hit with illness (unsure if it was a virus)" that triggered: transient confusion that's described as "glitchy", fatigue, and episodic weakness to the point she couldn't get up. She's only felt weakness like that after vomiting for 4 days when she had the flu and states she was feeling like she was hit with the flu without N/V/D. She couldn't get out of bed for 2-3 days before getting checked at the ER. Was told in the ED that her her labs and exam were reassuring, but she's felt "off" ever since her visit. She also note having ongoing: arthralgias (located in her upper neck, left hand, shoulder, wrist, and elbows), adenopathy in her upper body (located in neck, axilla), malaise, left side weakness described as feeling "different", intermittent night sweats for the last couple of years, hair falling out, frequent hives, dry eyes, and mouth ulcers 3-4x a month. She had her hair shedding checked at the dermatologist and after getting her thyroid checked was told she was "a little hyper". Pt doesn't think her anxiety has increased and her life has been fine. Denies unexpected weight loss, HAs, joint swelling, current mouth ulcers, or rash.   Patient Active Problem List   Diagnosis Date Noted  . Chronic neck pain 12/29/2016  . Chronic pain syndrome 12/29/2016  . Nonallopathic lesion of cervical region 12/29/2016  . Nonallopathic lesion of thoracic region 12/29/2016  . Nonallopathic lesion  of lumbosacral region 12/29/2016  . 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 HYPOTENSION 07/23/2007  . ACNE, MILD 07/23/2007   Past Medical History:  Diagnosis Date  . Allergy   . Arthritis   . Frequency-urgency sydrome   . GERD (gastroesophageal reflux disease)    no meds  . PONV (postoperative nausea and vomiting)    Past Surgical History:  Procedure Laterality Date  . LAPAROSCOPY  04/19/2012   Procedure: LAPAROSCOPY DIAGNOSTIC;  Surgeon: Selinda Orion, MD;  Location: [facility];  Service: Gynecology;  Laterality: N/A;  left ovarian cystectomy with excision of BENIGN l cystic 7.6cm left teratoma WITH ENDO CATCH REMOVAL and cauterization of endometriosis  . nasal septoplasty  '11  . WISDOM TOOTH EXTRACTION     Allergies  Allergen Reactions  . Lactose Intolerance (Gi) Hives    ANY MILK  BASED PROTEIN  . Whey Protein [Protein] Hives   Prior to Admission medications   Medication Sig Start Date End Date Taking? Authorizing Provider  ALPRAZolam (XANAX) 0.25 MG tablet Take 0.25 mg by mouth at bedtime.    [provider]   Social History   Social History  . Marital status: Single    Spouse name: N/A  . Number of children: N/A  . Years of education: N/A   Occupational History  . Not on file.   Social History Main Topics  . Smoking status: Former Smoker    Packs/day: 1.00    Quit date: 04/12/2004  . Smokeless tobacco: Never Used  . Alcohol use Yes     Comment: social  . Drug use: No  . Sexual activity: Yes   Other Topics Concern  . Not on file   Social History Narrative  . No narrative on file   Review of Systems  Constitutional: Positive for diaphoresis and fatigue. Negative for fever and unexpected weight change.  HENT: Negative for mouth sores.   Gastrointestinal: Negative for diarrhea, nausea and vomiting.  Musculoskeletal: Positive for arthralgias. Negative for joint swelling.  Skin: Negative for rash.  Neurological: Negative for headaches.  Hematological: Positive for adenopathy.  Psychiatric/Behavioral: Positive for confusion and sleep disturbance. The patient is nervous/anxious.    Objective:  Physical Exam  Constitutional: She appears well-developed and well-nourished. No distress.  HENT:  Head: Normocephalic and atraumatic.  Slightly dry lips No apparent ulceration No mouth ulcers  Eyes: Pupils are equal, round, and reactive to light. Conjunctivae are normal.  Neck: Neck  supple.  Cardiovascular: Normal rate.   Pulmonary/Chest: Effort normal.  Neurological: She is alert.  Equal grip strength No apparent weakness on left side Wavers with Rombergs but self corrects No pronator driift  Skin: Skin is warm and dry.  Psychiatric: She has a normal mood and affect. Her behavior is normal.  Nursing note and vitals reviewed.   Vitals:   05/21/17 1622  BP: 110/73  Pulse: 90  Resp: 16  Temp: 98.5 F (36.9 C)  TempSrc: Oral  SpO2: 97%  Weight: 142 lb 6.4 oz (64.6 kg)  Height: 5' 4.5" (1.638 m)   Body mass index is 24.07 kg/m. Assessment & Plan:   LAYNEE LOCKAMY is a 36 y.o. female Psychophysiological insomnia - Plan: hydrOXYzine (ATARAX/VISTARIL) 25 MG tablet Generalized anxiety disorder - Plan: TSH  - Intermittent benzodiazepine dosing at night only, has worked on exercise and other self management treatments and reportedly stable. Would like to hold off on SSRI at this time. Discussed potential low-dose SSRI if persistent anxiety  or worsening anxiety symptoms.  - Hydroxyzine prescribed as alternative to benzodiazepine. Low-dose and variability and dosing discussed.  Episodic weakness Transient confusion Left-sided muscle weakness Other symptoms and signs involving the nervous system - Plan: CT Head Wo Contrast  - Nonspecific neurologic symptoms after possible viral illness. Does not sound like typical Guillain-Barr and is improving. No appreciated weakness and nonfocal neuro exam in office.  -Initially will check CT head without contrast, tsh sed rate as below and recheck in the next 10 days  -Consider neurology versus rheumatology eval. RTC/ER precautions if acute worsening.  Night sweats - Plan: CBC, TSH Hair changes - Plan: TSH  -Check TSH, CBC with night sweats and sedimentation rate. Follow-up to discuss symptoms further and next 10 days.  Mouth ulcers - Plan: Sedimentation Rate Polyarthralgia - Plan: Rheumatoid factor  -Differential includes  autoimmune versus rheumatologic syndrome, but nonspecific. Check rheumatoid factor, sedimentation rate, follow-up in the next 10 days and can discuss rheumatology eval potentially at that time. No apparent joint swelling or concerns on exam in office.  Meds ordered this encounter  Medications  . hydrOXYzine (ATARAX/VISTARIL) 25 MG tablet    Sig: Take 0.5-1 tablets (12.5-25 mg total) by mouth at bedtime as needed for anxiety.    Dispense:  30 tablet    Refill:  0   Patient Instructions    I will order CT of your head, other blood work including thyroid, inflammation tests, then please return within the next 10 days to discuss those results and next step. I would consider meeting with neurology or rheumatology depending on the results of those tests.  For sleep, try the hydroxyzine in place of alprazolam, 1/2-1 at bedtime as needed. If that does not work, you can return to taking the alprazolam. We can discuss this further next visit. If you notice increasing anxiety, especially during the day, I would recommend trying a low-dose SSRI.   Return to the clinic or go to the nearest emergency room if any of your symptoms worsen or new symptoms occur.   IF you received an x-ray today, you will receive an invoice from [facility]. Please contact [facility] at [phone] with questions or concerns regarding your invoice.   IF you received labwork today, you will receive an invoice from [place]. Please contact [facility] at [phone] with questions or concerns regarding your invoice.   Our billing staff will not be able to assist you with questions regarding bills from these companies.  You will be contacted with the lab results as soon as they are available. The fastest way to get your results is to activate your My Chart account. Instructions are located on the last page of this paperwork. If you have not heard from [place] regarding the results in 2 weeks, please contact this  office.      I personally performed the services described in this documentation, which was scribed in my presence. The recorded information has been reviewed and considered for accuracy and completeness, addended by me as needed, and agree with information above.  Signed,   [name], MD [facility] at [place].  05/23/17 10:05 PM

## 2017-05-22 LAB — CBC
HEMATOCRIT: 39.5 % (ref 34.0–46.6)
HEMOGLOBIN: 13.2 g/dL (ref 11.1–15.9)
MCH: 29 pg (ref 26.6–33.0)
MCHC: 33.4 g/dL (ref 31.5–35.7)
MCV: 87 fL (ref 79–97)
Platelets: 275 10*3/uL (ref 150–379)
RBC: 4.55 x10E6/uL (ref 3.77–5.28)
RDW: 13.3 % (ref 12.3–15.4)
WBC: 7.3 10*3/uL (ref 3.4–10.8)

## 2017-05-22 LAB — RHEUMATOID FACTOR

## 2017-05-22 LAB — SEDIMENTATION RATE: SED RATE: 3 mm/h (ref 0–32)

## 2017-05-22 LAB — TSH: TSH: 0.826 u[IU]/mL (ref 0.450–4.500)

## 2017-05-27 ENCOUNTER — Other Ambulatory Visit: Payer: Self-pay | Admitting: Family Medicine

## 2017-06-01 ENCOUNTER — Ambulatory Visit (INDEPENDENT_AMBULATORY_CARE_PROVIDER_SITE_OTHER): Payer: BLUE CROSS/BLUE SHIELD | Admitting: Family Medicine

## 2017-06-01 ENCOUNTER — Encounter: Payer: Self-pay | Admitting: Family Medicine

## 2017-06-01 VITALS — BP 109/72 | HR 83 | Temp 98.7°F | Resp 16 | Ht 64.5 in | Wt 144.0 lb

## 2017-06-01 DIAGNOSIS — F5104 Psychophysiologic insomnia: Secondary | ICD-10-CM

## 2017-06-01 MED ORDER — ALPRAZOLAM 0.5 MG PO TABS: 0.2500 mg | ORAL_TABLET | Freq: Every evening | ORAL | 0 refills | Status: DC | PRN

## 2017-06-01 NOTE — Progress Notes (Signed)
Subjective:  By signing my name below, I, Ruth [name], attest that this documentation has been prepared under the direction and in the presence of Ruth Ray, MD. Electronically Signed: [name], [place]. 06/01/2017 , 2:25 PM .  Patient was seen in Room 26 .   Patient ID: Ruth [name], female    DOB: December 29, 1980, 36 y.o.   MRN: [id number] Chief Complaint  Patient presents with  . Insomnia    atarax not working   HPI GINNETTE [name] is a 36 y.o. female Here for follow up on insomnia.   Insomnia She was last seen on Aug 2nd for multiple concerns at that time. She had used xanax 0.5 mg to sleep; option of hydroxyzine was given in place of benzodiazepine prescribed 12.5 to 25mg  qhs prn.   Patient states she's tried half pill (hydroxyzine 12.5mg ) the first night, and then switched to full pill (hydroxyzine 25mg ) the next 2 nights. She didn't feel any anxiousness from it. She still didn't sleep very well. She usually takes half pill of xanax 1mg  (0.5 mg). She denies any side effects from hydroxyzine. She still has 4 pills of xanax 1mg  at home.   Non specific neurologic symptoms See prior visit. She had normal TSH, CBC, sed rate, and rheumatoid factor. CT head was ordered but not yet completed. She did state she had previous mouth ulcers and previous sore joints. She had a non focal neuro exam on exam in office. She also had no focal joint swelling noted on her last exam. She had normal sed rate and normal RA factor.   Patient Active Problem List   Diagnosis Date Noted  . Chronic neck pain 12/29/2016  . Chronic pain syndrome 12/29/2016  . Nonallopathic lesion of cervical region 12/29/2016  . Nonallopathic lesion of thoracic region 12/29/2016  . Nonallopathic lesion of lumbosacral region 12/29/2016  . 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 HYPOTENSION 07/23/2007  . ACNE, MILD 07/23/2007   Past Medical History:  Diagnosis Date  . Allergy   . Arthritis   . Frequency-urgency  sydrome   . GERD (gastroesophageal reflux disease)    no meds  . PONV (postoperative nausea and vomiting)    Past Surgical History:  Procedure Laterality Date  . LAPAROSCOPY  04/19/2012   Procedure: LAPAROSCOPY DIAGNOSTIC;  Surgeon: Selinda Orion, MD;  Location: [facility];  Service: Gynecology;  Laterality: N/A;  left ovarian cystectomy with excision of BENIGN l cystic 7.6cm left teratoma WITH ENDO CATCH REMOVAL and cauterization of endometriosis  . nasal septoplasty  '11  . WISDOM TOOTH EXTRACTION     Allergies  Allergen Reactions  . Lactose Intolerance (Gi) Hives    ANY MILK  BASED PROTEIN  . Whey Protein [Protein] Hives   Prior to Admission medications   Medication Sig Start Date End Date Taking? Authorizing Provider  ALPRAZolam (XANAX) 0.25 MG tablet Take 0.25 mg by mouth at bedtime.   Yes [provider]  hydrOXYzine (ATARAX/VISTARIL) 25 MG tablet Take 0.5-1 tablets (12.5-25 mg total) by mouth at bedtime as needed for anxiety. 05/21/17  Yes Wendie Agreste, MD   Social History   Social History  . Marital status: Single    Spouse name: N/A  . Number of children: N/A  . Years of education: N/A   Occupational History  . Not on file.   Social History Main Topics  . Smoking status: Former Smoker    Packs/day: 1.00    Quit date: 04/12/2004  .  Smokeless tobacco: Never Used  . Alcohol use Yes     Comment: social  . Drug use: No  . Sexual activity: Yes   Other Topics Concern  . Not on file   Social History Narrative  . No narrative on file   Review of Systems  Constitutional: Negative for chills, fatigue, fever and unexpected weight change.  Respiratory: Negative for cough.   Gastrointestinal: Negative for constipation, diarrhea, nausea and vomiting.  Skin: Negative for rash and wound.  Neurological: Negative for dizziness, weakness and headaches.  Psychiatric/Behavioral: Positive for sleep disturbance.       Objective:   Physical Exam   Constitutional: She is oriented to person, place, and time. She appears well-developed and well-nourished. No distress.  HENT:  Head: Normocephalic and atraumatic.  Eyes: Pupils are equal, round, and reactive to light. EOM are normal.  Neck: Neck supple.  Cardiovascular: Normal rate.   Pulmonary/Chest: Effort normal. No respiratory distress.  Musculoskeletal: Normal range of motion.  Neurological: She is alert and oriented to person, place, and time.  Skin: Skin is warm and dry.  Psychiatric: She has a normal mood and affect. Her behavior is normal.  Nursing note and vitals reviewed.   Vitals:   06/01/17 1345  BP: 109/72  Pulse: 83  Resp: 16  Temp: 98.7 F (37.1 C)  TempSrc: Oral  SpO2: 98%  Weight: 144 lb (65.3 kg)  Height: 5' 4.5" (1.638 m)      Assessment & Plan:   [name] is a 36 y.o. female Psychophysiological insomnia - Plan: ALPRAZolam (XANAX) 0.5 MG tablet  - Can try higher dose of hydroxyzine 50 mg daily at bedtime. If this is not effective, can change back to Xanax 0.25-0.5 mg daily at bedtime when necessary. New prescription was provided for 0.5 mg tablet  Arthralgias, other neurologic symptoms from last visit have improved. Reviewed lab work from last visit as she was not able to review that through my chart. Previous workup was reassuring, advised to return if the symptoms return or new arthralgias/neurologic symptoms and can proceed with further workup.  Meds ordered this encounter  Medications  . ALPRAZolam (XANAX) 0.5 MG tablet    Sig: Take 0.5-1 tablets (0.25-0.5 mg total) by mouth at bedtime as needed for anxiety.    Dispense:  15 tablet    Refill:  0   Patient Instructions    Try 50mg  hydroxyzine for sleep. If that does not work, then can try change back to xanax - 0.25-0.5mg  at bedtime as needed.   Tests from last visit were reassuring. If you continue to improve, we may not need to refer to rheumatology or neuro. Recheck in next 6 weeks, or  sooner if the symptoms return.  In the meantime if you have any questions, please let me know.    IF you received an x-[name] today, you will receive an invoice from [facility]. Please contact [facility] at [phone] with questions or concerns regarding your invoice.   IF you received labwork today, you will receive an invoice from [facility]. Please contact [facility] at [phone] with questions or concerns regarding your invoice.   Our billing staff will not be able to assist you with questions regarding bills from these companies.  You will be contacted with the lab results as soon as they are available. The fastest way to get your results is to activate your My Chart account. Instructions are located on the last page of this paperwork. If you have  not heard from [place] regarding the results in 2 weeks, please contact this office.       I personally performed the services described in this documentation, which was scribed in my presence. The recorded information has been reviewed and considered for accuracy and completeness, addended by me as needed, and agree with information above.  Signed,   Ruth Ray, MD [facility].  06/01/17 4:13 PM

## 2017-06-01 NOTE — Patient Instructions (Addendum)
  Try 50mg  hydroxyzine for sleep. If that does not work, then can try change back to xanax - 0.25-0.5mg  at bedtime as needed.   Tests from last visit were reassuring. If you continue to improve, we may not need to refer to rheumatology or neuro. Recheck in next 6 weeks, or sooner if the symptoms return.  In the meantime if you have any questions, please let me know.    IF you received an x-ray today, you will receive an invoice from [facility]. Please contact [facility] at [phone] with questions or concerns regarding your invoice.   IF you received labwork today, you will receive an invoice from [place]. Please contact [facility] at [phone] with questions or concerns regarding your invoice.   Our billing staff will not be able to assist you with questions regarding bills from these companies.  You will be contacted with the lab results as soon as they are available. The fastest way to get your results is to activate your My Chart account. Instructions are located on the last page of this paperwork. If you have not heard from [place] regarding the results in 2 weeks, please contact this office.

## 2017-06-02 ENCOUNTER — Other Ambulatory Visit: Payer: BLUE CROSS/BLUE SHIELD

## 2017-06-18 ENCOUNTER — Other Ambulatory Visit: Payer: Self-pay | Admitting: Family Medicine

## 2017-06-18 DIAGNOSIS — F5104 Psychophysiologic insomnia: Secondary | ICD-10-CM

## 2017-06-18 NOTE — Telephone Encounter (Signed)
SS refill req Hydroxyzine Sent to [employer] for approval

## 2017-06-26 NOTE — Telephone Encounter (Signed)
Refilled

## 2017-07-02 ENCOUNTER — Ambulatory Visit (INDEPENDENT_AMBULATORY_CARE_PROVIDER_SITE_OTHER): Payer: BLUE CROSS/BLUE SHIELD | Admitting: Family Medicine

## 2017-07-02 DIAGNOSIS — F5104 Psychophysiologic insomnia: Secondary | ICD-10-CM

## 2017-07-02 MED ORDER — ALPRAZOLAM 0.5 MG PO TABS: 0.2500 mg | ORAL_TABLET | Freq: Every evening | ORAL | 1 refills | Status: DC | PRN

## 2017-07-02 NOTE — Progress Notes (Signed)
Subjective:  By signing my name below, I, [name], attest that this documentation has been prepared under the direction and in the presence of [name], MD. Electronically Signed: [name], [place]. 07/02/2017 , 2:43 PM .  Patient was seen in Room 12 .   Patient ID: Ruth [name], female    DOB: 1980/12/22, 36 y.o.   MRN: [id number] Chief Complaint  Patient presents with  . Insomnia    states everything is going okay  . Follow-up    tried the Hydroxyzine and it did not work;    HPI Ruth [name] is a 36 y.o. female  Here for follow up on insomnia. Patient was switched to try hydroxyzine from xanax. Then tried higher dose of hydroxyzine at the time.   Patient has tried hydroxyzine 50mg , about 5 different nights. She didn't sleep well at night, and felt drowsy in the morning. She believes it might be in her head, and plans to try mixing it with her mineral + electrolyte shake at night.   She was taking xanax 0.5mg  at night for sleep, been on it for 2.5-3 years. Sometimes it doesn't always work, but doesn't increase xanax dose. She has tried xanax 0.25mg  in the past, whenever she has longer and harder work days. She rarely drinks 2-3 glasses of wine and takes break in between, but denies binging. She denies SI/HI. Her plan is to cut down on medications, and relief through breathing and meditation. She denies any changes with anxiety or depression.   She is going to [employer] for work for a few weeks.   Patient Active Problem List   Diagnosis Date Noted  . Chronic neck pain 12/29/2016  . Chronic pain syndrome 12/29/2016  . Nonallopathic lesion of cervical region 12/29/2016  . Nonallopathic lesion of thoracic region 12/29/2016  . Nonallopathic lesion of lumbosacral region 12/29/2016  . 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 HYPOTENSION 07/23/2007  . ACNE, MILD 07/23/2007   Past Medical History:  Diagnosis Date  . Allergy   . Arthritis   . Frequency-urgency sydrome    . GERD (gastroesophageal reflux disease)    no meds  . PONV (postoperative nausea and vomiting)    Past Surgical History:  Procedure Laterality Date  . LAPAROSCOPY  04/19/2012   Procedure: LAPAROSCOPY DIAGNOSTIC;  Surgeon: Selinda Orion, MD;  Location: [facility];  Service: Gynecology;  Laterality: N/A;  left ovarian cystectomy with excision of BENIGN l cystic 7.6cm left teratoma WITH ENDO CATCH REMOVAL and cauterization of endometriosis  . nasal septoplasty  '11  . WISDOM TOOTH EXTRACTION     Allergies  Allergen Reactions  . Lactose Intolerance (Gi) Hives    ANY MILK  BASED PROTEIN  . Whey Protein [Protein] Hives   Prior to Admission medications   Medication Sig Start Date End Date Taking? Authorizing Provider  ALPRAZolam [name]) 0.5 MG tablet Take 0.5-1 tablets (0.25-0.5 mg total) by mouth at bedtime as needed for anxiety. 06/01/17  Yes Wendie Agreste, MD  hydrOXYzine (ATARAX/VISTARIL) 25 MG tablet TAKE 1/2 TO 1 TABLET BY MOUTH AT BEDTIME AS NEEDED FOR ANXIETY Patient not taking: Reported on 07/02/2017 06/26/17   Wendie Agreste, MD   Social History   Social History  . Marital status: Single    Spouse name: N/A  . Number of children: N/A  . Years of education: N/A   Occupational History  . Not on file.   Social History Main Topics  .  Smoking status: Former Smoker    Packs/day: 1.00    Quit date: 04/12/2004  . Smokeless tobacco: Never Used  . Alcohol use Yes     Comment: social  . Drug use: No  . Sexual activity: Yes   Other Topics Concern  . Not on file   Social History Narrative  . No narrative on file   Review of Systems  Constitutional: Negative for chills, fatigue, fever and unexpected weight change.  Respiratory: Negative for cough.   Gastrointestinal: Negative for constipation, diarrhea, nausea and vomiting.  Skin: Negative for rash and wound.  Neurological: Negative for dizziness, weakness and headaches.       Objective:    Physical Exam  Constitutional: She is oriented to person, place, and time. She appears well-developed and well-nourished. No distress.  HENT:  Head: Normocephalic and atraumatic.  Eyes: Pupils are equal, round, and reactive to light. EOM are normal.  Neck: Neck supple.  Cardiovascular: Normal rate.   Pulmonary/Chest: Effort normal. No respiratory distress.  Musculoskeletal: Normal range of motion.  Neurological: She is alert and oriented to person, place, and time.  Skin: Skin is warm and dry.  Psychiatric: She has a normal mood and affect. Her behavior is normal.  Nursing note and vitals reviewed.   Vitals:   07/02/17 1409  BP: 105/68  Pulse: 80  Resp: 16  Temp: 98.4 F (36.9 C)  TempSrc: Oral  SpO2: 96%  Weight: 142 lb (64.4 kg)  Height: 5' 4.5" (1.638 m)      Assessment & Plan:   Ruth [name] is a 36 y.o. female Psychophysiological insomnia - Plan: ALPRAZolam (XANAX) 0.5 MG tablet  Attempted treatment with hydroxyzine, she still plans on trying that again, but at 50 mg dose that have daytime sedation. Previously tolerated Xanax without increased need or other side effects.  -Refilled Xanax 0.5 mg 1/2-1 daily at bedtime when necessary. Recheck in the next 2-3 months.  -Continue stress management and relaxation techniques for sleep.  Meds ordered this encounter  Medications  . ALPRAZolam (XANAX) 0.5 MG tablet    Sig: Take 0.5-1 tablets (0.25-0.5 mg total) by mouth at bedtime as needed for anxiety.    Dispense:  30 tablet    Refill:  1   Patient Instructions    Ok to return to alprazolam 1/2 - 1 at bedtime as needed. Continue those other relaxation techniques and stress management. Follow up in 2-3 months. Return to the clinic or go to the nearest emergency room if any of your symptoms worsen or new symptoms occur.    IF you received an x-ray today, you will receive an invoice from [facility]. Please contact [facility] at [phone] with  questions or concerns regarding your invoice.   IF you received labwork today, you will receive an invoice from [place]. Please contact [facility] at [phone] with questions or concerns regarding your invoice.   Our billing staff will not be able to assist you with questions regarding bills from these companies.  You will be contacted with the lab results as soon as they are available. The fastest way to get your results is to activate your My Chart account. Instructions are located on the last page of this paperwork. If you have not heard from [place] regarding the results in 2 weeks, please contact this office.       I personally performed the services described in this documentation, which was scribed in my presence. The recorded information has been reviewed and  considered for accuracy and completeness, addended by me as needed, and agree with information above.  Signed,   [name], MD [facility].  07/04/17 1:06 PM

## 2017-07-02 NOTE — Patient Instructions (Addendum)
  Ok to return to alprazolam 1/2 - 1 at bedtime as needed. Continue those other relaxation techniques and stress management. Follow up in 2-3 months. Return to the clinic or go to the nearest emergency room if any of your symptoms worsen or new symptoms occur.    IF you received an x-ray today, you will receive an invoice from [facility]. Please contact [facility] at [phone] with questions or concerns regarding your invoice.   IF you received labwork today, you will receive an invoice from [place]. Please contact [facility] at [phone] with questions or concerns regarding your invoice.   Our billing staff will not be able to assist you with questions regarding bills from these companies.  You will be contacted with the lab results as soon as they are available. The fastest way to get your results is to activate your My Chart account. Instructions are located on the last page of this paperwork. If you have not heard from [place] regarding the results in 2 weeks, please contact this office.

## 2017-07-25 NOTE — Progress Notes (Deleted)
[name] [place], [place] [address] Phone: [phone] Subjective:    I'm seeing this patient by the request  of:    CC: Low back pain, fibromyalgia follow-up  [id number]  Ruth [name] is a 36 y.o. female coming in with complaint of neck, back pain and polymyalgia. Respiratory history significant for chronic pain syndrome. Saw me 6 months ago. Patient was lost to follow-up. Had mainly change with patient having a low dose of Effexor.  Onset-  Location Duration-  Character- Aggravating factors- Reliving factors-  Therapies tried-  Severity-     Past Medical History:  Diagnosis Date  . Allergy   . Arthritis   . Frequency-urgency sydrome   . GERD (gastroesophageal reflux disease)    no meds  . PONV (postoperative nausea and vomiting)    Past Surgical History:  Procedure Laterality Date  . LAPAROSCOPY  04/19/2012   Procedure: LAPAROSCOPY DIAGNOSTIC;  Surgeon: Selinda Orion, MD;  Location: [facility];  Service: Gynecology;  Laterality: N/A;  left ovarian cystectomy with excision of BENIGN l cystic 7.6cm left teratoma WITH ENDO CATCH REMOVAL and cauterization of endometriosis  . nasal septoplasty  '11  . WISDOM TOOTH EXTRACTION     Social History   Social History  . Marital status: Single    Spouse name: N/A  . Number of children: N/A  . Years of education: N/A   Social History Main Topics  . Smoking status: Former Smoker    Packs/day: 1.00    Quit date: 04/12/2004  . Smokeless tobacco: Never Used  . Alcohol use Yes     Comment: social  . Drug use: No  . Sexual activity: Yes   Other Topics Concern  . Not on file   Social History Narrative  . No narrative on file   Allergies  Allergen Reactions  . Lactose Intolerance (Gi) Hives    ANY MILK  BASED PROTEIN  . Whey Protein [Protein] Hives   Family History  Problem Relation Age of Onset  . Stroke Father   . Cancer Maternal Grandmother   .  Diabetes Maternal Grandmother      Past medical history, social, surgical and family history all reviewed in electronic medical record.  No pertanent information unless stated regarding to the chief complaint.   Review of Systems:Review of systems updated and as accurate as of 07/25/17  No headache, visual changes, nausea, vomiting, diarrhea, constipation, dizziness, abdominal pain, skin rash, fevers, chills, night sweats, weight loss, swollen lymph nodes, body aches, joint swelling, muscle aches, chest pain, shortness of breath, mood changes.   Objective  There were no vitals taken for this visit. Systems examined below as of 07/25/17   General: No apparent distress alert and oriented x3 mood and affect normal, dressed appropriately.  HEENT: Pupils equal, extraocular movements intact  Respiratory: Patient's speak in full sentences and does not appear short of breath  Cardiovascular: No lower extremity edema, non tender, no erythema  Skin: Warm dry intact with no signs of infection or rash on extremities or on axial skeleton.  Abdomen: Soft nontender  Neuro: Cranial nerves II through XII are intact, neurovascularly intact in all extremities with 2+ DTRs and 2+ pulses.  Lymph: No lymphadenopathy of posterior or anterior cervical chain or axillae bilaterally.  Gait normal with good balance and coordination.  MSK:  Non tender with full range of motion and good stability and symmetric strength and tone of shoulders, elbows,  wrist, hip, knee and ankles bilaterally.     Impression and Recommendations:     This case required medical decision making of moderate complexity.      Note: This dictation was prepared with Dragon dictation along with smaller phrase technology. Any transcriptional errors that result from this process are unintentional.

## 2017-07-27 ENCOUNTER — Ambulatory Visit: Payer: BLUE CROSS/BLUE SHIELD | Admitting: Family Medicine

## 2017-07-27 ENCOUNTER — Other Ambulatory Visit: Payer: Self-pay | Admitting: Family Medicine

## 2017-07-27 DIAGNOSIS — F5104 Psychophysiologic insomnia: Secondary | ICD-10-CM

## 2017-08-17 ENCOUNTER — Other Ambulatory Visit: Payer: Self-pay | Admitting: Family Medicine

## 2017-08-17 DIAGNOSIS — F5104 Psychophysiologic insomnia: Secondary | ICD-10-CM

## 2017-08-17 NOTE — Telephone Encounter (Signed)
Please advise 

## 2017-08-25 ENCOUNTER — Encounter: Payer: Self-pay | Admitting: Family Medicine

## 2017-08-25 ENCOUNTER — Ambulatory Visit: Payer: BLUE CROSS/BLUE SHIELD | Admitting: Family Medicine

## 2017-08-25 VITALS — BP 108/71 | HR 84 | Temp 98.4°F | Resp 16 | Ht 64.5 in | Wt 145.6 lb

## 2017-08-25 DIAGNOSIS — F5104 Psychophysiologic insomnia: Secondary | ICD-10-CM

## 2017-08-25 DIAGNOSIS — K6289 Other specified diseases of anus and rectum: Secondary | ICD-10-CM | POA: Diagnosis not present

## 2017-08-25 DIAGNOSIS — K602 Anal fissure, unspecified: Secondary | ICD-10-CM | POA: Diagnosis not present

## 2017-08-25 MED ORDER — DILTIAZEM GEL 2 %: 1.0000 "application " | Freq: Two times a day (BID) | CUTANEOUS | 1 refills | Status: DC

## 2017-08-25 MED ORDER — ALPRAZOLAM 0.5 MG PO TABS: 0.2500 mg | ORAL_TABLET | Freq: Every evening | ORAL | 1 refills | Status: DC | PRN

## 2017-08-25 NOTE — Progress Notes (Signed)
Subjective:    Patient ID: Ruth [name], female    DOB: 01-Dec-1980, 36 y.o.   MRN: [id number]  HPI Ruth [name] is a 36 y.o. female Presents today for: Chief Complaint  Patient presents with  . Medication Refill    Xanax  . rectum issues    has history of rectum issues; issue is not getting better; been seen by GI  Here for follow up:  Insomnia: See prior visits, we have tried switching from Xanax to hydroxyzine. One last discussed September 13, was not achieving significant relief with 25 mg dose, and tried 50mg  a few times with some drowsiness next morning, but planned on trying to change her electrolyte and mineral shake at night to see if that would help. Option given to return to Xanax 1/2-1 at bedtime as needed.  Tried 0.25mg  xanax at few times at bedtime, but was waking up frequently, but studying and other "variables" with stress, studying. 0.5mg  helps sleep better.  Tried hydroxyzine 50mg , similar relief as xanax, but more groggy next day.  Would like to keep trying both options, working on light therapy, breathing exercises. Has about 10 xanax left.  No SI depression, no change in alcohol use (about 2-3 glasses every 2-3 days).   Also presents with concern of rectal issues today. About 1.5 -2 years ago - food poisoning after [place] food with diarrhea, blood in stool, treated with antibiotics, but unknown bacteria or cause. Had anal fissure with those symptoms. 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 Had painful BM's every time since then, She is followed by gastroenterology, Dr. Collene Mares. Seen 1 year ago. Tx: probiotics, and miralax 1/2 cap miralax every day. Did not see change in symptoms after 6 months of probiotics. Possible concern of rectal prolapse? Some talk of some type of scan. Not sure. BM QD, soft stools, just painful and blood with wiping every time. Topical lidocaine every other BM. Not topical diltiazem.  Last GI appt 6-7 months ago.      Patient Active Problem List   Diagnosis Date Noted  . Chronic neck pain 12/29/2016  . Chronic pain syndrome 12/29/2016  . Nonallopathic lesion of cervical region 12/29/2016  . Nonallopathic lesion of thoracic region 12/29/2016  . Nonallopathic lesion of lumbosacral region 12/29/2016  . 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 HYPOTENSION 07/23/2007  . ACNE, MILD 07/23/2007   Past Medical History:  Diagnosis Date  . Allergy   . Arthritis   . Frequency-urgency sydrome   . GERD (gastroesophageal reflux disease)    no meds  . PONV (postoperative nausea and vomiting)    Past Surgical History:  Procedure Laterality Date  . nasal septoplasty  '11  . WISDOM TOOTH EXTRACTION     Allergies  Allergen Reactions  . Lactose Intolerance (Gi) Hives    ANY MILK  BASED PROTEIN  . Whey Protein [Protein] Hives   Prior to Admission medications   Medication Sig Start Date End Date Taking? Authorizing Provider  ALPRAZolam [name]) 0.5 MG tablet Take 0.5-1 tablets (0.25-0.5 mg total) by mouth at bedtime as needed for anxiety. 07/02/17  Yes Wendie Agreste, MD   Social History   Socioeconomic History  . Marital status: Single    Spouse name: Not on file  . Number of children: Not on file  . Years of education: Not on file  . Highest education level: Not on file  Social Needs  . Financial resource strain: Not on  file  . Food insecurity - worry: Not on file  . Food insecurity - inability: Not on file  . Transportation needs - medical: Not on file  . Transportation needs - non-medical: Not on file  Occupational History  . Not on file  Tobacco Use  . Smoking status: Former Smoker    Packs/day: 1.00    Last attempt to quit: 04/12/2004    Years since quitting: 13.3  . Smokeless tobacco: Never Used  Substance and Sexual Activity  . Alcohol use: Yes    Comment: social  . Drug use: No  . Sexual activity: Yes  Other Topics Concern  . Not on file  Social History Narrative  . Not on  file    Review of Systems  Gastrointestinal: Positive for blood in stool (blood with wiping - at anus, not in stool ) and rectal pain. Negative for nausea.  Psychiatric/Behavioral: Positive for sleep disturbance. The patient is not nervous/anxious.        Objective:   Physical Exam  Constitutional: She is oriented to person, place, and time. She appears well-developed and well-nourished.  HENT:  Head: Normocephalic and atraumatic.  Eyes: Conjunctivae and EOM are normal. Pupils are equal, round, and reactive to light.  Neck: Carotid bruit is not present.  Cardiovascular: Normal rate, regular rhythm, normal heart sounds and intact distal pulses.  Pulmonary/Chest: Effort normal and breath sounds normal.  Abdominal: Soft. She exhibits no pulsatile midline mass. There is no tenderness.  Genitourinary: Rectal exam shows fissure (at 12:00, no active bleeding - ttp ).  Neurological: She is alert and oriented to person, place, and time.  Skin: Skin is warm and dry.  Psychiatric: She has a normal mood and affect. Her behavior is normal.  Vitals reviewed.  Vitals:   08/25/17 1337  BP: 108/71  Pulse: 84  Resp: 16  Temp: 98.4 F (36.9 C)  TempSrc: Oral  SpO2: 99%  Weight: 145 lb 9.6 oz (66 kg)  Height: 5' 4.5" (1.638 m)         Assessment & Plan:   Ruth [name] is a 36 y.o. female Anal fissure - Plan: diltiazem 2 % GEL Anal pain - Plan: diltiazem 2 % GEL  - Based on exam and symptoms, appears to be due to anal fissure, but long-standing symptoms for past 1-1/2 years, denies constipation.  -Continue lidocaine topical, added diltiazem gel to see if that may be helpful, continue MiraLAX to keep stools soft  - would like her to follow up with gastroenterology due to duration of symptoms to determine if other imaging/evaluation needed  Psychophysiological insomnia - Plan: ALPRAZolam (XANAX) 0.5 MG tablet  - Still appears that Xanax may provide better insomnia relief. Some  situational stressors right now, but denies need for daily anxiety medication.   - Continue Xanax 0.25-0.5 mg daily at bedtime when necessary, had been on that medication for some time without difficulty. She still plans on trying hydroxyzine to see if that may be just as effective without daytime somnolence.  - recheck 3 months.   Meds ordered this encounter  Medications  . ALPRAZolam (XANAX) 0.5 MG tablet    Sig: Take 0.5-1 tablets (0.25-0.5 mg total) at bedtime as needed by mouth for anxiety.    Dispense:  30 tablet    Refill:  1  . diltiazem 2 % GEL    Sig: Apply 1 application 2 (two) times daily topically. As needed for fissure    Dispense:  30 g  Refill:  1   Patient Instructions    Xanax at lowest effective dose OR hydroxyzine at dose that helps sleep, but does not cause significant sedation next day.   For rectal bleeding and pain, I would recommend meeting with gastroenterologist to discuss next step in evaluation in treatment. Call their office for appointment. In the meantime continue the lidocaine gel, as well as  diltiazem gel prior to bowel movements to see if that will be helpful.  Recheck in 3 months, sooner if any worsening symptoms.     IF you received an x-ray today, you will receive an invoice from [facility]. Please contact [facility] at [phone] with questions or concerns regarding your invoice.   IF you received labwork today, you will receive an invoice from [place]. Please contact [facility] at [phone] with questions or concerns regarding your invoice.   Our billing staff will not be able to assist you with questions regarding bills from these companies.  You will be contacted with the lab results as soon as they are available. The fastest way to get your results is to activate your My Chart account. Instructions are located on the last page of this paperwork. If you have not heard from [place] regarding the results in 2 weeks, please  contact this office.       Signed,   [name], MD [facility] [place].  08/25/17 2:34 PM

## 2017-08-25 NOTE — Patient Instructions (Addendum)
  Xanax at lowest effective dose OR hydroxyzine at dose that helps sleep, but does not cause significant sedation next day.   For rectal bleeding and pain, I would recommend meeting with gastroenterologist to discuss next step in evaluation in treatment. Call their office for appointment. In the meantime continue the lidocaine gel, as well as  diltiazem gel prior to bowel movements to see if that will be helpful.  Recheck in 3 months, sooner if any worsening symptoms.     IF you received an x-ray today, you will receive an invoice from [facility]. Please contact [facility]iology at [phone] with questions or concerns regarding your invoice.   IF you received labwork today, you will receive an invoice from [place]. Please contact [facility] at [phone] with questions or concerns regarding your invoice.   Our billing staff will not be able to assist you with questions regarding bills from these companies.  You will be contacted with the lab results as soon as they are available. The fastest way to get your results is to activate your My Chart account. Instructions are located on the last page of this paperwork. If you have not heard from [place] regarding the results in 2 weeks, please contact this office.

## 2017-10-29 ENCOUNTER — Ambulatory Visit: Payer: BLUE CROSS/BLUE SHIELD | Admitting: Family Medicine

## 2017-11-02 ENCOUNTER — Ambulatory Visit: Payer: BLUE CROSS/BLUE SHIELD | Admitting: Family Medicine

## 2017-11-03 ENCOUNTER — Ambulatory Visit (INDEPENDENT_AMBULATORY_CARE_PROVIDER_SITE_OTHER): Payer: BLUE CROSS/BLUE SHIELD | Admitting: Family Medicine

## 2017-11-03 ENCOUNTER — Other Ambulatory Visit: Payer: Self-pay

## 2017-11-03 ENCOUNTER — Encounter: Payer: Self-pay | Admitting: Family Medicine

## 2017-11-03 VITALS — BP 102/68 | HR 96 | Temp 99.0°F | Resp 16 | Ht 66.14 in | Wt 147.0 lb

## 2017-11-03 DIAGNOSIS — R790 Abnormal level of blood mineral: Secondary | ICD-10-CM | POA: Diagnosis not present

## 2017-11-03 DIAGNOSIS — E538 Deficiency of other specified B group vitamins: Secondary | ICD-10-CM

## 2017-11-03 DIAGNOSIS — K121 Other forms of stomatitis: Secondary | ICD-10-CM

## 2017-11-03 DIAGNOSIS — R7989 Other specified abnormal findings of blood chemistry: Secondary | ICD-10-CM

## 2017-11-03 DIAGNOSIS — R400 Somnolence: Secondary | ICD-10-CM

## 2017-11-03 DIAGNOSIS — F5104 Psychophysiologic insomnia: Secondary | ICD-10-CM | POA: Diagnosis not present

## 2017-11-03 MED ORDER — HYDROXYZINE HCL 25 MG PO TABS: 25.0000 mg | ORAL_TABLET | Freq: Every evening | ORAL | 0 refills | Status: DC | PRN

## 2017-11-03 MED ORDER — ALPRAZOLAM 0.5 MG PO TABS: 0.2500 mg | ORAL_TABLET | Freq: Every evening | ORAL | 2 refills | Status: DC | PRN

## 2017-11-03 NOTE — Progress Notes (Signed)
Subjective:  By signing my name below, I, Ruth [name], attest that this documentation has been prepared under the direction and in the presence of Ruth Ray, MD. Electronically Signed: [name], [place]. 11/03/2017 , 1:49 PM .  Patient was seen in Room 3 .   Patient ID: Ruth [name], female    DOB: [birth year], 37 y.o.   MRN: [id number] Chief Complaint  Patient presents with  . Anxiety    3 month follow-up   . Medication Refill    Alprazolam   HPI Ruth [name] is a 37 y.o. female Here for medication refills. I last saw her in Nov for suspected anal fissure. Also discussed insomnia at that time.   She is a vegetarian.   Insomnia We have tried changing from xanax to hydroxyzine including with various dose changes. Did not have relief with 25mg  and too much sedation with 50mg . Also tried lower dose of xanax 0.25mg  but frequent wakening. She denied depression or worsening anxiety symptoms at that time. Planned on light therapy and supplement medication use.   Patient has been having improvement with her generalized anxiety with Vitamin B-complex and Magnesium supplements. She started taking the supplements about 4-6 weeks after home lab panel. She's tried taking xanax 0.25mg  at night, but if she wants a more predictable stable night, she takes xanax 0.5mg . She also mentions using hydroxyzine for dust mite allergy flare ups; has 6-8 tablets left. She also reports waking up about 6-8 times a night, feeling sleepy during the day. She denies falling asleep during the day. She denies SI/HI. She denies alcohol use, as she's doing a weight cut.   Anal fissure She went to GI about the rectal bleeding and pain. She was informed having some structural issues there. She mentioned also having some ulcers and cracking in her lips ongoing for a long time. She did a home mineral lab panel about 4-6 weeks ago, and was low on B-complex and Magnesium. Her mouth ulcers have stopped and the anal  fissure resolved.   Patient Active Problem List   Diagnosis Date Noted  . Chronic neck pain 12/29/2016  . Chronic pain syndrome 12/29/2016  . Nonallopathic lesion of cervical region 12/29/2016  . Nonallopathic lesion of thoracic region 12/29/2016  . Nonallopathic lesion of lumbosacral region 12/29/2016  . 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 HYPOTENSION 07/23/2007  . ACNE, MILD 07/23/2007   Past Medical History:  Diagnosis Date  . Allergy   . Arthritis   . Frequency-urgency sydrome   . GERD (gastroesophageal reflux disease)    no meds  . PONV (postoperative nausea and vomiting)    Past Surgical History:  Procedure Laterality Date  . LAPAROSCOPY  04/19/2012   Procedure: LAPAROSCOPY DIAGNOSTIC;  Surgeon: Selinda Orion, MD;  Location: [facility];  Service: Gynecology;  Laterality: N/A;  left ovarian cystectomy with excision of BENIGN l cystic 7.6cm left teratoma WITH ENDO CATCH REMOVAL and cauterization of endometriosis  . nasal septoplasty  '11  . WISDOM TOOTH EXTRACTION     Allergies  Allergen Reactions  . Lactose Intolerance (Gi) Hives    ANY MILK  BASED PROTEIN  . Whey Protein [Protein] Hives   Prior to Admission medications   Medication Sig Start Date End Date Taking? Authorizing Provider  ALPRAZolam [name]) 0.5 MG tablet Take 0.5-1 tablets (0.25-0.5 mg total) at bedtime as needed by mouth for anxiety. 08/25/17  Yes Wendie Agreste, MD   Social History   Socioeconomic  History  . Marital status: Single    Spouse name: Not on file  . Number of children: Not on file  . Years of education: Not on file  . Highest education level: Not on file  Social Needs  . Financial resource strain: Not on file  . Food insecurity - worry: Not on file  . Food insecurity - inability: Not on file  . Transportation needs - medical: Not on file  . Transportation needs - non-medical: Not on file  Occupational History  . Not on file  Tobacco Use  . Smoking status:  Former Smoker    Packs/day: 1.00    Last attempt to quit: 04/12/2004    Years since quitting: 13.5  . Smokeless tobacco: Never Used  Substance and Sexual Activity  . Alcohol use: Yes    Comment: social  . Drug use: No  . Sexual activity: Yes  Other Topics Concern  . Not on file  Social History Narrative  . Not on file   Review of Systems  Constitutional: Positive for fatigue. Negative for chills, fever and unexpected weight change.  Respiratory: Negative for cough.   Gastrointestinal: Negative for constipation, diarrhea, nausea and vomiting.  Skin: Negative for rash and wound.  Neurological: Negative for dizziness, weakness and headaches.  Psychiatric/Behavioral: Positive for sleep disturbance. Negative for self-injury and suicidal ideas.       Objective:   Physical Exam  Constitutional: She is oriented to person, place, and time. She appears well-developed and well-nourished. No distress.  HENT:  Head: Normocephalic and atraumatic.  Slight dry lips, no ulcers of mouth  Eyes: EOM are normal. Pupils are equal, round, and reactive to light.  Neck: Neck supple. No thyromegaly present.  Cardiovascular: Normal rate.  Pulmonary/Chest: Effort normal. No respiratory distress.  Musculoskeletal: Normal range of motion.  Neurological: She is alert and oriented to person, place, and time.  Skin: Skin is warm and dry.  Psychiatric: She has a normal mood and affect. Her behavior is normal.  Nursing note and vitals reviewed.   Vitals:   11/03/17 1330  BP: 102/68  Pulse: 96  Resp: 16  Temp: 99 F (37.2 C)  TempSrc: Oral  SpO2: 98%  Weight: 147 lb (66.7 kg)  Height: 5' 6.14" (1.68 m)      Assessment & Plan:   Ruth [name] is a 37 y.o. female Psychophysiological insomnia - Plan: Ambulatory referral to Sleep Studies, ALPRAZolam (XANAX) 0.5 MG tablet, hydrOXYzine (ATARAX/VISTARIL) 25 MG tablet Daytime somnolence - Plan: Ambulatory referral to Sleep Studies  - Still suspected  psychophysiological insomnia, but frequent awakening, daytime somnolence, will have evaluation with sleep specialist.  -Alprazolam 0.5 mg appears to be best treatment after previous trials, but will try hydroxyzine again at 25-37.5 mg as overall anxiety symptoms have improved. Both prescriptions given, but advised to take one or the other.  - Recheck in 3 months.  Low serum vitamin B12 - Plan: Vitamin B12 Low magnesium level - Plan: Magnesium  - Reported low levels on previous testing, now symptoms have improved with mouth ulcers with B12 and magnesium supplementation. We'll check levels of both. Okay to continue over-the-counter supplementation  Mouth ulcers  - As above symptoms have improved with B12/magnesium supplementation. RTC precautions if worsening symptoms  Meds ordered this encounter  Medications  . ALPRAZolam (XANAX) 0.5 MG tablet    Sig: Take 0.5-1 tablets (0.25-0.5 mg total) by mouth at bedtime as needed for anxiety.    Dispense:  30 tablet  Refill:  2  . hydrOXYzine (ATARAX/VISTARIL) 25 MG tablet    Sig: Take 1-1.5 tablets (25-37.5 mg total) by mouth at bedtime as needed.    Dispense:  30 tablet    Refill:  0   Patient Instructions    Ok to try hydroxyzine again at 25mg  tablet - 1 up to 1 and 1/2 tablets at bedtime OR alprazolam. I will also refer you to sleep specialist to see if any sleep testing needed. If hydroxyzine is working better, let me know so I can send in refills. Otherwise I did prescribe alprazolam if needed until next visit.  Ok to continue magnesium and B complex, and I will check those levels today.   Recheck 3 months     IF you received an x-[name] today, you will receive an invoice from [facility]. Please contact [facility] Radiology at [phone] with questions or concerns regarding your invoice.   IF you received labwork today, you will receive an invoice from [place]. Please contact [facility] at [phone] with questions or  concerns regarding your invoice.   Our billing staff will not be able to assist you with questions regarding bills from these companies.  You will be contacted with the lab results as soon as they are available. The fastest way to get your results is to activate your My Chart account. Instructions are located on the last page of this paperwork. If you have not heard from [place] regarding the results in 2 weeks, please contact this office.       I personally performed the services described in this documentation, which was scribed in my presence. The recorded information has been reviewed and considered for accuracy and completeness, addended by me as needed, and agree with information above.  Signed,   Ruth Ray, MD [facility] [place].  11/03/17 2:00 PM

## 2017-11-03 NOTE — Patient Instructions (Addendum)
  Ok to try hydroxyzine again at 25mg  tablet - 1 up to 1 and 1/2 tablets at bedtime OR alprazolam. I will also refer you to sleep specialist to see if any sleep testing needed. If hydroxyzine is working better, let me know so I can send in refills. Otherwise I did prescribe alprazolam if needed until next visit.  Ok to continue magnesium and B complex, and I will check those levels today.   Recheck 3 months     IF you received an x-ray today, you will receive an invoice from [facility]. Please contact [facility]iology at [phone] with questions or concerns regarding your invoice.   IF you received labwork today, you will receive an invoice from [place]. Please contact [facility] at [phone] with questions or concerns regarding your invoice.   Our billing staff will not be able to assist you with questions regarding bills from these companies.  You will be contacted with the lab results as soon as they are available. The fastest way to get your results is to activate your My Chart account. Instructions are located on the last page of this paperwork. If you have not heard from [place] regarding the results in 2 weeks, please contact this office.

## 2017-11-04 LAB — VITAMIN B12: Vitamin B-12: 964 pg/mL (ref 232–1245)

## 2017-11-04 LAB — MAGNESIUM: Magnesium: 2.5 mg/dL — ABNORMAL HIGH (ref 1.6–2.3)

## 2017-11-22 IMAGING — DX DG CERVICAL SPINE 2 OR 3 VIEWS
3 series · 3 of 3 positions shown · non-contrast
Comparison: None.

CLINICAL DATA: Worsening neck and right shoulder pain for the past
4 months, no known injury.

EXAM:
CERVICAL SPINE - 2-3 VIEW

[c-spine lat]
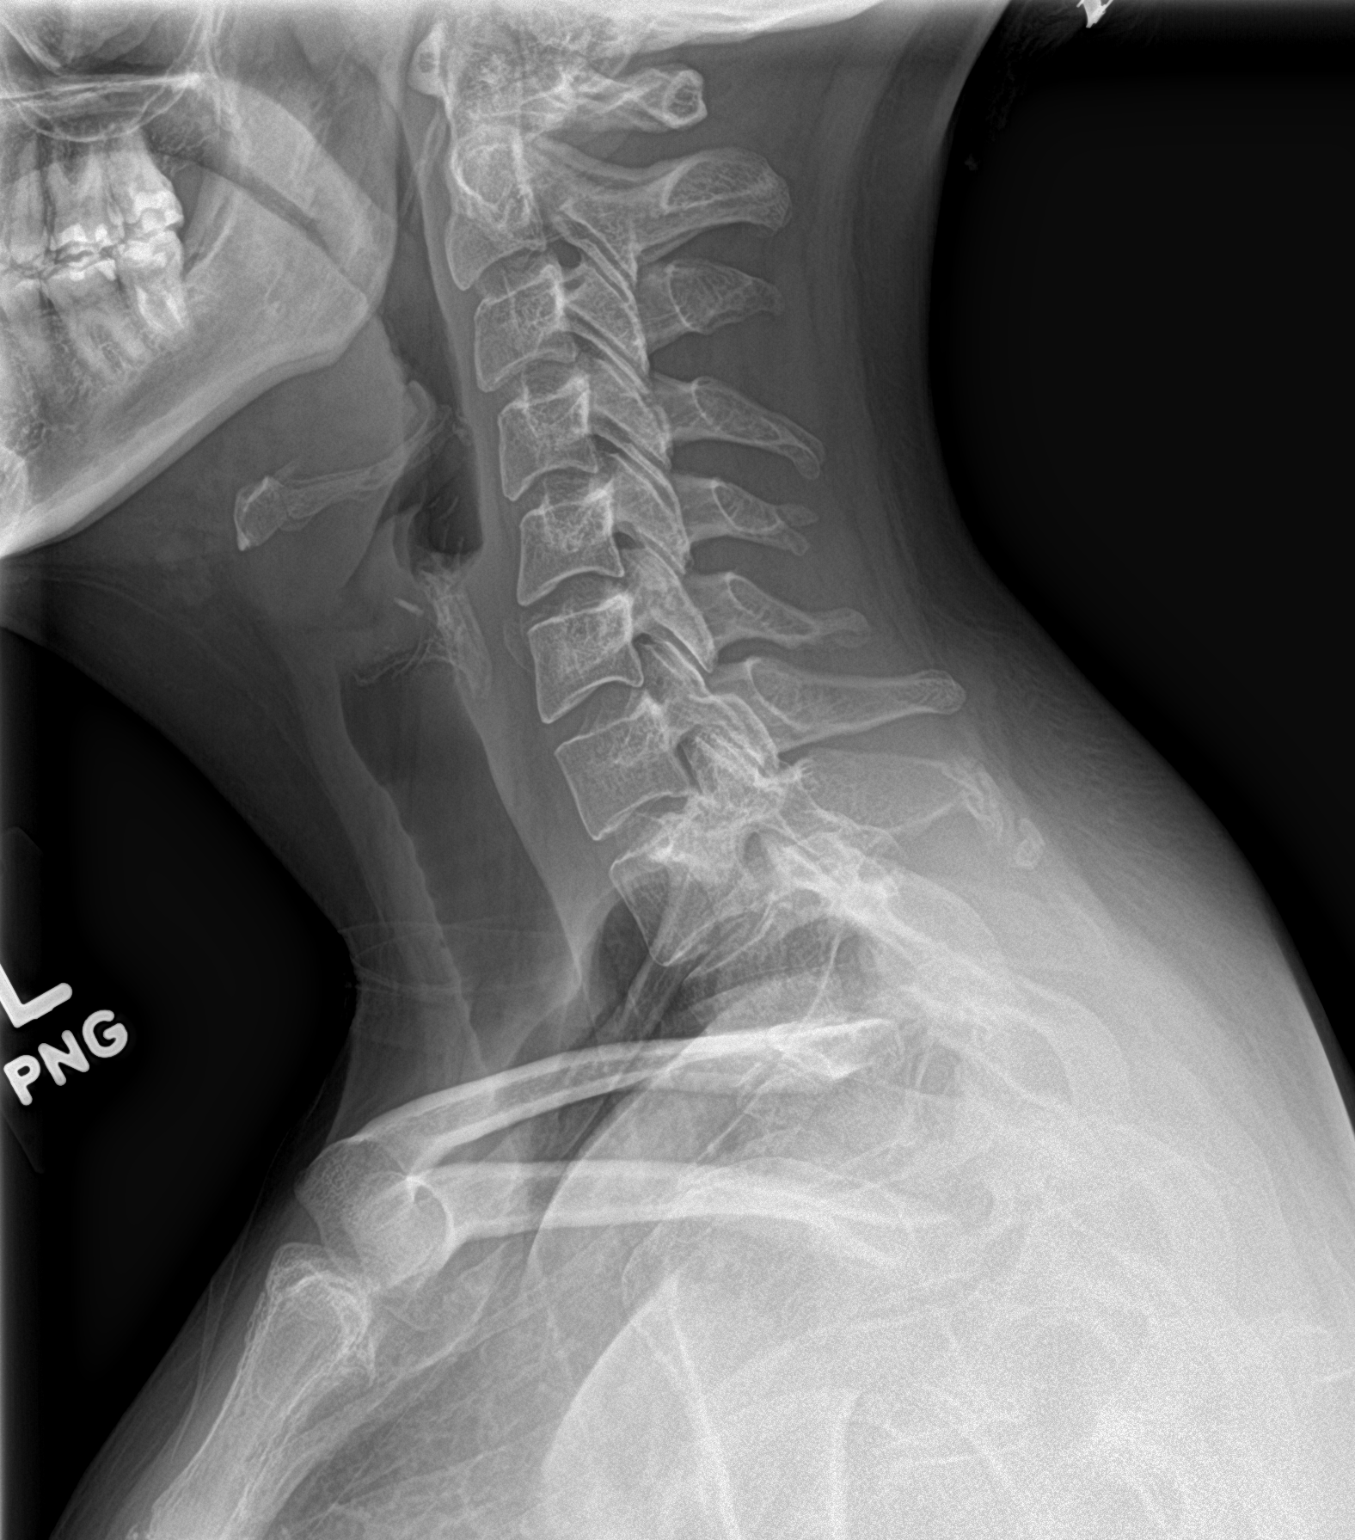

[c-spine ap]
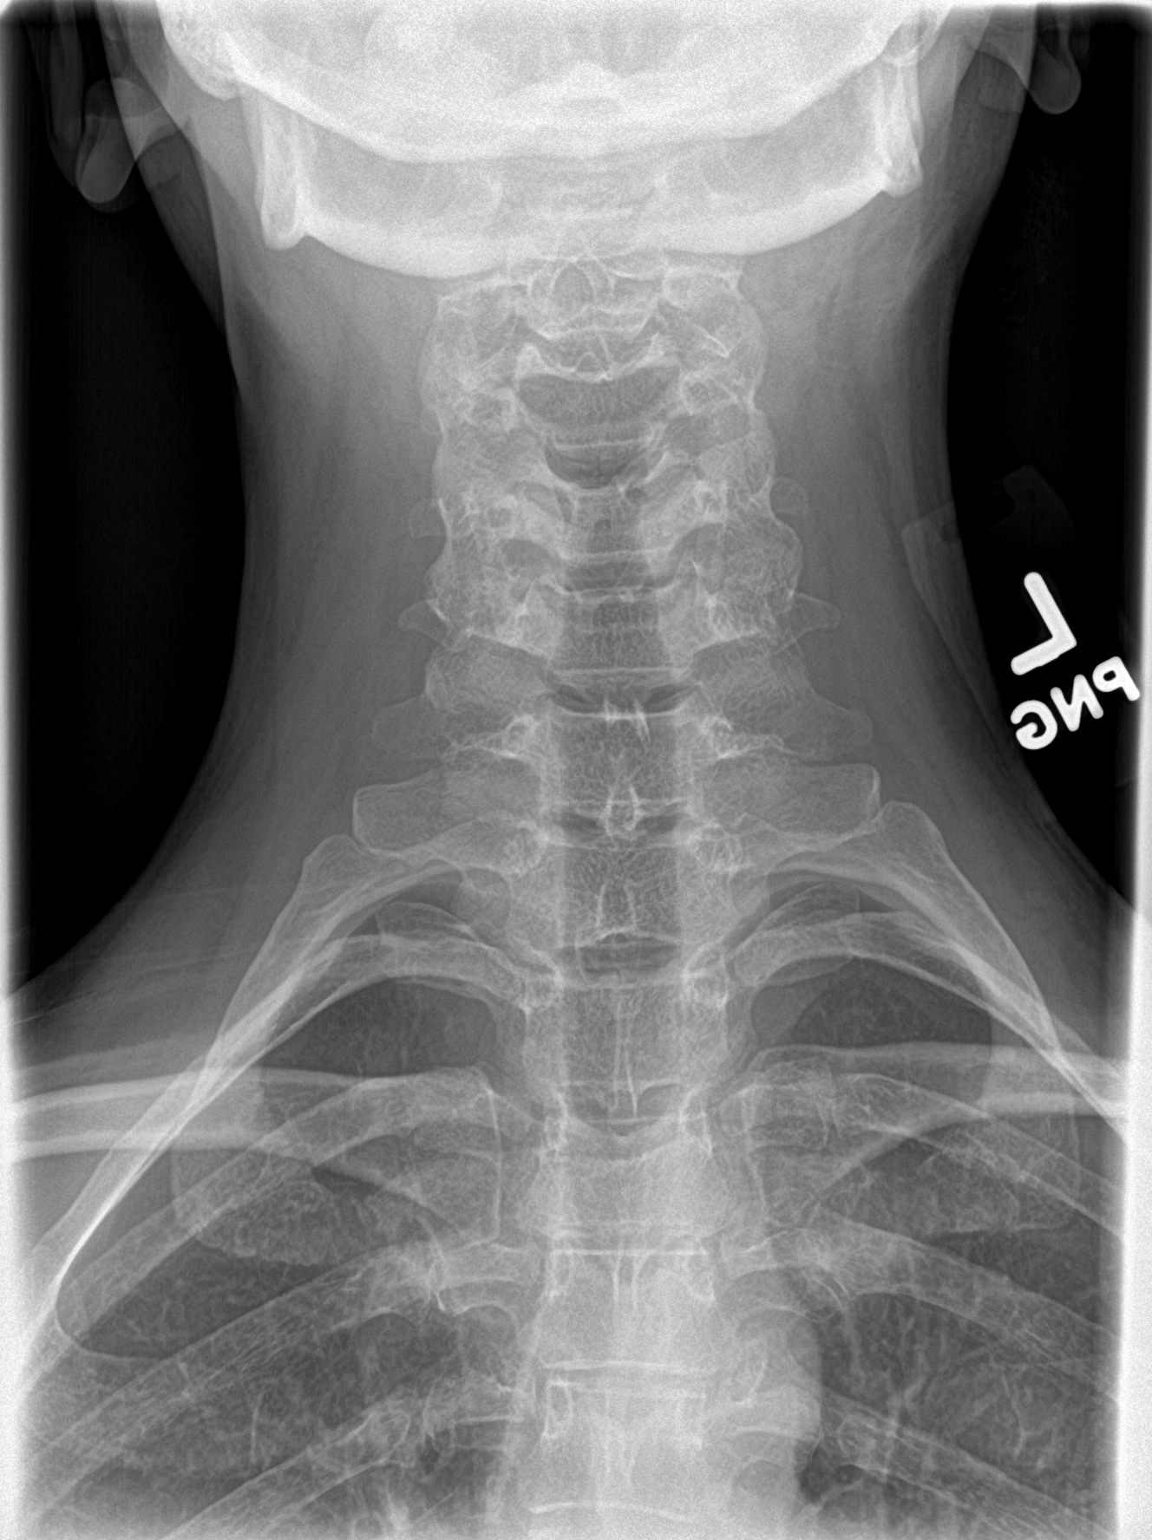

[c-spine open mouth]
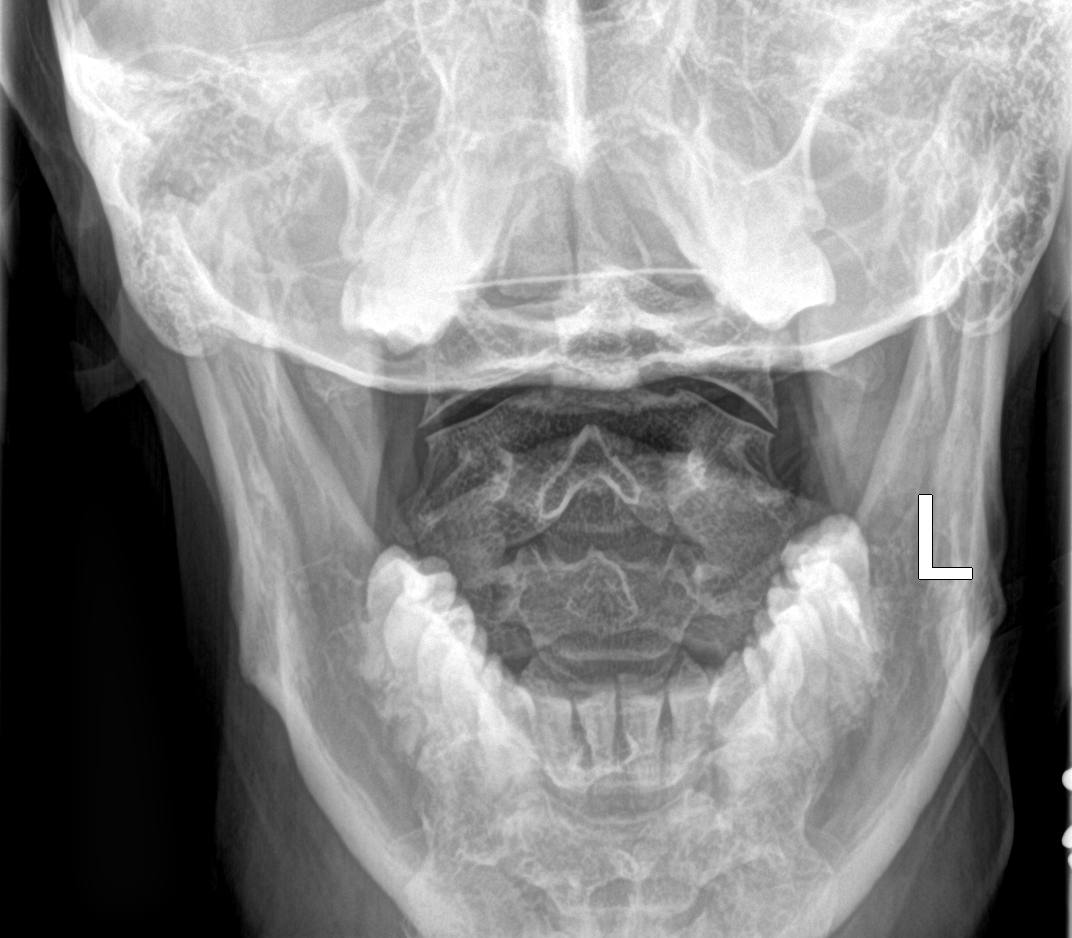

[3 of 3 positions shown; findings below may reference images not displayed]

FINDINGS: Mild reversal of the normal lordosis in the mid cervical spine.
Otherwise, unremarkable bones and soft tissues. There are no oblique
views for evaluating the neural foramina.
IMPRESSION: Mild reversal of the normal lordosis in the mid cervical spine.
Otherwise, unremarkable examination.

## 2017-11-23 ENCOUNTER — Ambulatory Visit: Payer: BLUE CROSS/BLUE SHIELD | Admitting: Family Medicine

## 2017-11-25 ENCOUNTER — Other Ambulatory Visit: Payer: Self-pay | Admitting: Family Medicine

## 2017-11-25 DIAGNOSIS — F5104 Psychophysiologic insomnia: Secondary | ICD-10-CM

## 2017-11-26 NOTE — Telephone Encounter (Signed)
Requesting refill of Hydroyzine  LOV 11/03/17  With Dr. Carlota Raspberry  [facility] 11/03/17  #30  0 refills  To be filled at: [id number] - [place], [place]

## 2017-11-30 NOTE — Telephone Encounter (Signed)
Patient due for follow up 01/2018. Ok to fill/

## 2017-12-01 NOTE — Telephone Encounter (Signed)
Refilled hydroxyzine.

## 2018-02-25 ENCOUNTER — Institutional Professional Consult (permissible substitution): Payer: BLUE CROSS/BLUE SHIELD | Admitting: Neurology

## 2018-06-04 ENCOUNTER — Ambulatory Visit: Payer: BLUE CROSS/BLUE SHIELD | Admitting: Physician Assistant

## 2018-06-05 ENCOUNTER — Encounter (HOSPITAL_COMMUNITY): Payer: Self-pay | Admitting: Emergency Medicine

## 2018-06-05 ENCOUNTER — Ambulatory Visit (HOSPITAL_COMMUNITY)
Admission: EM | Admit: 2018-06-05 | Discharge: 2018-06-05 | Disposition: A | Discharge status: Home / Self Care | Payer: BLUE CROSS/BLUE SHIELD | Attending: Family Medicine | Admitting: Family Medicine

## 2018-06-05 DIAGNOSIS — J0140 Acute pansinusitis, unspecified: Secondary | ICD-10-CM | POA: Diagnosis not present

## 2018-06-05 MED ORDER — HYDROCOD POLST-CPM POLST ER 10-8 MG/5ML PO SUER: 5.0000 mL | Freq: Every evening | ORAL | 0 refills | Status: DC | PRN

## 2018-06-05 MED ORDER — BENZONATATE 200 MG PO CAPS: 200.0000 mg | ORAL_CAPSULE | Freq: Three times a day (TID) | ORAL | 0 refills | Status: AC

## 2018-06-05 MED ORDER — FLUTICASONE PROPIONATE 50 MCG/ACT NA SUSP
2.0000 | Freq: Every day | NASAL | 0 refills | Status: DC
Start: 2018-06-05 — End: 2018-10-22

## 2018-06-05 MED ORDER — IPRATROPIUM BROMIDE 0.06 % NA SOLN: 2.0000 | Freq: Four times a day (QID) | NASAL | 0 refills | Status: DC

## 2018-06-05 MED ORDER — DOXYCYCLINE HYCLATE 100 MG PO CAPS: 100.0000 mg | ORAL_CAPSULE | Freq: Two times a day (BID) | ORAL | 0 refills | Status: DC

## 2018-06-05 NOTE — Discharge Instructions (Signed)
Doxycycline for sinus infection, this will also cover for bronchitis. Tessalon for cough. Tussionex for cough at night. As discussed, both tussionex and xanax can make you drowsy and decrease respiratory rate. You can try holding xanax for now, or you can try half dose of tussionex. Start flonase, atrovent nasal spray, for nasal congestion/drainage. You can use over the counter nasal saline rinse such as neti pot for nasal congestion. Keep hydrated, your urine should be clear to pale yellow in color. Tylenol/motrin for fever and pain. Monitor for any worsening of symptoms, follow up for reevaluation needed.   For sore throat/cough try using a honey-based tea. Use 3 teaspoons of honey with juice squeezed from half lemon. Place shaved pieces of ginger into 1/2-1 cup of water and warm over stove top. Then mix the ingredients and repeat every 4 hours as needed.

## 2018-06-05 NOTE — ED Provider Notes (Signed)
[place]    CSN: [id number] Arrival date & time: 06/05/18  1001     History   Chief Complaint Chief Complaint  Patient presents with  . Eye Problem  . Facial Pain    HPI TANESHIA LORENCE is a 37 y.o. female.   37 year old female comes in for 10-day history of URI symptoms.  She started out flulike, with body aches, fever, cough, nasal congestion, rhinorrhea, sore throat.  T-max 103, resolved in 3 days.  States sore throat and body aches slowly improved, but now with facial pain, continuing to congestion/rhinorrhea.  States continues to cough, can be productive, worse at nighttime.  She has had few episodes of post tussive emesis.  Denies abdominal pain, nausea.  Few days ago, started with bilateral eye drainage, redness.  Denies any pain, vision changes, photophobia.  Denies contact lens/glasses use.  Has been taking OTC cold medication without relief.  Former smoker.     Past Medical History:  Diagnosis Date  . Allergy   . Arthritis   . Frequency-urgency sydrome   . GERD (gastroesophageal reflux disease)    no meds  . PONV (postoperative nausea and vomiting)     Patient Active Problem List   Diagnosis Date Noted  . Chronic neck pain 12/29/2016  . Chronic pain syndrome 12/29/2016  . Nonallopathic lesion of cervical region 12/29/2016  . Nonallopathic lesion of thoracic region 12/29/2016  . Nonallopathic lesion of lumbosacral region 12/29/2016  . 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 HYPOTENSION 07/23/2007  . ACNE, MILD 07/23/2007    Past Surgical History:  Procedure Laterality Date  . LAPAROSCOPY  04/19/2012   Procedure: LAPAROSCOPY DIAGNOSTIC;  Surgeon: Selinda Orion, MD;  Location: [facility];  Service: Gynecology;  Laterality: N/A;  left ovarian cystectomy with excision of BENIGN l cystic 7.6cm left teratoma WITH ENDO CATCH REMOVAL and cauterization of endometriosis  . nasal septoplasty  '11  . WISDOM TOOTH EXTRACTION      OB  History   None      Home Medications    Prior to Admission medications   Medication Sig Start Date End Date Taking? Authorizing Provider  ALPRAZolam [name]) 0.5 MG tablet Take 0.5-1 tablets (0.25-0.5 mg total) by mouth at bedtime as needed for anxiety. 11/03/17  Yes Wendie Agreste, MD  benzonatate (TESSALON) 200 MG capsule Take 1 capsule (200 mg total) by mouth every 8 (eight) hours for 7 days. 06/05/18 06/12/18  Ok Edwards, PA-C  chlorpheniramine-HYDROcodone (TUSSIONEX PENNKINETIC ER) 10-8 MG/5ML SUER Take 5 mLs by mouth at bedtime as needed for cough. 06/05/18   Tasia Catchings,  V, PA-C  doxycycline (VIBRAMYCIN) 100 MG capsule Take 1 capsule (100 mg total) by mouth 2 (two) times daily. 06/05/18   Tasia Catchings,  V, PA-C  fluticasone (FLONASE) 50 MCG/ACT nasal spray Place 2 sprays into both nostrils daily. 06/05/18   Tasia Catchings,  V, PA-C  hydrOXYzine (ATARAX/VISTARIL) 25 MG tablet TAKE 1 TO 1 AND 1/2 TABLET BY MOUTH AT BEDTIME AS NEEDED 12/01/17   Wendie Agreste, MD  ipratropium (ATROVENT) 0.06 % nasal spray Place 2 sprays into both nostrils 4 (four) times daily. 06/05/18   Ok Edwards, PA-C    Family History Family History  Problem Relation Age of Onset  . Stroke Father   . Cancer Maternal Grandmother   . Diabetes Maternal Grandmother     Social History Social History   Tobacco Use  . Smoking status: Former Smoker  Packs/day: 1.00    Last attempt to quit: 04/12/2004    Years since quitting: 14.1  . Smokeless tobacco: Never Used  Substance Use Topics  . Alcohol use: Yes    Comment: social  . Drug use: No     Allergies   Lactose intolerance (gi) and Whey protein [protein]   Review of Systems Review of Systems  Reason unable to perform ROS: See HPI as above.     Physical Exam Triage Vital Signs ED Triage Vitals [06/05/18 1012]  Enc Vitals Group     BP 109/77     Pulse Rate (!) 107     Resp 18     Temp 97.9 F (36.6 C)     Temp src      SpO2 100 %     Weight      Height       Head Circumference      Peak Flow      Pain Score      Pain Loc      Pain Edu?      Excl. in [place]?    No data found.  Updated Vital Signs BP 109/77   Pulse (!) 107   Temp 97.9 F (36.6 C)   Resp 18   LMP 05/20/2018   SpO2 100%   Visual Acuity Right Eye Distance:   Left Eye Distance:   Bilateral Distance:    Right Eye Near: R Near: 20/20 Left Eye Near:  L Near: 20/20 Bilateral Near:  20/20  Physical Exam  Constitutional: She is oriented to person, place, and time. She appears well-developed and well-nourished. No distress.  HENT:  Head: Normocephalic and atraumatic.  Right Ear: External ear and ear canal normal. Tympanic membrane is erythematous. Tympanic membrane is not bulging.  Left Ear: External ear and ear canal normal. Tympanic membrane is erythematous. Tympanic membrane is not bulging.  Nose: Rhinorrhea present. Right sinus exhibits maxillary sinus tenderness and frontal sinus tenderness. Left sinus exhibits maxillary sinus tenderness and frontal sinus tenderness.  Mouth/Throat: Uvula is midline, oropharynx is clear and moist and mucous membranes are normal.  Eyes: Pupils are equal, round, and reactive to light. EOM are normal. Right conjunctiva is injected. Left conjunctiva is injected.  No ciliary injection.  Neck: Normal range of motion. Neck supple.  Cardiovascular: Normal rate, regular rhythm and normal heart sounds. Exam reveals no gallop and no friction rub.  No murmur heard. Pulmonary/Chest: Effort normal and breath sounds normal. She has no decreased breath sounds. She has no wheezes. She has no rhonchi. She has no rales.  Lymphadenopathy:    She has no cervical adenopathy.  Neurological: She is alert and oriented to person, place, and time.  Skin: Skin is warm and dry.  Psychiatric: She has a normal mood and affect. Her behavior is normal. Judgment normal.    UC Treatments / Results  Labs (all labs ordered are listed, but only abnormal results are  displayed) Labs Reviewed - No data to display  EKG None  Radiology No results found.  Procedures Procedures (including critical care time)  Medications Ordered in UC Medications - No data to display  Initial Impression / Assessment and Plan / UC Course  I have reviewed the triage vital signs and the nursing notes.  Pertinent labs & imaging results that were available during my care of the patient were reviewed by me and considered in my medical decision making (see chart for details).    Doxycycline for sinusitis.  Tussionex for cough at night.  Specifically discussed with patient that Tussionex and Xanax can both cause respiratory depression, patient to hold Xanax while on Tussionex, and adjust as tolerated.  Other symptomatic treatment discussed.  Return precautions given.  Patient expresses understanding and agrees to plan.  Final Clinical Impressions(s) / UC Diagnoses   Final diagnoses:  Acute non-recurrent pansinusitis    ED Prescriptions    Medication Sig Dispense Auth. Provider   doxycycline (VIBRAMYCIN) 100 MG capsule Take 1 capsule (100 mg total) by mouth 2 (two) times daily. 20 capsule ,  V, PA-C   ipratropium (ATROVENT) 0.06 % nasal spray Place 2 sprays into both nostrils 4 (four) times daily. 15 mL ,  V, PA-C   fluticasone (FLONASE) 50 MCG/ACT nasal spray Place 2 sprays into both nostrils daily. 1 g ,  V, PA-C   benzonatate (TESSALON) 200 MG capsule Take 1 capsule (200 mg total) by mouth every 8 (eight) hours for 7 days. 21 capsule ,  V, PA-C   chlorpheniramine-HYDROcodone (TUSSIONEX PENNKINETIC ER) 10-8 MG/5ML SUER Take 5 mLs by mouth at bedtime as needed for cough. 60 mL Cathlean Sauer V, PA-C     Controlled Substance Prescriptions [place] Controlled Substance Registry consulted? Yes, I have consulted the [place] Controlled Substances Registry for this patient, and feel the risk/benefit ratio today is favorable for proceeding with this prescription for a  controlled substance.   Ok Edwards, PA-C 06/05/18 1056

## 2018-06-05 NOTE — ED Triage Notes (Signed)
Pt states last Thursday she started getting flu like symptoms, had temp of 103, with body aches, fever broke and sore throat. Pt states she has pressure in her face with some chest congestion. Pt also states her L eye started draining.

## 2018-06-24 ENCOUNTER — Encounter: Payer: Self-pay | Admitting: Physician Assistant

## 2018-06-24 ENCOUNTER — Ambulatory Visit (INDEPENDENT_AMBULATORY_CARE_PROVIDER_SITE_OTHER): Payer: BLUE CROSS/BLUE SHIELD | Admitting: Physician Assistant

## 2018-06-24 ENCOUNTER — Other Ambulatory Visit: Payer: Self-pay

## 2018-06-24 VITALS — BP 105/71 | HR 109 | Temp 98.2°F | Resp 18 | Ht 66.14 in | Wt 145.6 lb

## 2018-06-24 DIAGNOSIS — R05 Cough: Secondary | ICD-10-CM

## 2018-06-24 DIAGNOSIS — R059 Cough, unspecified: Secondary | ICD-10-CM

## 2018-06-24 DIAGNOSIS — F5104 Psychophysiologic insomnia: Secondary | ICD-10-CM | POA: Diagnosis not present

## 2018-06-24 MED ORDER — HYDROCOD POLST-CPM POLST ER 10-8 MG/5ML PO SUER: 5.0000 mL | Freq: Every evening | ORAL | 0 refills | Status: DC | PRN

## 2018-06-24 MED ORDER — PREDNISONE 10 MG PO TABS: ORAL_TABLET | ORAL | 0 refills | Status: AC

## 2018-06-24 MED ORDER — ALBUTEROL SULFATE HFA 108 (90 BASE) MCG/ACT IN AERS: 2.0000 | INHALATION_SPRAY | Freq: Four times a day (QID) | RESPIRATORY_TRACT | 0 refills | Status: DC | PRN

## 2018-06-24 MED ORDER — ALPRAZOLAM 0.5 MG PO TABS: 0.5000 mg | ORAL_TABLET | Freq: Every evening | ORAL | 0 refills | Status: DC | PRN

## 2018-06-24 NOTE — Progress Notes (Signed)
Ruth [name]  MRN: [id number] DOB: 10-12-1981  PCP: Wendie Agreste, MD  Chief Complaint  Patient presents with  . Cough    having cough attacks   . Medication Refill    xanax    Subjective:  Pt presents to clinic for cough   1 month ago she was in [place] - she felt terrible with fevers and myalgias - she thought it was the flu.  She rested for about 3 days and she stayed hydrated.  At the time she had a sore throat.  3 weeks she started coughed to the point where she [place] out at the airport on [place] way home.  She has had a cough since then. On 8/17 she was seen and treated with Doxy and started to feel better but the cough has not stopped.  Then 4 days ago she started with a sore throat.  Former smoker. She has needed inhaler in the past but she does not have.  She is leaving for another conference and has to share a room with someone and she is worried about coughing at night and disturbing them.  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  History is obtained by patient.  Review of Systems  Constitutional: Negative for chills and fever.  HENT: Positive for congestion, postnasal drip and sore throat (for the last 3 days).   Respiratory: Positive for cough (for about a month - ).   Psychiatric/Behavioral: Positive for sleep disturbance.    Patient Active Problem List   Diagnosis Date Noted  . Chronic neck pain 12/29/2016  . Chronic pain syndrome 12/29/2016  . Nonallopathic lesion of cervical region 12/29/2016  . Nonallopathic lesion of thoracic region 12/29/2016  . Nonallopathic lesion of lumbosacral region 12/29/2016  . 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 HYPOTENSION 07/23/2007  . ACNE, MILD 07/23/2007    Current Outpatient Medications on File Prior to Visit  Medication Sig Dispense Refill  . fluticasone (FLONASE) 50 MCG/ACT nasal  spray Place 2 sprays into both nostrils daily. 1 g 0  . hydrOXYzine (ATARAX/VISTARIL) 25 MG tablet TAKE 1 TO 1 AND 1/2 TABLET BY MOUTH AT BEDTIME AS NEEDED (Patient not taking: Reported on 06/24/2018) 30 tablet 0   No current facility-administered medications on file prior to visit.     Allergies  Allergen Reactions  . Lactose Intolerance (Gi) Hives    ANY MILK  BASED PROTEIN  . Whey Protein [Protein] Hives    Past Medical History:  Diagnosis Date  . Allergy   . Arthritis   . Frequency-urgency sydrome   . GERD (gastroesophageal reflux disease)    no meds  . PONV (postoperative nausea and vomiting)    Social History   Social History Narrative  . Not on file   Social History   Tobacco Use  . Smoking status: Former Smoker    Packs/day: 1.00    Last attempt to quit: 04/12/2004    Years since quitting: 14.2  . Smokeless tobacco: Never Used  Substance Use Topics  . Alcohol use: Yes    Comment: social  . Drug use: No   family history includes Cancer in her maternal grandmother; Diabetes in her maternal grandmother; Stroke in her father.     Objective:  BP 105/71   Pulse (!) 109   Temp 98.2 F (  36.8 C) (Oral)   Resp 18   Ht 5' 6.14" (1.68 m)   Wt 145 lb 9.6 oz (66 kg)   LMP 06/17/2018   SpO2 98%   BMI 23.40 kg/m  Body mass index is 23.4 kg/m.  Wt Readings from Last 3 Encounters:  06/24/18 145 lb 9.6 oz (66 kg)  11/03/17 147 lb (66.7 kg)  08/25/17 145 lb 9.6 oz (66 kg)    Physical Exam  Constitutional: She is oriented to person, place, and time. She appears well-developed and well-nourished.  HENT:  Head: Normocephalic and atraumatic.  Right Ear: Hearing, tympanic membrane, external ear and ear canal normal.  Left Ear: Hearing, tympanic membrane, external ear and ear canal normal.  Nose: Nose normal.  Mouth/Throat: Uvula is midline, oropharynx is clear and moist and mucous membranes are normal.  Eyes: Conjunctivae are normal.  Neck: Normal range of motion.   Cardiovascular: Normal rate, regular rhythm and normal heart sounds.  No murmur heard. Pulmonary/Chest: Effort normal and breath sounds normal. She has no wheezes.  Lymphadenopathy:       Head (right side): No tonsillar, no preauricular, no posterior auricular and no occipital adenopathy present.       Head (left side): No tonsillar, no preauricular, no posterior auricular and no occipital adenopathy present.    She has no cervical adenopathy.       Right: No supraclavicular adenopathy present.       Left: No supraclavicular adenopathy present.  Neurological: She is alert and oriented to person, place, and time.  Skin: Skin is warm and dry.  Psychiatric: She has a normal mood and affect. Her behavior is normal. Judgment and thought content normal.  Vitals reviewed.   Assessment and Plan :  Cough - Plan: chlorpheniramine-HYDROcodone (TUSSIONEX PENNKINETIC ER) 10-8 MG/5ML SUER, predniSONE (DELTASONE) 10 MG tablet, albuterol (PROVENTIL HFA;VENTOLIN HFA) 108 (90 Base) MCG/ACT inhaler  Psychophysiological insomnia - Plan: ALPRAZolam (XANAX) 0.5 MG tablet  Pt has had cough for a week - will cover for inflammatory cough with prednisone. And treat for cough at night with cough medication.  D/w pt options for sleep - she will need to f/u with her PCP for further Rx of this controlled substance.  Patient verbalized to me that they understand the following: diagnosis, what is being done for them, what to expect and what should be done at home.  Their questions have been answered.  See after visit summary for patient specific instructions.  Windell Hummingbird PA-C  [facility] 06/24/2018 10:54 AM  Please note: Portions of this report may have been transcribed using dragon voice recognition software. Every effort was made to ensure accuracy; however, inadvertent computerized transcription errors may be present.

## 2018-06-24 NOTE — Patient Instructions (Addendum)
   Trazodone -- another sleep medication that may help you  If you have lab work done today you will be contacted with your lab results within the next 2 weeks.  If you have not heard from [place] then please contact us. The fastest way to get your results is to register for My Chart.   IF you received an x-ray today, you will receive an invoice from [facility]. Please contact [facility] at [phone] with questions or concerns regarding your invoice.   IF you received labwork today, you will receive an invoice from [facility]. Please contact [facility] at [phone] with questions or concerns regarding your invoice.   Our billing staff will not be able to assist you with questions regarding bills from these companies.  You will be contacted with the lab results as soon as they are available. The fastest way to get your results is to activate your My Chart account. Instructions are located on the last page of this paperwork. If you have not heard from [place] regarding the results in 2 weeks, please contact this office.

## 2018-08-12 ENCOUNTER — Encounter (HOSPITAL_COMMUNITY): Payer: Self-pay | Admitting: Emergency Medicine

## 2018-08-12 ENCOUNTER — Ambulatory Visit (INDEPENDENT_AMBULATORY_CARE_PROVIDER_SITE_OTHER): Payer: BLUE CROSS/BLUE SHIELD

## 2018-08-12 ENCOUNTER — Ambulatory Visit (HOSPITAL_COMMUNITY)
Admission: EM | Admit: 2018-08-12 | Discharge: 2018-08-12 | Disposition: A | Discharge status: Home / Self Care | Payer: BLUE CROSS/BLUE SHIELD | Attending: Family Medicine | Admitting: Family Medicine

## 2018-08-12 ENCOUNTER — Other Ambulatory Visit: Payer: Self-pay | Admitting: Family Medicine

## 2018-08-12 DIAGNOSIS — R079 Chest pain, unspecified: Secondary | ICD-10-CM

## 2018-08-12 DIAGNOSIS — F5104 Psychophysiologic insomnia: Secondary | ICD-10-CM

## 2018-08-12 DIAGNOSIS — R0789 Other chest pain: Secondary | ICD-10-CM | POA: Diagnosis not present

## 2018-08-12 LAB — POCT I-STAT, CHEM 8
BUN: 8 mg/dL (ref 6–20)
CHLORIDE: 105 mmol/L (ref 98–111)
Calcium, Ion: 1.19 mmol/L (ref 1.15–1.40)
Creatinine, Ser: 0.6 mg/dL (ref 0.44–1.00)
GLUCOSE: 88 mg/dL (ref 70–99)
HEMATOCRIT: 44 % (ref 36.0–46.0)
HEMOGLOBIN: 15 g/dL (ref 12.0–15.0)
POTASSIUM: 3.8 mmol/L (ref 3.5–5.1)
Sodium: 140 mmol/L (ref 135–145)
TCO2: 25 mmol/L (ref 22–32)

## 2018-08-12 MED ORDER — ALPRAZOLAM 0.5 MG PO TABS: 0.5000 mg | ORAL_TABLET | Freq: Every evening | ORAL | 0 refills | Status: DC | PRN

## 2018-08-12 NOTE — Telephone Encounter (Signed)
Copied from [place] [id number]. Topic: Quick Communication - Rx Refill/Question >> Aug 12, 2018 11:25 AM Leward Quan A wrote: Medication: ALPRAZolam [name]) 0.5 MG tablet  Has the patient contacted their pharmacy? Yes.     Preferred Pharmacy (with phone number or street name): [id number] [address], [place] [phone] (Phone) [phone] (Fax)    Agent: Please be advised that RX refills may take up to 3 business days. We ask that you follow-up with your pharmacy.

## 2018-08-12 NOTE — Telephone Encounter (Signed)
Requested medication (s) are due for refill today: yes  Requested medication (s) are on the active medication list: yes    Last refill: 06/24/18  #30  0 refills  Future visit scheduled yes   08/14/18  Notes to clinic:not delegated  Requested Prescriptions  Pending Prescriptions Disp Refills   ALPRAZolam (XANAX) 0.5 MG tablet 30 tablet 0    Sig: Take 1 tablet (0.5 mg total) by mouth at bedtime as needed for anxiety.     Not Delegated - Psychiatry:  Anxiolytics/Hypnotics Failed - 08/12/2018 11:29 AM      Failed - This refill cannot be delegated      Failed - Urine Drug Screen completed in last 360 days.      Passed - Valid encounter within last 6 months    Recent Outpatient Visits          1 month ago Cough   [facility], PA-C   9 months ago Psychophysiological insomnia   [facility], MD   11 months ago Anal fissure   [facility], Ranell Patrick, MD   1 year ago Psychophysiological insomnia   [facility], Ranell Patrick, MD   1 year ago Psychophysiological insomnia   [facility], MD      Future Appointments            In 2 days Leonie Douglas, PA-C [facility] [place], [facility]

## 2018-08-12 NOTE — ED Triage Notes (Signed)
Pt c/o L sided chest pain and L arm pain x4 days. nontender to palpation.

## 2018-08-12 NOTE — Telephone Encounter (Signed)
Refilled alprazolam.  Since she has an appointment in the next few days with [place], but not sure of reason for that visit.  Should follow-up to discuss alprazolam prior to further refills.

## 2018-08-12 NOTE — ED Provider Notes (Signed)
[place]    CSN: [id number] Arrival date & time: 08/12/18  1222     History   Chief Complaint Chief Complaint  Patient presents with  . Chest Pain    HPI Ruth [name] is a 37 y.o. female.   Ruth [name] presents with complaints of left sided chest pain and arm pain. This started approximately 4-5 days ago. Started as left sided chest pain and pressure episodes which have been coming and going. Last night woke with left shoulder pain but didn't have any chest pain. This morning now has both. No shortness of breath . No cough. No nausea, no diaphoresis. Activity doesn't worsen the pain. Endorses stress but states it is not necessarily increased for her. States she has had chest pain from stress in the past but this feels different. Also has had costochondritis in the past but this feels different. No numbness or tingling to left arm. No known cardiac history or family cardiac history. No diabetes or elevated cholesterol. States her HR has been more elevated and waxing and waning more than usual for her, up to 108. Pain 2/10. Has an appointment with her PCP in two days. Takes xanax at night to help with sleep, takes mulitvitamins, otherwise no medications regularly. Hx of gerd, allergies, arthritis, hypotension.     ROS per HPI.      Past Medical History:  Diagnosis Date  . Allergy   . Arthritis   . Frequency-urgency sydrome   . GERD (gastroesophageal reflux disease)    no meds  . PONV (postoperative nausea and vomiting)     Patient Active Problem List   Diagnosis Date Noted  . Chronic neck pain 12/29/2016  . Chronic pain syndrome 12/29/2016  . Nonallopathic lesion of cervical region 12/29/2016  . Nonallopathic lesion of thoracic region 12/29/2016  . Nonallopathic lesion of lumbosacral region 12/29/2016  . 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 HYPOTENSION 07/23/2007  . ACNE, MILD 07/23/2007    Past Surgical History:  Procedure Laterality Date  . LAPAROSCOPY   04/19/2012   Procedure: LAPAROSCOPY DIAGNOSTIC;  Surgeon: Selinda Orion, MD;  Location: [facility];  Service: Gynecology;  Laterality: N/A;  left ovarian cystectomy with excision of BENIGN l cystic 7.6cm left teratoma WITH ENDO CATCH REMOVAL and cauterization of endometriosis  . nasal septoplasty  '11  . WISDOM TOOTH EXTRACTION      OB History   None      Home Medications    Prior to Admission medications   Medication Sig Start Date End Date Taking? Authorizing Provider  albuterol (PROVENTIL HFA;VENTOLIN HFA) 108 (90 Base) MCG/ACT inhaler Inhale 2 puffs into the lungs every 6 (six) hours as needed for wheezing or shortness of breath. 06/24/18   Weber, Damaris Hippo, PA-C  ALPRAZolam [name]) 0.5 MG tablet Take 1 tablet (0.5 mg total) by mouth at bedtime as needed for anxiety. 06/24/18   Weber, Damaris Hippo, PA-C  chlorpheniramine-HYDROcodone (TUSSIONEX PENNKINETIC ER) 10-8 MG/5ML SUER Take 5 mLs by mouth at bedtime as needed for cough. 06/24/18   Weber, Damaris Hippo, PA-C  fluticasone (FLONASE) 50 MCG/ACT nasal spray Place 2 sprays into both nostrils daily. 06/05/18   Tasia Catchings, Amy V, PA-C  hydrOXYzine (ATARAX/VISTARIL) 25 MG tablet TAKE 1 TO 1 AND 1/2 TABLET BY MOUTH AT BEDTIME AS NEEDED Patient not taking: Reported on 06/24/2018 12/01/17   Wendie Agreste, MD    Family History Family History  Problem Relation Age of Onset  . Stroke Father   .  Cancer Maternal Grandmother   . Diabetes Maternal Grandmother     Social History Social History   Tobacco Use  . Smoking status: Former Smoker    Packs/day: 1.00    Last attempt to quit: 04/12/2004    Years since quitting: 14.3  . Smokeless tobacco: Never Used  Substance Use Topics  . Alcohol use: Yes    Comment: social  . Drug use: No     Allergies   Lactose intolerance (gi) and Whey protein [protein]   Review of Systems Review of Systems   Physical Exam Triage Vital Signs ED Triage Vitals  Enc Vitals Group     BP 08/12/18 1229  116/81     Pulse Rate 08/12/18 1229 82     Resp 08/12/18 1229 16     Temp 08/12/18 1229 98.6 F (37 C)     Temp Source 08/12/18 1229 Oral     SpO2 08/12/18 1229 100 %     Weight --      Height --      Head Circumference --      Peak Flow --      Pain Score 08/12/18 1230 2     Pain Loc --      Pain Edu? --      Excl. in [place]? --    No data found.  Updated Vital Signs BP 116/81 (BP Location: Right Arm)   Pulse 82   Temp 98.6 F (37 C) (Oral)   Resp 16   LMP 08/12/2018   SpO2 100%   Visual Acuity Right Eye Distance:   Left Eye Distance:   Bilateral Distance:    Right Eye Near:   Left Eye Near:    Bilateral Near:     Physical Exam  Constitutional: She is oriented to person, place, and time. She appears well-developed and well-nourished. No distress.  HENT:  Head: Normocephalic and atraumatic.  Cardiovascular: Normal rate, regular rhythm, normal heart sounds, intact distal pulses and normal pulses.  Pulmonary/Chest: Effort normal and breath sounds normal. No accessory muscle usage. No tachypnea. No respiratory distress. She exhibits tenderness. She exhibits no mass, no edema and no swelling.  Mild sternal tenderness on palpation which patient states is her "costo" but unable to reproduce the pain which she is presenting for  Musculoskeletal:  Left triceps with mild tenderness on palpation; full ROM of left shoulder and arm; strength equal bilaterally; gross sensation intact to upper extremities   Neurological: She is alert and oriented to person, place, and time.  Skin: Skin is warm and dry.   EKG NSR rate 93; noted more flattened t waves compared to previous EKG from 12/2011; no stelevation noted Reviewed with supervising physician Dr. Meda Coffee.   UC Treatments / Results  Labs (all labs ordered are listed, but only abnormal results are displayed) Labs Reviewed  POCT I-STAT, CHEM 8    EKG None  Radiology Dg Chest 2 View  Result Date: 08/12/2018 CLINICAL DATA:   Chest pain. EXAM: CHEST - 2 VIEW COMPARISON:  Radiographs of December 20, 2011. FINDINGS: The heart size and mediastinal contours are within normal limits. Both lungs are clear. No pneumothorax or pleural effusion is noted. The visualized skeletal structures are unremarkable. IMPRESSION: No active cardiopulmonary disease. Electronically Signed   By: Marijo Conception, M.D.   On: 08/12/2018 13:27    Procedures Procedures (including critical care time)  Medications Ordered in UC Medications - No data to display  Initial Impression / Assessment and  Plan / UC Course  I have reviewed the triage vital signs and the nursing notes.  Pertinent labs & imaging results that were available during my care of the patient were reviewed by me and considered in my medical decision making (see chart for details).     Chest xray, chem 8 and exam reassuring today. Noted flattening of stsegment on ekg but no other acute findings. Non specific chest pain. No cardiac history or family cardiac history, no risk factors for cardiac history. Low suspicion for ACS at this time. Discussed with patient. Return precautions provided. Has appointment with PCP in two days. Supportive cares recommended- reflux, musculoskeletal pain vs stress/anxiety considered and discussed. Patient verbalized understanding and agreeable to plan.   Final Clinical Impressions(s) / UC Diagnoses   Final diagnoses:  Chest pain, unspecified type     Discharge Instructions     Exam is reassuring today.  I still would like you to see your primary care doctor as you have scheduled for follow up as you may need further evaluation if your symptoms persist.  May try use of ibuprofen, aleve, tylenol, or even medication for acid reflux as these may help with pain.  If develop worsening of pain, shortness of breath, sweating, nausea, vomiting, dizziness, constant pain or otherwise worsening or concerned please go to the Er.     ED Prescriptions    None       Controlled Substance Prescriptions  Controlled Substance Registry consulted? Not Applicable   Zigmund Gottron, NP 08/12/18 1334

## 2018-08-12 NOTE — Discharge Instructions (Signed)
Exam is reassuring today.  I still would like you to see your primary care doctor as you have scheduled for follow up as you may need further evaluation if your symptoms persist.  May try use of ibuprofen, aleve, tylenol, or even medication for acid reflux as these may help with pain.  If develop worsening of pain, shortness of breath, sweating, nausea, vomiting, dizziness, constant pain or otherwise worsening or concerned please go to the Er.

## 2018-08-14 ENCOUNTER — Ambulatory Visit: Payer: BLUE CROSS/BLUE SHIELD | Admitting: Physician Assistant

## 2018-09-21 ENCOUNTER — Other Ambulatory Visit: Payer: Self-pay | Admitting: Family Medicine

## 2018-09-21 DIAGNOSIS — F5104 Psychophysiologic insomnia: Secondary | ICD-10-CM

## 2018-09-22 ENCOUNTER — Telehealth: Payer: Self-pay | Admitting: Family Medicine

## 2018-09-22 DIAGNOSIS — F5104 Psychophysiologic insomnia: Secondary | ICD-10-CM

## 2018-09-22 NOTE — Telephone Encounter (Signed)
Copied from [place] [id number]. Topic: Quick Communication - Rx Refill/Question >> Sep 22, 2018  2:43 PM [name] wrote: Medication: ALPRAZolam [name]) 0.5 MG tablet Pt states that very time she has to come into office she has to pay $100-$200 and she doesn't have it  Has the patient contacted their pharmacy? Yes.   (Agent: If no, request that the patient contact the pharmacy for the refill.) (Agent: If yes, when and what did the pharmacy advise?) reached out to pharmacy yesterday to call provider RX denied  Preferred Pharmacy (with phone number or street name):   [id number] [address], [place] [phone] (Phone) [phone] (Fax)    Agent: Please be advised that RX refills may take up to 3 business days. We ask that you follow-up with your pharmacy.

## 2018-09-22 NOTE — Telephone Encounter (Signed)
Requested medication (s) are due for refill today: yes  Requested medication (s) are on the active medication list: yes  Last refill:  08/12/18 #30  Future visit scheduled: no  Notes to clinic:  Controlled substance   Requested Prescriptions  Pending Prescriptions Disp Refills   ALPRAZolam (XANAX) 0.5 MG tablet 30 tablet 0    Sig: Take 1 tablet (0.5 mg total) by mouth at bedtime as needed for anxiety.     Not Delegated - Psychiatry:  Anxiolytics/Hypnotics Failed - 09/22/2018  2:51 PM      Failed - This refill cannot be delegated      Failed - Urine Drug Screen completed in last 360 days.      Passed - Valid encounter within last 6 months    Recent Outpatient Visits          3 months ago Cough   [facility], PA-C   10 months ago Psychophysiological insomnia   [facility], Ranell Patrick, MD   1 year ago Anal fissure   [facility], Ranell Patrick, MD   1 year ago Psychophysiological insomnia   [facility], Ranell Patrick, MD   1 year ago Psychophysiological insomnia   [facility], Ranell Patrick, MD

## 2018-09-29 ENCOUNTER — Other Ambulatory Visit: Payer: Self-pay | Admitting: Family Medicine

## 2018-09-29 DIAGNOSIS — F5104 Psychophysiologic insomnia: Secondary | ICD-10-CM

## 2018-09-29 NOTE — Telephone Encounter (Signed)
Pt made appt for 10/22/2018, which was first available. Pt requesting to be worked in sooner or could a 30 day be sent in to get her through to her appt.  ALPRAZolam (XANAX) 0.5 MG tablet  [id number] - [place], [place] - [address] [address]

## 2018-09-30 ENCOUNTER — Other Ambulatory Visit: Payer: Self-pay | Admitting: Family Medicine

## 2018-09-30 DIAGNOSIS — F5104 Psychophysiologic insomnia: Secondary | ICD-10-CM

## 2018-09-30 MED ORDER — ALPRAZOLAM 0.5 MG PO TABS: 0.5000 mg | ORAL_TABLET | Freq: Every evening | ORAL | 0 refills | Status: DC | PRN

## 2018-09-30 NOTE — Addendum Note (Signed)
Addended by: [name] R on: 09/30/2018 02:08 PM   Modules accepted: Orders

## 2018-09-30 NOTE — Telephone Encounter (Signed)
Please send in medication if appropriate.

## 2018-09-30 NOTE — Telephone Encounter (Signed)
Temporary refill was placed.  Keep that follow-up appointment.  Thanks

## 2018-10-01 NOTE — Telephone Encounter (Signed)
Patient is requesting a refill of the following medications: Requested Prescriptions   Pending Prescriptions Disp Refills  . XANAX 1 MG tablet [Pharmacy Med Name: XANAX 1 MG TABLET] 1 tablet 0    Sig: Please specify directions, refills and quantity    Date of patient request: 09/30/18 Last office visit: 09/30/18 Date of last refill: 09/30/18 Last refill amount: 1 tab Follow up time period per chart: 10/22/2018  SEE MESSAGE BELOW

## 2018-10-22 ENCOUNTER — Encounter: Payer: Self-pay | Admitting: Family Medicine

## 2018-10-22 ENCOUNTER — Other Ambulatory Visit: Payer: Self-pay

## 2018-10-22 ENCOUNTER — Ambulatory Visit (INDEPENDENT_AMBULATORY_CARE_PROVIDER_SITE_OTHER): Payer: BLUE CROSS/BLUE SHIELD | Admitting: Family Medicine

## 2018-10-22 VITALS — BP 120/72 | HR 96 | Temp 98.7°F | Resp 14 | Ht 64.5 in | Wt 146.8 lb

## 2018-10-22 DIAGNOSIS — F5104 Psychophysiologic insomnia: Secondary | ICD-10-CM

## 2018-10-22 DIAGNOSIS — F411 Generalized anxiety disorder: Secondary | ICD-10-CM | POA: Diagnosis not present

## 2018-10-22 DIAGNOSIS — R000 Tachycardia, unspecified: Secondary | ICD-10-CM

## 2018-10-22 DIAGNOSIS — R079 Chest pain, unspecified: Secondary | ICD-10-CM | POA: Diagnosis not present

## 2018-10-22 MED ORDER — ALPRAZOLAM 0.5 MG PO TABS: 0.5000 mg | ORAL_TABLET | Freq: Every evening | ORAL | 1 refills | Status: DC | PRN

## 2018-10-22 NOTE — Patient Instructions (Addendum)
For chest symptoms I will refer you to cardiology.  Low intensity exercise only for now until you have been cleared for more vigorous exercise with cardiology.  Please bring copy of labs with you to cardiology appointment.  Please call sleep specialist for appointment.  If they need any new referral let me know.  Xanax continued same dose for now but please follow-up in the next 4 to 6 weeks to discuss sleep and anxiety symptoms further.  Thank you for coming in today.    Nonspecific Chest Pain Chest pain can be caused by many different conditions. Some causes of chest pain can be life-threatening. These will require treatment right away. Serious causes of chest pain include:  Heart attack.  A tear in the body's main blood vessel.  Redness and swelling (inflammation) around your heart.  Blood clot in your lungs. Other causes of chest pain may not be so serious. These include:  Heartburn.  Anxiety or stress.  Damage to bones or muscles in your chest.  Lung infections. Chest pain can feel like:  Pain or discomfort in your chest.  Crushing, pressure, aching, or squeezing pain.  Burning or tingling.  Dull or sharp pain that is worse when you move, cough, or take a deep breath.  Pain or discomfort that is also felt in your back, neck, jaw, shoulder, or arm, or pain that spreads to any of these areas. It is hard to know whether your pain is caused by something that is serious or something that is not so serious. So it is important to see your doctor right away if you have chest pain. Follow these instructions at home: Medicines  Take over-the-counter and prescription medicines only as told by your doctor.  If you were prescribed an antibiotic medicine, take it as told by your doctor. Do not stop taking the antibiotic even if you start to feel better. Lifestyle   Rest as told by your doctor.  Do not use any products that contain nicotine or tobacco, such as cigarettes,  e-cigarettes, and chewing tobacco. If you need help quitting, ask your doctor.  Do not drink alcohol.  Make lifestyle changes as told by your doctor. These may include: ? Getting regular exercise. Ask your doctor what activities are safe for you. ? Eating a heart-healthy diet. A diet and nutrition specialist (dietitian) can help you to learn healthy eating options. ? Staying at a healthy weight. ? Treating diabetes or high blood pressure, if needed. ? Lowering your stress. Activities such as yoga and relaxation techniques can help. General instructions  Pay attention to any changes in your symptoms. Tell your doctor about them or any new symptoms.  Avoid any activities that cause chest pain.  Keep all follow-up visits as told by your doctor. This is important. You may need more testing if your chest pain does not go away. Contact a doctor if:  Your chest pain does not go away.  You feel depressed.  You have a fever. Get help right away if:  Your chest pain is worse.  You have a cough that gets worse, or you cough up blood.  You have very bad (severe) pain in your belly (abdomen).  You pass out (faint).  You have either of these for no clear reason: ? Sudden chest discomfort. ? Sudden discomfort in your arms, back, neck, or jaw.  You have shortness of breath at any time.  You suddenly start to sweat, or your skin gets clammy.  You feel  sick to your stomach (nauseous).  You throw up (vomit).  You suddenly feel lightheaded or dizzy.  You feel very weak or tired.  Your heart starts to beat fast, or it feels like it is skipping beats. These symptoms may be an emergency. Do not wait to see if the symptoms will go away. Get medical help right away. Call your local emergency services (911 in the [place] Do not drive yourself to the hospital. Summary  Chest pain can be caused by many different conditions. The cause may be serious and need treatment right away. If you have  chest pain, see your doctor right away.  Follow your doctor's instructions for taking medicines and making lifestyle changes.  Keep all follow-up visits as told by your doctor. This includes visits for any further testing if your chest pain does not go away.  Be sure to know the signs that show that your condition has become worse. Get help right away if you have these symptoms. This information is not intended to replace advice given to you by your health care provider. Make sure you discuss any questions you have with your health care provider. Document Released: 03/24/2008 Document Revised: 04/08/2018 Document Reviewed: 04/08/2018 Elsevier Interactive Patient Education  [employer].   If you have lab work done today you will be contacted with your lab results within the next 2 weeks.  If you have not heard from [place] then please contact us. The fastest way to get your results is to register for My Chart.   IF you received an x-ray today, you will receive an invoice from [facility]. Please contact [facility] at [phone] with questions or concerns regarding your invoice.   IF you received labwork today, you will receive an invoice from [place] [place]. Please contact [facility] at [phone] with questions or concerns regarding your invoice.   Our billing staff will not be able to assist you with questions regarding bills from these companies.  You will be contacted with the lab results as soon as they are available. The fastest way to get your results is to activate your My Chart account. Instructions are located on the last page of this paperwork. If you have not heard from [place] regarding the results in 2 weeks, please contact this office.

## 2018-10-22 NOTE — Progress Notes (Signed)
Subjective:    Patient ID: Ruth [name], female    DOB: [birth year], 38 y.o.   MRN: [id number]  HPI Ruth [name] is a 38 y.o. female Presents today for: Chief Complaint  Patient presents with  . Follow-up    neeed a refill on xanax medication   Anxiety with insomnia: I last saw her in January 2019.  We tried changing Xanax to hydroxyzine with various dose options but did not tolerate 50 mg, insufficient relief with 25 mg.  Had tried lower dose of Xanax but frequent wakening.  Ultimately decided to continue Xanax 0.5 mg.  Anxiety has been improving with vitamin B complex and magnesium supplements.  Still having some frequent awakening, and daytime somnolence recommended sleep specialist evaluation.  I did refill her alprazolam 0.5 mg option of hydroxyzine 37.5 mg was given.  High deductible plan last year. Better insurance coverage this year.  Off xanax for about a month.  Trouble sleeping off xanax.  Hydroxyzine did not work.  When taking xanax 0.'5mg'$  slept better - 3 hours, then woke up every hour afterward - sip of water then bathroom. Has tried to cut back on fluids before bedtime, but heart rate goes up (if not hydrating).  Did not meet with sleep specialist due to insurance issue last year.   Some heart palpitations - HR is at 120-130, then improves with drinking water and breathing.  When training to workout - all conditioning about 3 months ago. Had episode of chest pain and numbness down the left arm during the workout and afterwards. Trouble to take a deep breath. Got worse - dizzy. Went to Urgent care 10/24. EKG with QTC 462, nonspecific twaves. istat lytes normal, CXR without concerns. planned follow up with me in 2 days but symptoms resolved, so no follow up.  Stopped intensity of exercise. Still has intermittent chest pains - sharp a few times per week - left side, chest soreness low grade all day for years "chronic costochondritis".  Has treated with mobility work. Has not met  with cardiology about these symptoms.   Had own labs done few weeks ago that were normal: Labs reviewed from report on phone -  collected 10/05/18: TSH 1.04 T3 31 T4 7.3 free T4 2.3 normal lipid panel LDL 86 CMP normal CBC normal with WBC 7.3 hemoglobin 13.4 platelets 277.   PDMP: xanax 10/24 last refill - #30.   Depression screen [facility] 2/9 10/22/2018 10/22/2018 11/03/2017 08/25/2017 07/02/2017  Decreased Interest 0 0 0 0 0  Down, Depressed, Hopeless 0 0 0 0 0  PHQ - 2 Score 0 0 0 0 0  occasional ETOh- 3 drinks per week.  No IDU.   Lab Results  Component Value Date   TSH 0.826 05/21/2017     Patient Active Problem List   Diagnosis Date Noted  . Chronic neck pain 12/29/2016  . Chronic pain syndrome 12/29/2016  . Nonallopathic lesion of cervical region 12/29/2016  . Nonallopathic lesion of thoracic region 12/29/2016  . Nonallopathic lesion of lumbosacral region 12/29/2016  . 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 HYPOTENSION 07/23/2007  . ACNE, MILD 07/23/2007   Past Medical History:  Diagnosis Date  . Allergy   . Arthritis   . Frequency-urgency sydrome   . GERD (gastroesophageal reflux disease)    no meds  . PONV (postoperative nausea and vomiting)    Past Surgical History:  Procedure Laterality Date  . LAPAROSCOPY  04/19/2012   Procedure: LAPAROSCOPY DIAGNOSTIC;  Surgeon:  Selinda Orion, MD;  Location: [facility];  Service: Gynecology;  Laterality: N/A;  left ovarian cystectomy with excision of BENIGN l cystic 7.6cm left teratoma WITH ENDO CATCH REMOVAL and cauterization of endometriosis  . nasal septoplasty  '11  . WISDOM TOOTH EXTRACTION     Allergies  Allergen Reactions  . Lactose Intolerance (Gi) Hives    ANY MILK  BASED PROTEIN  . Whey Protein [Protein] Hives   Prior to Admission medications   Medication Sig Start Date End Date Taking? Authorizing Provider  ALPRAZolam [name]) 0.5 MG tablet Take 1 tablet (0.5 mg total) by mouth at bedtime as needed for  anxiety. 09/30/18  Yes Wendie Agreste, MD   Social History   Socioeconomic History  . Marital status: Single    Spouse name: Not on file  . Number of children: Not on file  . Years of education: Not on file  . Highest education level: Not on file  Occupational History  . Not on file  Social Needs  . Financial resource strain: Not on file  . Food insecurity:    Worry: Not on file    Inability: Not on file  . Transportation needs:    Medical: Not on file    Non-medical: Not on file  Tobacco Use  . Smoking status: Former Smoker    Packs/day: 1.00    Last attempt to quit: 04/12/2004    Years since quitting: 14.5  . Smokeless tobacco: Never Used  Substance and Sexual Activity  . Alcohol use: Yes    Comment: social  . Drug use: No  . Sexual activity: Yes  Lifestyle  . Physical activity:    Days per week: Not on file    Minutes per session: Not on file  . Stress: Not on file  Relationships  . Social connections:    Talks on phone: Not on file    Gets together: Not on file    Attends religious service: Not on file    Active member of club or organization: Not on file    Attends meetings of clubs or organizations: Not on file    Relationship status: Not on file  . Intimate partner violence:    Fear of current or ex partner: Not on file    Emotionally abused: Not on file    Physically abused: Not on file    Forced sexual activity: Not on file  Other Topics Concern  . Not on file  Social History Narrative  . Not on file    Review of Systems Per HPI.     Objective:   Physical Exam Vitals signs reviewed.  Constitutional:      Appearance: She is well-developed.  HENT:     Head: Normocephalic and atraumatic.  Eyes:     Conjunctiva/sclera: Conjunctivae normal.     Pupils: Pupils are equal, round, and reactive to light.  Neck:     Vascular: No carotid bruit.  Cardiovascular:     Rate and Rhythm: Normal rate and regular rhythm.     Heart sounds: Normal heart  sounds.  Pulmonary:     Effort: Pulmonary effort is normal.     Breath sounds: Normal breath sounds.  Abdominal:     Palpations: Abdomen is soft. There is no pulsatile mass.     Tenderness: There is no abdominal tenderness.  Skin:    General: Skin is warm and dry.  Neurological:     Mental Status: She is alert and  oriented to person, place, and time.  Psychiatric:        Behavior: Behavior normal.    Vitals:   10/22/18 1434  BP: 120/72  Pulse: 96  Resp: 14  Temp: 98.7 F (37.1 C)  TempSrc: Oral  SpO2: 98%  Weight: 146 lb 12.8 oz (66.6 kg)  Height: 5' 4.5" (1.638 m)   EKG: Sinus rhythm, QTc 428, no acute findings     Assessment & Plan:    Ruth [name] is a 38 y.o. female Anxiety state Psychophysiological insomnia - Plan: ALPRAZolam (XANAX) 0.5 MG tablet  -Incomplete treatment with hydroxyzine previously.  Decided to continue Xanax at current dose for now.  Still has some sleep issues with taking Xanax.  Recommended follow-up with sleep specialist, plans to schedule, new referral can be placed if needed.  Plan to follow-up next 4 to 6 weeks to discuss anxiety further.  Chest pain, unspecified type - Plan: EKG 12-Lead, Ambulatory referral to Cardiology Tachycardia - Plan: EKG 12-Lead, Ambulatory referral to Cardiology  -Reassuring EKG in office.  Longstanding chest pain reportedly costochondritis.  Still may have some component of anxiety contributing symptoms, but also with tachycardia as above.  Outside labs reviewed, reassuring.  -Refer to cardiology for further evaluation and work-up.  ER precautions given if any return of chest pain or worsening symptoms.  Meds ordered this encounter  Medications  . ALPRAZolam (XANAX) 0.5 MG tablet    Sig: Take 1 tablet (0.5 mg total) by mouth at bedtime as needed for anxiety.    Dispense:  30 tablet    Refill:  1   Patient Instructions   For chest symptoms I will refer you to cardiology.  Low intensity exercise only for now  until you have been cleared for more vigorous exercise with cardiology.  Please bring copy of labs with you to cardiology appointment.  Please call sleep specialist for appointment.  If they need any new referral let me know.  Xanax continued same dose for now but please follow-up in the next 4 to 6 weeks to discuss sleep and anxiety symptoms further.  Thank you for coming in today.    Nonspecific Chest Pain Chest pain can be caused by many different conditions. Some causes of chest pain can be life-threatening. These will require treatment right away. Serious causes of chest pain include:  Heart attack.  A tear in the body's main blood vessel.  Redness and swelling (inflammation) around your heart.  Blood clot in your lungs. Other causes of chest pain may not be so serious. These include:  Heartburn.  Anxiety or stress.  Damage to bones or muscles in your chest.  Lung infections. Chest pain can feel like:  Pain or discomfort in your chest.  Crushing, pressure, aching, or squeezing pain.  Burning or tingling.  Dull or sharp pain that is worse when you move, cough, or take a deep breath.  Pain or discomfort that is also felt in your back, neck, jaw, shoulder, or arm, or pain that spreads to any of these areas. It is hard to know whether your pain is caused by something that is serious or something that is not so serious. So it is important to see your doctor right away if you have chest pain. Follow these instructions at home: Medicines  Take over-the-counter and prescription medicines only as told by your doctor.  If you were prescribed an antibiotic medicine, take it as told by your doctor. Do not stop taking the antibiotic  even if you start to feel better. Lifestyle   Rest as told by your doctor.  Do not use any products that contain nicotine or tobacco, such as cigarettes, e-cigarettes, and chewing tobacco. If you need help quitting, ask your doctor.  Do not  drink alcohol.  Make lifestyle changes as told by your doctor. These may include: ? Getting regular exercise. Ask your doctor what activities are safe for you. ? Eating a heart-healthy diet. A diet and nutrition specialist (dietitian) can help you to learn healthy eating options. ? Staying at a healthy weight. ? Treating diabetes or high blood pressure, if needed. ? Lowering your stress. Activities such as yoga and relaxation techniques can help. General instructions  Pay attention to any changes in your symptoms. Tell your doctor about them or any new symptoms.  Avoid any activities that cause chest pain.  Keep all follow-up visits as told by your doctor. This is important. You may need more testing if your chest pain does not go away. Contact a doctor if:  Your chest pain does not go away.  You feel depressed.  You have a fever. Get help right away if:  Your chest pain is worse.  You have a cough that gets worse, or you cough up blood.  You have very bad (severe) pain in your belly (abdomen).  You pass out (faint).  You have either of these for no clear reason: ? Sudden chest discomfort. ? Sudden discomfort in your arms, back, neck, or jaw.  You have shortness of breath at any time.  You suddenly start to sweat, or your skin gets clammy.  You feel sick to your stomach (nauseous).  You throw up (vomit).  You suddenly feel lightheaded or dizzy.  You feel very weak or tired.  Your heart starts to beat fast, or it feels like it is skipping beats. These symptoms may be an emergency. Do not wait to see if the symptoms will go away. Get medical help right away. Call your local emergency services (911 in the [place] Do not drive yourself to the hospital. Summary  Chest pain can be caused by many different conditions. The cause may be serious and need treatment right away. If you have chest pain, see your doctor right away.  Follow your doctor's instructions for taking  medicines and making lifestyle changes.  Keep all follow-up visits as told by your doctor. This includes visits for any further testing if your chest pain does not go away.  Be sure to know the signs that show that your condition has become worse. Get help right away if you have these symptoms. This information is not intended to replace advice given to you by your health care provider. Make sure you discuss any questions you have with your health care provider. Document Released: 03/24/2008 Document Revised: 04/08/2018 Document Reviewed: 04/08/2018 Elsevier Interactive Patient Education  [employer].   If you have lab work done today you will be contacted with your lab results within the next 2 weeks.  If you have not heard from [place] then please contact us. The fastest way to get your results is to register for My Chart.   IF you received an x-ray today, you will receive an invoice from [facility]. Please contact [facility] at [phone] with questions or concerns regarding your invoice.   IF you received labwork today, you will receive an invoice from [facility]. Please contact [facility] at [phone] with questions or concerns regarding your invoice.  Our billing staff will not be able to assist you with questions regarding bills from these companies.  You will be contacted with the lab results as soon as they are available. The fastest way to get your results is to activate your My Chart account. Instructions are located on the last page of this paperwork. If you have not heard from [place] regarding the results in 2 weeks, please contact this office.       Signed,   [name], MD [facility].  10/24/18 10:11 PM

## 2018-10-23 ENCOUNTER — Encounter: Payer: Self-pay | Admitting: Family Medicine

## 2018-10-24 ENCOUNTER — Encounter: Payer: Self-pay | Admitting: Family Medicine

## 2018-11-10 ENCOUNTER — Telehealth: Payer: Self-pay | Admitting: Family Medicine

## 2018-11-10 DIAGNOSIS — G4700 Insomnia, unspecified: Secondary | ICD-10-CM

## 2018-11-10 NOTE — Telephone Encounter (Signed)
Please advise  Copied from [place] [phone]. Topic: Referral - Request for Referral >> Nov 10, 2018  2:44 PM [name] wrote: Has patient seen PCP for this complaint? Yes.  Spoke with provider about this at last visit *If NO, is insurance requiring patient see PCP for this issue before PCP can refer them? Referral for which specialty: sleep study Preferred provider/office:   [facility]    Reason for referral: pt had to pay out of pocket last year due to insurance but has updated her insurance she had an referral last year

## 2018-11-10 NOTE — Telephone Encounter (Signed)
Please advise 

## 2018-12-01 NOTE — Progress Notes (Signed)
Cardiology Office Note   Date:  12/02/2018   ID:  Ruth [name], DOB 03/03/1981, MRN [id number]  PCP:  Wendie Agreste, MD  Cardiologist:   No primary care provider on file. Referring:  Wendie Agreste, MD  Chief Complaint  Patient presents with  . Shortness of Breath      History of Present Illness: Ruth [name] is a 38 y.o. female who is referred by Wendie Agreste, MD for evaluatoin of chest pain.   He was in the hospital in Oct for this and I reviewed these records.  He had chest pain without objective evidence of a cardiac etiology.  She reports that she is been having symptoms such as when she exercises briskly in the middle of exercise her heart rate will go down.  She is always been athletic and actively trained.  However, this is been a relatively new phenomenon happen about 6-7 times.  She will get dizzy and lightheaded.  She is had some chest pain which is why she presented in October.  She was having significant vertigo symptoms at that time.  She has had some increased heart rate and increased blood pressure.  She was found to be somewhat hyperthyroid and actually treated this herself with decreased iodine.  She treated some of her tachycardia with increased water.  She uses on on iodized salt.  She is done a little bit better but she wants to get back to intensive training.  She wanted to be seen before doing this.  She is not had any frank syncope.  She has costochondritis but she is not describing substernal pain.  She is not having any new shortness of breath, PND or orthopnea.  She is very educated about medical issues.  She is never had any prior cardiac work-up.  Of note I did see recent labs.  Her lipids were excellent.  Her TSH is normalized to 1.04 having been abnormal prior to her diet change.  Past Medical History:  Diagnosis Date  . Allergy   . Arthritis   . Frequency-urgency sydrome   . GERD (gastroesophageal reflux disease)    no meds  . PONV  (postoperative nausea and vomiting)     Past Surgical History:  Procedure Laterality Date  . LAPAROSCOPY  04/19/2012   Procedure: LAPAROSCOPY DIAGNOSTIC;  Surgeon: Selinda Orion, MD;  Location: [facility];  Service: Gynecology;  Laterality: N/A;  left ovarian cystectomy with excision of BENIGN l cystic 7.6cm left teratoma WITH ENDO CATCH REMOVAL and cauterization of endometriosis  . nasal septoplasty  '11  . WISDOM TOOTH EXTRACTION       Current Outpatient Medications  Medication Sig Dispense Refill  . ALPRAZolam (XANAX) 0.5 MG tablet Take 1 tablet (0.5 mg total) by mouth at bedtime as needed for anxiety. 30 tablet 1   No current facility-administered medications for this visit.     Allergies:   Lactose intolerance (gi) and Whey protein [protein]    Social History:  The patient  reports that she quit smoking about 14 years ago. She smoked 1.00 pack per day. She has never used smokeless tobacco. She reports current alcohol use. She reports that she does not use drugs.   Family History:  The patient's family history includes Cancer in her maternal grandmother; Diabetes in her maternal grandmother; Stroke in her father.    ROS:  Please see the history of present illness.   Otherwise, review of systems are positive for  none.   All other systems are reviewed and negative.    PHYSICAL EXAM: VS:  BP 119/86   Pulse (!) 103   Ht 5' 4.4" (1.636 m)   Wt 150 lb 3.2 oz (68.1 kg)   BMI 25.46 kg/m  , BMI Body mass index is 25.46 kg/m. GENERAL:  Well appearing HEENT:  Pupils equal round and reactive, fundi not visualized, oral mucosa unremarkable NECK:  No jugular venous distention, waveform within normal limits, carotid upstroke brisk and symmetric, no bruits, no thyromegaly LYMPHATICS:  No cervical, inguinal adenopathy LUNGS:  Clear to auscultation bilaterally BACK:  No CVA tenderness CHEST:  Unremarkable HEART:  PMI not displaced or sustained,S1 and S2 within normal  limits, no S3, no S4, no clicks, no rubs, no murmurs ABD:  Flat, positive bowel sounds normal in frequency in pitch, no bruits, no rebound, no guarding, no midline pulsatile mass, no hepatomegaly, no splenomegaly EXT:  2 plus pulses throughout, no edema, no cyanosis no clubbing SKIN:  No rashes no nodules NEURO:  Cranial nerves II through XII grossly intact, motor grossly intact throughout PSYCH:  Cognitively intact, oriented to person place and time    EKG:  EKG is ordered today. The ekg ordered today demonstrates sinus rhythm, rate 90, axis within normal limits, intervals within normal limits, no acute ST-T wave changes.   Recent Labs: 08/12/2018: BUN 8; Creatinine, Ser 0.60; Hemoglobin 15.0; Potassium 3.8; Sodium 140    Lipid Panel    Component Value Date/Time   CHOL 151 01/12/2012 1317   TRIG 95 01/12/2012 1317   HDL 54 01/12/2012 1317   CHOLHDL 2.8 01/12/2012 1317   VLDL 19 01/12/2012 1317   LDLCALC 78 01/12/2012 1317      Wt Readings from Last 3 Encounters:  12/02/18 150 lb 3.2 oz (68.1 kg)  10/22/18 146 lb 12.8 oz (66.6 kg)  06/24/18 145 lb 9.6 oz (66 kg)      Other studies Reviewed: Additional studies/ records that were reviewed today include: Labs.  Hospital records Review of the above records demonstrates:  Please see elsewhere in the note.     ASSESSMENT AND PLAN:  CHEST PAIN: Patient's chest pain is somewhat atypical.  I do not strongly suspect obstructive coronary disease. I will bring the patient back for a POET (Plain Old Exercise Test). This will allow me to screen for obstructive coronary disease and most importantly evaluate as below.  ARRHYTHMIA: She has multiple symptoms as above.  I am to try to reproduce some of this with the treadmill test above.  If this is normal she can be released to do the intensive training that she wants.  I think that she has some element of orthostasis as her blood pressure dropped slightly with standing at 3 minutes in the  office.  She might have some slight element of postural tachycardia.  We talked at length about improving her vagal tone.  She is can continue the high salt diet with low iodine.  She is going to look into getting an Alive Cor.  She will let me know if she improves with this.   Current medicines are reviewed at length with the patient today.  The patient does not have concerns regarding medicines.  The following changes have been made:  no change  Labs/ tests ordered today include:   Orders Placed This Encounter  Procedures  . EXERCISE TOLERANCE TEST (ETT)  . EKG 12-Lead     Disposition:   FU with me as  needed    Signed, Minus Breeding, MD  12/02/2018 3:55 PM    [place]

## 2018-12-02 ENCOUNTER — Ambulatory Visit: Payer: BLUE CROSS/BLUE SHIELD | Admitting: Cardiology

## 2018-12-02 ENCOUNTER — Encounter: Payer: Self-pay | Admitting: Cardiology

## 2018-12-02 VITALS — BP 119/86 | HR 103 | Ht 64.4 in | Wt 150.2 lb

## 2018-12-02 DIAGNOSIS — R000 Tachycardia, unspecified: Secondary | ICD-10-CM | POA: Diagnosis not present

## 2018-12-02 DIAGNOSIS — R079 Chest pain, unspecified: Secondary | ICD-10-CM

## 2018-12-02 NOTE — Patient Instructions (Signed)
Medication Instructions:  Continue current medications  If you need a refill on your cardiac medications before your next appointment, please call your pharmacy.  Labwork: None Ordered   Take the provided lab slips with you to the lab for your blood draw.   When you have your labs (blood work) drawn today and your tests are completely normal, you will receive your results only by MyChart Message (if you have MyChart) -OR-  A paper copy in the mail.  If you have any lab test that is abnormal or we need to change your treatment, we will call you to review these results.  Testing/Procedures: Your physician has requested that you have an exercise tolerance test. For further information please visit [email or web address]. Please also follow instruction sheet, as given.  Special Instructions: ALIVECOR  Follow-Up: . Your physician recommends that you schedule a follow-up appointment in: As Needed   At [facility], you and your health needs are our priority.  As part of our continuing mission to provide you with exceptional heart care, we have created designated Provider Care Teams.  These Care Teams include your primary Cardiologist (physician) and Advanced Practice Providers (APPs -  Physician Assistants and Nurse Practitioners) who all work together to provide you with the care you need, when you need it.  Thank you for choosing [facility] at [facility]!!

## 2018-12-03 ENCOUNTER — Encounter: Payer: Self-pay | Admitting: Family Medicine

## 2018-12-03 ENCOUNTER — Other Ambulatory Visit: Payer: Self-pay

## 2018-12-03 ENCOUNTER — Telehealth (HOSPITAL_COMMUNITY): Payer: Self-pay

## 2018-12-03 ENCOUNTER — Ambulatory Visit: Payer: BLUE CROSS/BLUE SHIELD | Admitting: Family Medicine

## 2018-12-03 VITALS — BP 112/79 | HR 82 | Temp 98.0°F | Ht 64.5 in | Wt 145.0 lb

## 2018-12-03 DIAGNOSIS — F5104 Psychophysiologic insomnia: Secondary | ICD-10-CM

## 2018-12-03 DIAGNOSIS — R000 Tachycardia, unspecified: Secondary | ICD-10-CM | POA: Diagnosis not present

## 2018-12-03 NOTE — Patient Instructions (Addendum)
I would not recommend any med changes for now.   Follow-up in the next 4 to 6 weeks after you have had some of the testing from cardiology and evaluation with sleep specialist.  Let me know if there are questions in the meantime.  If you have lab work done today you will be contacted with your lab results within the next 2 weeks.  If you have not heard from [place] then please contact us. The fastest way to get your results is to register for My Chart.   IF you received an x-ray today, you will receive an invoice from [facility]. Please contact [facility] at [phone] with questions or concerns regarding your invoice.   IF you received labwork today, you will receive an invoice from [place]. Please contact [facility] at [phone] with questions or concerns regarding your invoice.   Our billing staff will not be able to assist you with questions regarding bills from these companies.  You will be contacted with the lab results as soon as they are available. The fastest way to get your results is to activate your My Chart account. Instructions are located on the last page of this paperwork. If you have not heard from [place] regarding the results in 2 weeks, please contact this office.

## 2018-12-03 NOTE — Progress Notes (Signed)
Subjective:    Patient ID: Ruth [name], female    DOB: September 05, 1981, 38 y.o.   MRN: [id number]  HPI Ruth [name] is a 38 y.o. female Presents today for: Chief Complaint  Patient presents with  . Anxiety    f/u / med refill. Gad 7 screening done   Anxiety: See previous discussions and attempted treatments.  We have tried changing Xanax to hydroxyzine with various dose options in the past but did not tolerate hydroxyzine.  Lower dose of Xanax use but frequent wakening.  Ultimately decided to continue Xanax 0.5 mg to help with sleep. Was still having some sleep difficulty with frequent wakening last visit and plan for meeting with sleep specialist, referral was placed.  Additionally had discussed some heart palpitations and chest pains that she had experienced prior to her last visit.  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  She did have some lab work she had obtained on her own in December with normal TSH, normal lipid panel with LDL 86, normal CMP and CBC with hemoglobin 13.4. We have discussed low-dose SSRI in the past if persistent need for benzodiazepine.  She did not tolerate Effexor in 2018 - felt like lowered energy level and worse fatigue.  meds during the day are too sedating or more anxious if stimulant/activating med.  Sleep specialist eval on March 4th.   Seen by cardiology yesterday for the tachycardia, chest pain.  Dr. Percival Spanish.  Atypical pain, did not suspect obstructive CAD but plan for plain exercise test.  Also plan to evaluate for reproduction of arrhythmia with the treadmill test.  If treadmill test normal plan to return to intensive training as tolerated.  Additionally thought to have some element of orthostasis and possible postural tachycardia and instructed on ways to improve a vagal tone.  Planned on high salt diet with low iodine.  Follow-up as needed.  Feels better - less frequent bathroom trips at night  - slept 6hrs straight the other night. More salt in diet.   Some fatigue anxiety, but sx's seem to be primarily centered around sleep.  Taking alprazolam 0.5mg  at night.  Seems to be tired most of the time.   GAD 7: 7  Xanax Last filled 11/22/18, #30.     No flowsheet data found.   Depression screen [facility] 2/9 12/03/2018 10/22/2018 10/22/2018 11/03/2017 08/25/2017  Decreased Interest 0 0 0 0 0  Down, Depressed, Hopeless 0 0 0 0 0  PHQ - 2 Score 0 0 0 0 0     Patient Active Problem List   Diagnosis Date Noted  . Chest pain 12/02/2018  . Tachycardia 12/02/2018  . Chronic neck pain 12/29/2016  . Chronic pain syndrome 12/29/2016  . Nonallopathic lesion of cervical region 12/29/2016  . Nonallopathic lesion of thoracic region 12/29/2016  . Nonallopathic lesion of lumbosacral region 12/29/2016  . 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 HYPOTENSION 07/23/2007  . ACNE, MILD 07/23/2007   Past Medical History:  Diagnosis Date  . Allergy   . Arthritis   . Frequency-urgency sydrome   . GERD (gastroesophageal reflux disease)    no meds  . PONV (postoperative nausea and vomiting)    Past Surgical History:  Procedure Laterality Date  . LAPAROSCOPY  04/19/2012   Procedure: LAPAROSCOPY DIAGNOSTIC;  Surgeon: Selinda Orion, MD;  Location: [facility];  Service: Gynecology;  Laterality: N/A;  left ovarian cystectomy with excision of BENIGN l  cystic 7.6cm left teratoma WITH ENDO CATCH REMOVAL and cauterization of endometriosis  . nasal septoplasty  '11  . WISDOM TOOTH EXTRACTION     Allergies  Allergen Reactions  . Lactose Intolerance (Gi) Hives    ANY MILK  BASED PROTEIN  . Whey Protein [Protein] Hives   Prior to Admission medications   Medication Sig Start Date End Date Taking? Authorizing Provider  ALPRAZolam [name]) 0.5 MG tablet Take 1 tablet (0.5 mg total) by mouth at bedtime as needed for anxiety. 10/22/18  Yes Wendie Agreste, MD   Social History   Socioeconomic  History  . Marital status: Single    Spouse name: Not on file  . Number of children: Not on file  . Years of education: Not on file  . Highest education level: Not on file  Occupational History  . Not on file  Social Needs  . Financial resource strain: Not on file  . Food insecurity:    Worry: Not on file    Inability: Not on file  . Transportation needs:    Medical: Not on file    Non-medical: Not on file  Tobacco Use  . Smoking status: Former Smoker    Packs/day: 1.00    Last attempt to quit: 04/12/2004    Years since quitting: 14.6  . Smokeless tobacco: Never Used  Substance and Sexual Activity  . Alcohol use: Yes    Comment: social  . Drug use: No  . Sexual activity: Yes  Lifestyle  . Physical activity:    Days per week: Not on file    Minutes per session: Not on file  . Stress: Not on file  Relationships  . Social connections:    Talks on phone: Not on file    Gets together: Not on file    Attends religious service: Not on file    Active member of club or organization: Not on file    Attends meetings of clubs or organizations: Not on file    Relationship status: Not on file  . Intimate partner violence:    Fear of current or ex partner: Not on file    Emotionally abused: Not on file    Physically abused: Not on file    Forced sexual activity: Not on file  Other Topics Concern  . Not on file  Social History Narrative   Lives with partner.      Review of Systems As above.     Objective:   Physical Exam Vitals signs reviewed.  Constitutional:      Appearance: She is well-developed.  HENT:     Head: Normocephalic and atraumatic.  Eyes:     Conjunctiva/sclera: Conjunctivae normal.     Pupils: Pupils are equal, round, and reactive to light.  Neck:     Vascular: No carotid bruit.  Cardiovascular:     Rate and Rhythm: Normal rate and regular rhythm.     Heart sounds: Normal heart sounds.  Pulmonary:     Effort: Pulmonary effort is normal.     Breath  sounds: Normal breath sounds.  Abdominal:     Palpations: Abdomen is soft. There is no pulsatile mass.     Tenderness: There is no abdominal tenderness.  Skin:    General: Skin is warm and dry.  Neurological:     Mental Status: She is alert and oriented to person, place, and time.  Psychiatric:        Behavior: Behavior normal.    Vitals:  12/03/18 1351  BP: 112/79  Pulse: 82  Temp: 98 F (36.7 C)  TempSrc: Oral  SpO2: 100%  Weight: 145 lb (65.8 kg)  Height: 5' 4.5" (1.638 m)       Assessment & Plan:  Ruth [name] is a 38 y.o. female Insomnia, psychophysiological  Tachycardia  Still suspected psychophysiological insomnia, denies daytime anxiety symptoms.  Decided against changes to medication such as BuSpar or SSRI at this time as we will wait for information from sleep study and evaluation with sleep specialist.  Continue current dose of alprazolam at bedtime.   Tachycardia symptoms improved as well as waking up at night.  Thought to have component of postural orthostatic tachycardia syndrome but currently improved.  Stress testing planned to evaluate for other arrhythmia or concerns.  Continue follow-up with cardiology as planned.   No orders of the defined types were placed in this encounter.  Patient Instructions   I would not recommend any med changes for now.   Follow-up in the next 4 to 6 weeks after you have had some of the testing from cardiology and evaluation with sleep specialist.  Let me know if there are questions in the meantime.  If you have lab work done today you will be contacted with your lab results within the next 2 weeks.  If you have not heard from [place] then please contact us. The fastest way to get your results is to register for My Chart.   IF you received an x-ray today, you will receive an invoice from [facility]. Please contact [facility] at [phone] with questions or concerns regarding your invoice.   IF you  received labwork today, you will receive an invoice from [facility]. Please contact [facility] at [phone] with questions or concerns regarding your invoice.   Our billing staff will not be able to assist you with questions regarding bills from these companies.  You will be contacted with the lab results as soon as they are available. The fastest way to get your results is to activate your My Chart account. Instructions are located on the last page of this paperwork. If you have not heard from [place] regarding the results in 2 weeks, please contact this office.       Signed,   [name], MD [facility].  12/04/18 11:46 AM

## 2018-12-03 NOTE — Telephone Encounter (Signed)
Encounter complete. 

## 2018-12-04 ENCOUNTER — Encounter: Payer: Self-pay | Admitting: Family Medicine

## 2018-12-07 ENCOUNTER — Ambulatory Visit (HOSPITAL_COMMUNITY)
Admission: RE | Admit: 2018-12-07 | Discharge: 2018-12-07 | Disposition: A | Discharge status: Home / Self Care | Payer: BLUE CROSS/BLUE SHIELD | Source: Ambulatory Visit | Attending: Cardiovascular Disease | Admitting: Cardiovascular Disease

## 2018-12-07 DIAGNOSIS — R000 Tachycardia, unspecified: Secondary | ICD-10-CM | POA: Diagnosis not present

## 2018-12-08 LAB — EXERCISE TOLERANCE TEST
CHL CUP RESTING HR STRESS: 97 {beats}/min
CSEPED: 10 min
CSEPEW: 11.9 METS
Exercise duration (sec): 9 s
MPHR: 182 {beats}/min
Peak HR: 196 {beats}/min
Percent HR: 107 %
RPE: 19

## 2018-12-22 ENCOUNTER — Encounter: Payer: Self-pay | Admitting: Neurology

## 2018-12-22 ENCOUNTER — Ambulatory Visit: Payer: BLUE CROSS/BLUE SHIELD | Admitting: Neurology

## 2018-12-22 VITALS — BP 112/75 | HR 88 | Ht 64.5 in | Wt 145.0 lb

## 2018-12-22 DIAGNOSIS — G479 Sleep disorder, unspecified: Secondary | ICD-10-CM | POA: Diagnosis not present

## 2018-12-22 DIAGNOSIS — R519 Headache, unspecified: Secondary | ICD-10-CM

## 2018-12-22 DIAGNOSIS — R51 Headache: Secondary | ICD-10-CM

## 2018-12-22 DIAGNOSIS — R0683 Snoring: Secondary | ICD-10-CM

## 2018-12-22 DIAGNOSIS — G4700 Insomnia, unspecified: Secondary | ICD-10-CM

## 2018-12-22 DIAGNOSIS — R6889 Other general symptoms and signs: Secondary | ICD-10-CM

## 2018-12-22 DIAGNOSIS — R351 Nocturia: Secondary | ICD-10-CM

## 2018-12-22 NOTE — Progress Notes (Signed)
Subjective:    Patient ID: Ruth [name] is a 38 y.o. female.  HPI     Ruth Age, MD, PhD [facility] [address] [place], [place] [address]  Dear Dr. Carlota Raspberry,  I saw your patient, Ruth [name], upon your kind request in my sleep clinic today for initial consultation of her sleep disorder, in particular, difficulty with sleep initiation and sleep maintenance for many years, probably since childhood. The patient is unaccompanied today. As you know, Ruth [name] is a 38 year old right-handed woman with an underlying medical history of anxiety, reflux disease, arthritis, and allergies, who reports a longstanding history of difficulty with her sleep including difficulty initiating sleep and maintaining sleep and a family history of insomnia. She is not aware of any family history of OSA on narcolepsy. She reports vivid dreams and nightmares at times. She has a long-standing history of anxiety. She is not currently on an antidepressant. She has seen a therapist before but no longer sees a therapist. She has tried improving her sleep hygiene and relaxation as well as meditation. Anxiety is worse at night. She has been taking Xanax 0.5 mg at bedtime for the past 3+ years. I reviewed your office note from 12/03/2018. She lives with 2 friends. She has no pets currently. No TV in the BR. She has tried Benadryl in the past and in the distant past she tried melatonin. Her bedtime is between 1 and 2 and, but it is not unusual for her to not fall asleep until 4 AM. She has nocturia about twice per average night. She has had some morning headaches. She had septum surgery and turbinate reduction in 2011 or thereabouts. She has a rise time of 9:51 AM, but not unusual to be as late as 11 or noon. Epworth sleepiness score is 0 out of 24 today, fatigue score is 42 out of 63.  Her Past Medical History Is Significant For: Past Medical History:  Diagnosis Date  . Allergy   .  Arthritis   . Frequency-urgency sydrome   . GERD (gastroesophageal reflux disease)    no meds  . PONV (postoperative nausea and vomiting)     Her Past Surgical History Is Significant For: Past Surgical History:  Procedure Laterality Date  . LAPAROSCOPY  04/19/2012   Procedure: LAPAROSCOPY DIAGNOSTIC;  Surgeon: Selinda Orion, MD;  Location: [facility];  Service: Gynecology;  Laterality: N/A;  left ovarian cystectomy with excision of BENIGN l cystic 7.6cm left teratoma WITH ENDO CATCH REMOVAL and cauterization of endometriosis  . nasal septoplasty  '11  . WISDOM TOOTH EXTRACTION      Her Family History Is Significant For: Family History  Problem Relation [name] of Onset  . Stroke Father   . Cancer Maternal Grandmother   . Diabetes Maternal Grandmother     Her Social History Is Significant For: Social History   Socioeconomic History  . Marital status: Single    Spouse name: Not on file  . Number of children: Not on file  . Years of education: Not on file  . Highest education level: Not on file  Occupational History  . Not on file  Social Needs  . Financial resource strain: Not on file  . Food insecurity:    Worry: Not on file    Inability: Not on file  . Transportation needs:    Medical: Not on file    Non-medical: Not on file  Tobacco Use  . Smoking  status: Former Smoker    Packs/day: 1.00    Last attempt to quit: 04/12/2004    Years since quitting: 14.7  . Smokeless tobacco: Never Used  Substance and Sexual Activity  . Alcohol use: Yes    Comment: social  . Drug use: No  . Sexual activity: Yes  Lifestyle  . Physical activity:    Days per week: Not on file    Minutes per session: Not on file  . Stress: Not on file  Relationships  . Social connections:    Talks on phone: Not on file    Gets together: Not on file    Attends religious service: Not on file    Active member of club or organization: Not on file    Attends meetings of clubs or  organizations: Not on file    Relationship status: Not on file  Other Topics Concern  . Not on file  Social History Narrative   Lives with partner.      Her Allergies Are:  Allergies  Allergen Reactions  . Lactose Intolerance (Gi) Hives    ANY MILK  BASED PROTEIN  . Whey Protein [Protein] Hives  :   Her Current Medications Are:  Outpatient Encounter Medications as of 12/22/2018  Medication Sig  . ALPRAZolam (XANAX) 0.5 MG tablet Take 1 tablet (0.5 mg total) by mouth at bedtime as needed for anxiety.   No facility-administered encounter medications on file as of 12/22/2018.   :  Review of Systems:  Out of a complete 14 point review of systems, all are reviewed and negative with the exception of these symptoms as listed below:  Review of Systems  Neurological:       Pt presents today to discuss her sleep. Pt has never had a sleep study but does endorse snoring.  Epworth Sleepiness Scale 0= would never doze 1= slight chance of dozing 2= moderate chance of dozing 3= high chance of dozing  Sitting and reading: 0 Watching TV: 0 Sitting inactive in a public place (ex. Theater or meeting): 0 As a passenger in a car for an hour without a break: 0 Lying down to rest in the afternoon: 0 Sitting and talking to someone: 0 Sitting quietly after lunch (no alcohol): 0 In a car, while stopped in traffic: 0 Total: 0     Objective:  Neurological Exam  Physical Exam Physical Examination:   Vitals:   12/22/18 1539  BP: 112/75  Pulse: 88    General Examination: The patient is a very pleasant 38 y.o. female in no acute distress. She appears well-developed and well-nourished and well groomed.   HEENT: Normocephalic, atraumatic, pupils are equal, round and reactive to light. Extraocular tracking is good without limitation to gaze excursion or nystagmus noted. Normal smooth pursuit is noted. Hearing is grossly intact. Face is symmetric with normal facial animation and normal facial  sensation. Speech is clear with no dysarthria noted. There is no hypophonia. There is no lip, neck/head, jaw or voice tremor. Neck is supple with full range of passive and active motion. There are no carotid bruits on auscultation. Oropharynx exam reveals: mild mouth dryness, good dental hygiene and mild airway crowding, due tonsillar size of about 2+ bilaterally, left side about 3+ perhaps, a little more prominent than right. Mallampati is class I. Neck circumference is 13-1/2 inches.  Chest: Clear to auscultation without wheezing, rhonchi or crackles noted.  Heart: S1+S2+0, regular and normal without murmurs, rubs or gallops noted.   Abdomen: Soft,  non-tender and non-distended with normal bowel sounds appreciated on auscultation.  Extremities: There is no obvious edema.  Skin: Warm and dry without trophic changes noted. There are no.  Musculoskeletal: exam reveals no obvious joint deformities, tenderness or joint swelling or erythema.   Neurologically:  Mental status: The patient is awake, alert and oriented in all 4 spheres. Her immediate and remote memory, attention, language skills and fund of knowledge are appropriate. There is no evidence of aphasia, agnosia, apraxia or anomia. Speech is clear with normal prosody and enunciation. Thought process is linear. Mood is normal and affect is normal.  Cranial nerves II - XII are as described above under HEENT exam. In addition: shoulder shrug is normal with equal shoulder height noted. Motor exam: Normal bulk, strength and tone is noted. There is no drift, tremor or rebound. Romberg is negative. Fine motor skills and coordination: grossly intact.  Cerebellar testing: No dysmetria or intention tremor. There is no truncal or gait ataxia.  Sensory exam: intact to light touch.  Gait, station and balance: She stands easily. No veering to one side is noted. No leaning to one side is noted. Posture is [name]-appropriate and stance is narrow based. Gait  shows normal stride length and normal pace. No problems turning are noted. Tandem walk is unremarkable.   Assessment and Plan:  In summary, Karah C Zeller is a very pleasant 38 y.o.-year old female with an underlying medical history of anxiety, reflux disease, arthritis, and allergies, who presents for evaluation of her sleep disorder. She has a long-standing history of difficulty with sleep initiation and sleep maintenance. She does report anxiety which is more severe at night. She may benefit from more consistent treatment of her underlying anxiety. Furthermore, I suggested she try melatonin about 2 hours before her bedtime and try to gradually advance her bedtime and also use therapeutic bright light in the morning. I did recommend that we proceed with a sleep study to look into an underlying organic sleep disorder. She may benefit from cognitive behavioral therapy. She is encouraged to talk to you about a referral to sleep psychology, she had a therapist in the past, she could talk to him as well. We will keep her posted as to her sleep test authorization, and we will consider treatment for obstructive sleep apnea should she have sleep disordered breathing. She would be willing to try CPAP if necessary. I explained the sleep test procedure to the patient and also outlined possible surgical and non-surgical treatment options of OSA. I answered all her questions today and the patient was in agreement. I plan to see her back after the sleep study is completed and encouraged her to call with any interim questions, concerns, problems or updates.   Thank you very much for allowing me to participate in the care of this nice patient. If I can be of any further assistance to you please do not hesitate to call me at [phone].  Sincerely,   Ruth Age, MD, PhD

## 2018-12-22 NOTE — Patient Instructions (Addendum)
  Please try to gradually advance your bedtime in 15 minute increments every week or so. Try Melatonin 3 to 5 mg 1-2 hours before your projected bedtime to help you sleep and exposure yourself to bright light for about 20-30 minutes when you first wake up. Look online for a treatment grade bright light; they make desk lamps that work as treatment lights as well. I do think your underlying anxiety may have direct implications for your sleep disorder. You have most likely sleep phase delay disorder.  Based on your symptoms and your exam I believe we should look for an underlying organic sleep disorder, such as obstructive sleep apnea (OSA) with a sleep study.  If you have more than mild OSA, I want you to consider treatment with CPAP. Please remember, the risks and ramifications of moderate to severe obstructive sleep apnea or OSA are: Cardiovascular disease, including congestive heart failure, stroke, difficult to control hypertension, arrhythmias, and even type 2 diabetes has been linked to untreated OSA. Sleep apnea causes disruption of sleep and sleep deprivation in most cases, which, in turn, can cause recurrent headaches, problems with memory, mood, concentration, focus, and vigilance. Most people with untreated sleep apnea report excessive daytime sleepiness, which can affect their ability to drive. Please do not drive if you feel sleepy.   We will call you after your sleep study to advise about the results (most likely, you will hear from Cyril Mourning, my nurse).    Our sleep lab administrative assistant, will call you to schedule your sleep study. If you don't hear back from her by about 2 weeks from now, please feel free to call her at [phone]. This is her direct line and please leave a message with your phone number to call back if you get the voicemail box. She will call back as soon as possible.   Your chronic insomnia may be treated with cognitive behavioral therapy (CBT-I). Please talk to Dr.  Carlota Raspberry or your therapist about it.

## 2018-12-23 ENCOUNTER — Telehealth: Payer: Self-pay | Admitting: Family Medicine

## 2018-12-23 DIAGNOSIS — F5104 Psychophysiologic insomnia: Secondary | ICD-10-CM

## 2018-12-23 NOTE — Telephone Encounter (Signed)
Copied from [place] [phone]. Topic: General - Other >> Dec 23, 2018  2:28 PM Carolyn Stare wrote:  Pt request a refill ALPRAZolam [name]) 0.5 MG tablet    pt said she went to have the sleep study but since she dont have sleep apnea they would not do the study and she is asking if there is a another sleep clinic that does not require you to have sleep apnea    Pharmacy [place]

## 2018-12-24 MED ORDER — ALPRAZOLAM 0.5 MG PO TABS: 0.5000 mg | ORAL_TABLET | Freq: Every evening | ORAL | 1 refills | Status: DC | PRN

## 2018-12-24 NOTE — Telephone Encounter (Signed)
Dr Carlota Raspberry this patient would like a refill on xaxax she was last seen by you on 12/03/2018

## 2018-12-24 NOTE — Telephone Encounter (Signed)
Controlled substance database (PDMP) reviewed. No concerns appreciated. Last xanax #30 rx filled 11/22/18.   Refilled.

## 2019-01-01 NOTE — Progress Notes (Deleted)
[name] [place], [place] [address] Phone: [phone] Subjective:    I'm seeing this patient by the request  of:    CC: Back pain, neck pain  YIF:[id number]  Ruth [name] is a 39 y.o. female coming in with complaint of ***  Onset-  Location Duration-  Character- Aggravating factors- Reliving factors-  Therapies tried-  Severity-   Back and leg pain previously.  Did not do manipulation.  Started low-dose does have a past medical history significant for fibromyalgia and chronic pain syndrome.  Past Medical History:  Diagnosis Date  . Allergy   . Arthritis   . Frequency-urgency sydrome   . GERD (gastroesophageal reflux disease)    no meds  . PONV (postoperative nausea and vomiting)    Past Surgical History:  Procedure Laterality Date  . LAPAROSCOPY  04/19/2012   Procedure: LAPAROSCOPY DIAGNOSTIC;  Surgeon: Selinda Orion, MD;  Location: [facility];  Service: Gynecology;  Laterality: N/A;  left ovarian cystectomy with excision of BENIGN l cystic 7.6cm left teratoma WITH ENDO CATCH REMOVAL and cauterization of endometriosis  . nasal septoplasty  '11  . WISDOM TOOTH EXTRACTION     Social History   Socioeconomic History  . Marital status: Single    Spouse name: Not on file  . Number of children: Not on file  . Years of education: Not on file  . Highest education level: Not on file  Occupational History  . Not on file  Social Needs  . Financial resource strain: Not on file  . Food insecurity:    Worry: Not on file    Inability: Not on file  . Transportation needs:    Medical: Not on file    Non-medical: Not on file  Tobacco Use  . Smoking status: Former Smoker    Packs/day: 1.00    Last attempt to quit: 04/12/2004    Years since quitting: 14.7  . Smokeless tobacco: Never Used  Substance and Sexual Activity  . Alcohol use: Yes    Comment: social  . Drug use: No  . Sexual activity: Yes  Lifestyle   . Physical activity:    Days per week: Not on file    Minutes per session: Not on file  . Stress: Not on file  Relationships  . Social connections:    Talks on phone: Not on file    Gets together: Not on file    Attends religious service: Not on file    Active member of club or organization: Not on file    Attends meetings of clubs or organizations: Not on file    Relationship status: Not on file  Other Topics Concern  . Not on file  Social History Narrative   Lives with partner.     Allergies  Allergen Reactions  . Lactose Intolerance (Gi) Hives    ANY MILK  BASED PROTEIN  . Whey Protein [Protein] Hives   Family History  Problem Relation Age of Onset  . Stroke Father   . Cancer Maternal Grandmother   . Diabetes Maternal Grandmother          Current Outpatient Medications (Other):  Marland Kitchen  ALPRAZolam (XANAX) 0.5 MG tablet, Take 1 tablet (0.5 mg total) by mouth at bedtime as needed for anxiety.    Past medical history, social, surgical and family history all reviewed in electronic medical record.  No pertanent information unless stated regarding to the chief complaint.  Review of Systems:  No headache, visual changes, nausea, vomiting, diarrhea, constipation, dizziness, abdominal pain, skin rash, fevers, chills, night sweats, weight loss, swollen lymph nodes, body aches, joint swelling, muscle aches, chest pain, shortness of breath, mood changes.   Objective  There were no vitals taken for this visit. Systems examined below as of    General: No apparent distress alert and oriented x3 mood and affect normal, dressed appropriately.  HEENT: Pupils equal, extraocular movements intact  Respiratory: Patient's speak in full sentences and does not appear short of breath  Cardiovascular: No lower extremity edema, non tender, no erythema  Skin: Warm dry intact with no signs of infection or rash on extremities or on axial skeleton.  Abdomen: Soft nontender  Neuro: Cranial  nerves II through XII are intact, neurovascularly intact in all extremities with 2+ DTRs and 2+ pulses.  Lymph: No lymphadenopathy of posterior or anterior cervical chain or axillae bilaterally.  Gait normal with good balance and coordination.  MSK:  Non tender with full range of motion and good stability and symmetric strength and tone of shoulders, elbows, wrist, hip, knee and ankles bilaterally.     Impression and Recommendations:     This case required medical decision making of moderate complexity. The above documentation has been reviewed and is accurate and complete Lyndal Pulley, DO       Note: This dictation was prepared with Dragon dictation along with smaller phrase technology. Any transcriptional errors that result from this process are unintentional.

## 2019-01-03 ENCOUNTER — Encounter: Payer: Self-pay | Admitting: Physician Assistant

## 2019-01-03 ENCOUNTER — Telehealth: Payer: BLUE CROSS/BLUE SHIELD | Admitting: Physician Assistant

## 2019-01-03 ENCOUNTER — Emergency Department (HOSPITAL_COMMUNITY)
Admission: EM | Admit: 2019-01-03 | Discharge: 2019-01-03 | Disposition: A | Discharge status: Home / Self Care | Payer: BLUE CROSS/BLUE SHIELD | Attending: Emergency Medicine | Admitting: Emergency Medicine

## 2019-01-03 ENCOUNTER — Ambulatory Visit: Payer: BLUE CROSS/BLUE SHIELD | Admitting: Family Medicine

## 2019-01-03 ENCOUNTER — Other Ambulatory Visit: Payer: Self-pay

## 2019-01-03 ENCOUNTER — Encounter (HOSPITAL_COMMUNITY): Payer: Self-pay | Admitting: Emergency Medicine

## 2019-01-03 DIAGNOSIS — R059 Cough, unspecified: Secondary | ICD-10-CM

## 2019-01-03 DIAGNOSIS — R0602 Shortness of breath: Secondary | ICD-10-CM

## 2019-01-03 DIAGNOSIS — Z20828 Contact with and (suspected) exposure to other viral communicable diseases: Secondary | ICD-10-CM | POA: Diagnosis not present

## 2019-01-03 DIAGNOSIS — Z87891 Personal history of nicotine dependence: Secondary | ICD-10-CM | POA: Diagnosis not present

## 2019-01-03 DIAGNOSIS — J069 Acute upper respiratory infection, unspecified: Secondary | ICD-10-CM | POA: Insufficient documentation

## 2019-01-03 DIAGNOSIS — B9789 Other viral agents as the cause of diseases classified elsewhere: Secondary | ICD-10-CM | POA: Diagnosis not present

## 2019-01-03 DIAGNOSIS — R05 Cough: Secondary | ICD-10-CM | POA: Diagnosis not present

## 2019-01-03 DIAGNOSIS — R079 Chest pain, unspecified: Secondary | ICD-10-CM

## 2019-01-03 NOTE — ED Provider Notes (Signed)
[facility] EMERGENCY DEPARTMENT Provider Note   CSN: [id number] Arrival date & time: 01/03/19  1614    History   Chief Complaint Chief Complaint  Patient presents with  . Cough    HPI Ruth [name] is a 38 y.o. female.     HPI  38 year old female presents today with cough and URI symptoms.  Her symptoms began 1 week ago.  She had nasal congestion and cough with sore throat and body aches.  She had fever up to 102 over the weekend.  This has resolved now for 2 days.  She is taking ibuprofen for body aches but states her fever has not come back.  She is not dyspneic.  She does live with roommates.  She has not had any direct contact with somebody with coronavirus nor has she traveled.  However she does live with roommates and has met with people from the [place] area.  She was seen in a ED visit and told to come to the ED for further coded screening.  Past Medical History:  Diagnosis Date  . Allergy   . Arthritis   . Frequency-urgency sydrome   . GERD (gastroesophageal reflux disease)    no meds  . PONV (postoperative nausea and vomiting)     Patient Active Problem List   Diagnosis Date Noted  . Chest pain 12/02/2018  . Tachycardia 12/02/2018  . Chronic neck pain 12/29/2016  . Chronic pain syndrome 12/29/2016  . Nonallopathic lesion of cervical region 12/29/2016  . Nonallopathic lesion of thoracic region 12/29/2016  . Nonallopathic lesion of lumbosacral region 12/29/2016  . 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 HYPOTENSION 07/23/2007  . ACNE, MILD 07/23/2007    Past Surgical History:  Procedure Laterality Date  . LAPAROSCOPY  04/19/2012   Procedure: LAPAROSCOPY DIAGNOSTIC;  Surgeon: Selinda Orion, MD;  Location: [facility];  Service: Gynecology;  Laterality: N/A;  left ovarian cystectomy with excision of BENIGN l cystic 7.6cm left teratoma WITH ENDO CATCH REMOVAL and cauterization of endometriosis  . nasal septoplasty  '11  .  WISDOM TOOTH EXTRACTION       OB History   No obstetric history on file.      Home Medications    Prior to Admission medications   Medication Sig Start Date End Date Taking? Authorizing Provider  ALPRAZolam [name]) 0.5 MG tablet Take 1 tablet (0.5 mg total) by mouth at bedtime as needed for anxiety. 12/24/18   Wendie Agreste, MD    Family History Family History  Problem Relation Age of Onset  . Stroke Father   . Cancer Maternal Grandmother   . Diabetes Maternal Grandmother     Social History Social History   Tobacco Use  . Smoking status: Former Smoker    Packs/day: 1.00    Last attempt to quit: 04/12/2004    Years since quitting: 14.7  . Smokeless tobacco: Never Used  Substance Use Topics  . Alcohol use: Yes    Comment: social  . Drug use: No     Allergies   Lactose intolerance (gi) and Whey protein [protein]   Review of Systems Review of Systems  All other systems reviewed and are negative.    Physical Exam Updated Vital Signs BP (!) 122/95 (BP Location: Right Arm)   Pulse (!) 102   Temp 98.4 F (36.9 C) (Oral)   Resp 18   LMP 12/26/2018 (Exact Date)   SpO2 99%   Physical Exam Vitals signs and  nursing note reviewed.  Constitutional:      Appearance: Normal appearance.  HENT:     Head: Normocephalic.     Right Ear: Tympanic membrane and external ear normal.     Left Ear: Tympanic membrane and external ear normal.     Nose: Nose normal.     Mouth/Throat:     Mouth: Mucous membranes are moist.     Pharynx: Oropharynx is clear.  Eyes:     Pupils: Pupils are equal, round, and reactive to light.  Neck:     Musculoskeletal: Normal range of motion.  Cardiovascular:     Rate and Rhythm: Normal rate.     Pulses: Normal pulses.  Pulmonary:     Effort: Pulmonary effort is normal.     Breath sounds: Normal breath sounds.     Comments: Coughing on exam but lungs are clear no wheezing is noted Oxygen saturations 99% Abdominal:     General:  Abdomen is flat.  Musculoskeletal: Normal range of motion.  Skin:    General: Skin is warm and dry.     Capillary Refill: Capillary refill takes less than 2 seconds.  Neurological:     General: No focal deficit present.     Mental Status: She is alert.  Psychiatric:        Mood and Affect: Mood normal.      ED Treatments / Results  Labs (all labs ordered are listed, but only abnormal results are displayed) Labs Reviewed - No data to display  EKG None  Radiology No results found.  Procedures Procedures (including critical care time)  Medications Ordered in ED Medications - No data to display   Initial Impression / Assessment and Plan / ED Course  I have reviewed the triage vital signs and the nursing notes.  Pertinent labs & imaging results that were available during my care of the patient were reviewed by me and considered in my medical decision making (see chart for details).        Discussed risk factors with patient.  I think there is a high probability patient has flu.  However, she has had symptoms for 1 week and does it would not change her course to asked her.  She does not have a direct Covid 19 risk factors.   She is advised to self quarantine until symptoms are resolved.  She is advised regarding return precautions and need for follow-up and voices understanding Final Clinical Impressions(s) / ED Diagnoses   Final diagnoses:  Viral URI with cough    ED Discharge Orders    None       Pattricia Boss, MD 01/03/19 [phone]

## 2019-01-03 NOTE — ED Notes (Signed)
Pt reports fever and cough at home, states that she did an E-Visit and they recommended she comes here. Denies international travel.

## 2019-01-03 NOTE — Discharge Instructions (Addendum)
Your symptoms are consistent with flu You are considered low risk for covid Please self quarantine until symptoms have resolved. If your symptoms worsen, please seek medical advice Instruction sheet given

## 2019-01-03 NOTE — Progress Notes (Signed)
E-Visit for [employer] Virus Screening  Based on what you have shared with me, I feel your condition warrants further evaluation for Corona virus and I recommend that you be seen and evaluated "face to face". Our Emergency Departments are best equipped to handle patients with potential Corona Virus. I recommend the following:  . If you are having a true medical emergency please call 911. . Since you are considered high risk for Corona virus virus because of a known exposure, fever, shortness of breath and cough, OR if you have severe symptoms of any kind, seek medical care at an emergency room.  . Please call ahead and tell them that you were seen by telemedicine and they have recommended that you have a face to face evaluation for Corona virus.   Ruth [name], per our conversation you have indicated that you DO have chest pani and shortness of breath, therefore a face to face to evaluate is indicated.   . [facility] Emergency Department [place], [place], [place] [address] [phone]  . [facility] Emergency Department [place], [place], [place] [address] [phone]  . [facility] Emergency Department [place], [place], [place] [address] [phone]  . [facility] Emergency Department [address] [place], [place], [place] [address] [phone]  . [facility] Emergency Department [place], [place], [place] [address] [phone]  NOTE: If you entered your credit card information for this eVisit, you will not be charged. You may see a "hold" on your card for the $35 but that hold will drop off and you will not have a charge processed.   Your e-visit answers were reviewed by a board certified advanced clinical practitioner to complete your personal care plan.  Thank you for using e-Visits.  I spent 7 min in completion of this note- SO

## 2019-01-14 ENCOUNTER — Other Ambulatory Visit: Payer: Self-pay

## 2019-01-14 ENCOUNTER — Ambulatory Visit: Payer: BLUE CROSS/BLUE SHIELD | Admitting: Family Medicine

## 2019-01-14 ENCOUNTER — Telehealth (INDEPENDENT_AMBULATORY_CARE_PROVIDER_SITE_OTHER): Payer: BLUE CROSS/BLUE SHIELD | Admitting: Family Medicine

## 2019-01-14 DIAGNOSIS — F5104 Psychophysiologic insomnia: Secondary | ICD-10-CM | POA: Diagnosis not present

## 2019-01-14 DIAGNOSIS — B349 Viral infection, unspecified: Secondary | ICD-10-CM

## 2019-01-14 NOTE — Progress Notes (Signed)
RFV- Anxiety- Patient stated she needs a refill on alprazolam 0.5 mg for sleep/anxiety. Patient is doing good on this medication. Not having any other issus at this time.

## 2019-01-14 NOTE — Patient Instructions (Addendum)
I will check into sleep psychologist and get you that information.  Sleep study may also be beneficial. Continue alprazolam for now.  Close follow-up in the next 6 weeks, depending on restrictions at that time could potentially do another telemedicine visit.  Let me know if there are questions prior.  Stay safe.    If you have lab work done today you will be contacted with your lab results within the next 2 weeks.  If you have not heard from [place] then please contact us. The fastest way to get your results is to register for My Chart.   IF you received an x-ray today, you will receive an invoice from [facility]. Please contact [facility] at [phone] with questions or concerns regarding your invoice.   IF you received labwork today, you will receive an invoice from [facility]. Please contact [facility] at [phone] with questions or concerns regarding your invoice.   Our billing staff will not be able to assist you with questions regarding bills from these companies.  You will be contacted with the lab results as soon as they are available. The fastest way to get your results is to activate your My Chart account. Instructions are located on the last page of this paperwork. If you have not heard from [place] regarding the results in 2 weeks, please contact this office.

## 2019-01-14 NOTE — Progress Notes (Signed)
Virtual Visit via Telephone Note  I connected with Ruth [name] on 01/14/19 at 11:13 AM by telephone and verified that I am speaking with the correct person using two identifiers.   I discussed the limitations, risks, security and privacy concerns of performing an evaluation and management service by telephone and the availability of in person appointments. I also discussed with the patient that there may be a patient responsible charge related to this service. The patient expressed understanding and agreed to proceed, consent obtained  Chief complaint: med refill, anxiety.    History of Present Illness:  Anxiety/insomnia: Last seen February 14.  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  Continued alprazolam at that time.  Thought to have component of POTS but improved symptoms and was not waking up at night, with less tachycardia symptoms.  Stress testing February 18, good response on exercise treadmill test without limitations or arrhythmia.  Released to exercise.  Office visit with [facility] neuro for sleep assessment March 4.  Recommended melatonin 3 to 5 mg 1 to 2 hours prior to projected bedtime, bright light in the morning.  Possible sleep phase delay disorder.  Thought that underlying anxiety may have implications for sleep disorder as well, plan for possible OSA testing. Epworth sleepiness score is 0 out of 24 today, fatigue score is 42 out of 63. Has not has testing since recent illness, also checking into charges, as recent cardiology procedures.  Has tried melatonin in past - vivid dreams, so not taking.  Taking alprazolam nightly. Sleeping well at that dose.  Not feeling anxious during the day. Does feel sleepy during the day.  Does agree to meet with sleep psychologist.   Alprazolam 0.5 mg #30 with 1 refill on March 6th  Evaluated 11 days ago in the ER, suspected viral URI with cough, possible influenza, but symptoms were present for 1 week, testing was deferred.  COVID-19  testing was not performed as no known direct contact with COVID-19 at that time. Improved, no more fevers. Cough improving every day.     Patient Active Problem List   Diagnosis Date Noted  . Chest pain 12/02/2018  . Tachycardia 12/02/2018  . Chronic neck pain 12/29/2016  . Chronic pain syndrome 12/29/2016  . Nonallopathic lesion of cervical region 12/29/2016  . Nonallopathic lesion of thoracic region 12/29/2016  . Nonallopathic lesion of lumbosacral region 12/29/2016  . 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 HYPOTENSION 07/23/2007  . ACNE, MILD 07/23/2007   Past Medical History:  Diagnosis Date  . Allergy   . Arthritis   . Frequency-urgency sydrome   . GERD (gastroesophageal reflux disease)    no meds  . PONV (postoperative nausea and vomiting)    Past Surgical History:  Procedure Laterality Date  . LAPAROSCOPY  04/19/2012   Procedure: LAPAROSCOPY DIAGNOSTIC;  Surgeon: Selinda Orion, MD;  Location: [facility];  Service: Gynecology;  Laterality: N/A;  left ovarian cystectomy with excision of BENIGN l cystic 7.6cm left teratoma WITH ENDO CATCH REMOVAL and cauterization of endometriosis  . nasal septoplasty  '11  . WISDOM TOOTH EXTRACTION     Allergies  Allergen Reactions  . Lactose Intolerance (Gi) Hives    ANY MILK  BASED PROTEIN  . Whey Protein [Protein] Hives   Prior to Admission medications   Medication Sig Start Date End Date Taking? Authorizing Provider  ALPRAZolam [name]) 0.5 MG tablet Take 1 tablet (0.5 mg total) by mouth at bedtime as needed for anxiety. 12/24/18  Yes [name]  R, MD   Social History   Socioeconomic History  . Marital status: Single    Spouse name: Not on file  . Number of children: Not on file  . Years of education: Not on file  . Highest education level: Not on file  Occupational History  . Not on file  Social Needs  . Financial resource strain: Not on file  . Food insecurity:    Worry: Not on file    Inability:  Not on file  . Transportation needs:    Medical: Not on file    Non-medical: Not on file  Tobacco Use  . Smoking status: Former Smoker    Packs/day: 1.00    Last attempt to quit: 04/12/2004    Years since quitting: 14.7  . Smokeless tobacco: Never Used  Substance and Sexual Activity  . Alcohol use: Yes    Comment: social  . Drug use: No  . Sexual activity: Yes  Lifestyle  . Physical activity:    Days per week: Not on file    Minutes per session: Not on file  . Stress: Not on file  Relationships  . Social connections:    Talks on phone: Not on file    Gets together: Not on file    Attends religious service: Not on file    Active member of club or organization: Not on file    Attends meetings of clubs or organizations: Not on file    Relationship status: Not on file  . Intimate partner violence:    Fear of current or ex partner: Not on file    Emotionally abused: Not on file    Physically abused: Not on file    Forced sexual activity: Not on file  Other Topics Concern  . Not on file  Social History Narrative   Lives with partner.       Observations/Objective: No respiratory distress, normal speech, normal interaction on phone.  Assessment and Plan: Psychophysiologic insomnia  -Notes reviewed as above, symptomatically she does feel improved on the alprazolam, but still with some daytime fatigue and concerned she may not be truly getting restful sleep.  Testing may be cost prohibitive at this point with sleep study, but she will be looking into this further.  Does agree to meet with sleep psychologist, so I will obtain some names and send this to her.  We will continue alprazolam same dose for now, recently had refill on March 6 with 1 additional refill.  Will hold on daytime medication as denies any depression or anxiety symptoms during the day at this time.  Symptomatically recovering from recent illness.  RTC precautions given.  Follow Up Instructions: 6-week  follow-up.   I discussed the assessment and treatment plan with the patient. The patient was provided an opportunity to ask questions and all were answered. The patient agreed with the plan and demonstrated an understanding of the instructions.   The patient was advised to call back or seek an in-person evaluation if the symptoms worsen or if the condition fails to improve as anticipated.  I provided 15 minutes of non-face-to-face time during this encounter.  Signed,   [name], MD [facility] [place] [facility].  01/14/19

## 2019-01-18 ENCOUNTER — Encounter: Payer: Self-pay | Admitting: Family Medicine

## 2019-03-23 ENCOUNTER — Telehealth (INDEPENDENT_AMBULATORY_CARE_PROVIDER_SITE_OTHER): Payer: BC Managed Care – PPO | Admitting: Family Medicine

## 2019-03-23 DIAGNOSIS — F5104 Psychophysiologic insomnia: Secondary | ICD-10-CM | POA: Diagnosis not present

## 2019-03-23 MED ORDER — ALPRAZOLAM 0.5 MG PO TABS: 0.5000 mg | ORAL_TABLET | Freq: Every evening | ORAL | 1 refills | Status: DC | PRN

## 2019-03-23 NOTE — Progress Notes (Signed)
Virtual Visit via Telephone Note  I connected with Ruth [name] on 03/23/19 at 2:38 PM by telephone and verified that I am speaking with the correct person using two identifiers.   I discussed the limitations, risks, security and privacy concerns of performing an evaluation and management service by telephone and the availability of in person appointments. I also discussed with the patient that there may be a patient responsible charge related to this service. The patient expressed understanding and agreed to proceed, consent obtained  Chief complaint:  Insomnia  History of Present Illness: Ruth [name] is a 38 y.o. female  Psychophysiologic insomnia; See prior visits.  Tele-med visit March 27.  Previous eval by sleep specialist.  Recommended melatonin and sleep psychologist.  Possible underlying anxiety with sleep phase delay disorder.  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  Denied daytime anxiety but did  have some daytime sleepiness. Names were provided for sleep psychologist after last visit.    Has been doing well overall. Has decreased caffeine in day, fluids cut back later in day. Has been able to stay asleep for 6 hrs at a time - less night waking.  Noise app for bedtime - less night waking.  Tried avoiding alprazolam, but still difficulty with sleep onset. Has about 10 left.  Still some coverage issues possible for sleep study.  Did schedule telemed visit with sleep psychologist in 5 days.   Controlled substance database (PDMP) reviewed. No concerns appreciated. Last alprazolam refill 02/18/19 for #30. 0.5mg .   GAD 7 : Generalized Anxiety Score 12/03/2018  Nervous, Anxious, on Edge 0  Control/stop worrying 1  Worry too much - different things 2  Trouble  relaxing 2  Restless 0  Easily annoyed or irritable 0  Afraid - awful might happen 2  Total GAD 7 Score 7  Anxiety Difficulty Somewhat difficult    Depression screen [facility] 2/9 03/23/2019 01/14/2019 12/03/2018 10/22/2018 10/22/2018  Decreased Interest 0 0 0 0 0  Down, Depressed, Hopeless 0 0 0 0 0  PHQ - 2 Score 0 0 0 0 0  Altered sleeping - 0 3 - -  Tired, decreased energy - 0 3 - -  Change in appetite - 0 0 - -  Feeling bad or failure about yourself  - 0 0 - -  Trouble concentrating - 0 1 - -  Moving slowly or fidgety/restless - 0 0 - -  Suicidal thoughts - 0 0 - -  PHQ-9 Score - 0 7 - -  Difficult doing work/chores - Not difficult at all Not difficult at all - -      Patient Active Problem List   Diagnosis Date Noted  . Chest pain 12/02/2018  . Tachycardia 12/02/2018  . Chronic neck pain 12/29/2016  . Chronic pain syndrome 12/29/2016  . Nonallopathic lesion of cervical region 12/29/2016  . Nonallopathic lesion of thoracic region 12/29/2016  . Nonallopathic lesion of lumbosacral region 12/29/2016  . 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 HYPOTENSION 07/23/2007  . ACNE, MILD 07/23/2007   Past Medical History:  Diagnosis Date  . Allergy   . Arthritis   . Frequency-urgency sydrome   . GERD (gastroesophageal reflux disease)    no meds  . PONV (postoperative nausea and vomiting)    Past Surgical History:  Procedure Laterality Date  . LAPAROSCOPY  04/19/2012   Procedure: LAPAROSCOPY DIAGNOSTIC;  Surgeon: Selinda Orion, MD;  Location: [facility];  Service: Gynecology;  Laterality: N/A;  left ovarian cystectomy with excision of BENIGN l cystic 7.6cm left teratoma WITH ENDO CATCH REMOVAL and cauterization of endometriosis  . nasal septoplasty  '11  . WISDOM TOOTH EXTRACTION     Allergies  Allergen Reactions  . Lactose Intolerance (Gi) Hives    ANY MILK  BASED PROTEIN  . Whey Protein [Protein] Hives   Prior to Admission medications   Medication Sig Start Date  End Date Taking? Authorizing Provider  ALPRAZolam [name]) 0.5 MG tablet Take 1 tablet (0.5 mg total) by mouth at bedtime as needed for anxiety. 12/24/18  Yes Wendie Agreste, MD   Social History   Socioeconomic History  . Marital status: Single    Spouse name: Not on file  . Number of children: Not on file  . Years of education: Not on file  . Highest education level: Not on file  Occupational History  . Not on file  Social Needs  . Financial resource strain: Not on file  . Food insecurity:    Worry: Not on file    Inability: Not on file  . Transportation needs:    Medical: Not on file    Non-medical: Not on file  Tobacco Use  . Smoking status: Former Smoker    Packs/day: 1.00    Last attempt to quit: 04/12/2004    Years since quitting: 14.9  . Smokeless tobacco: Never Used  Substance and Sexual Activity  . Alcohol use: Yes    Comment: social  . Drug use: No  . Sexual activity: Yes  Lifestyle  . Physical activity:    Days per week: Not on file    Minutes per session: Not on file  . Stress: Not on file  Relationships  . Social connections:    Talks on phone: Not on file    Gets together: Not on file    Attends religious service: Not on file    Active member of club or organization: Not on file    Attends meetings of clubs or organizations: Not on file    Relationship status: Not on file  . Intimate partner violence:    Fear of current or ex partner: Not on file    Emotionally abused: Not on file    Physically abused: Not on file    Forced sexual activity: Not on file  Other Topics Concern  . Not on file  Social History Narrative   Lives with partner.       Observations/Objective: Euthymic mood with discussion, appropriate responses, no distress.  All questions were answered with understanding expressed of plan.   Assessment and Plan: Psychophysiological insomnia - Plan: ALPRAZolam (XANAX) 0.5 MG tablet  -Improved with less sleep wakening.  Still may benefit  from sleep study but will be coordinated with sleep specialist due to coverage issues.  I also anticipate some improvement after meeting with sleep psychologist.  Continue alprazolam for now with follow-up in the next few months.  Follow Up Instructions: 2 months.    I discussed the assessment and treatment plan with the patient. The patient was provided an opportunity to ask questions and all were answered. The patient agreed with the plan and demonstrated an understanding of the instructions.   The patient was advised to call back or seek an in-person evaluation if the symptoms worsen  or if the condition fails to improve as anticipated.  I provided 7 minutes of non-face-to-face time during this encounter.  Signed,   [name], MD [facility] [place].  03/23/19

## 2019-03-23 NOTE — Progress Notes (Signed)
CC-Medication refill- (Anxiety) patient states she is doing fine.GAD7=2 Today. She want to speak to you about future plan since have not been able to go to appts she had scheduled and would like to talk about the future plans. Looking back on a previous message you recommended [facility] counseling or  [facility]. Last office notes you stated you will check into sleep psychologist. So Im think these are the appt she is unable to get to due this pandemic.

## 2019-03-23 NOTE — Patient Instructions (Addendum)
  I am glad to hear that the night awakening has improved with the change in caffeine and fluids, and other home treatments.  Okay to continue alprazolam at bedtime for now, but I am interested to see any progress with meeting with the sleep psychologist.  Give me an update after that visit and let me know once you have figured out about the sleep study.  Follow-up with me in the next few months, sooner if needed.  Take care.   Return to the clinic or go to the nearest emergency room if any of your symptoms worsen or new symptoms occur.  If you have lab work done today you will be contacted with your lab results within the next 2 weeks.  If you have not heard from [place] then please contact us. The fastest way to get your results is to register for My Chart.   IF you received an x-ray today, you will receive an invoice from [facility]. Please contact [facility] at [phone] with questions or concerns regarding your invoice.   IF you received labwork today, you will receive an invoice from [facility]. Please contact [facility] at [phone] with questions or concerns regarding your invoice.   Our billing staff will not be able to assist you with questions regarding bills from these companies.  You will be contacted with the lab results as soon as they are available. The fastest way to get your results is to activate your My Chart account. Instructions are located on the last page of this paperwork. If you have not heard from [place] regarding the results in 2 weeks, please contact this office.

## 2019-04-04 ENCOUNTER — Telehealth: Payer: Self-pay

## 2019-04-04 NOTE — Telephone Encounter (Signed)
We have attempted to call the patient two times to schedule sleep study.  Patient has been unavailable at the phone numbers we have on file and has not returned our calls. On 6/2: pt states she needs to talk with her insurance company first before rescheduling.  When the patient calls back we will schedule them for their sleep study.

## 2019-06-08 ENCOUNTER — Encounter: Payer: Self-pay | Admitting: *Deleted

## 2019-06-27 NOTE — Progress Notes (Signed)
[name] [address] [place], [place] [address] Phone: [phone] Subjective:   Ruth [name], am serving as a scribe for Dr. Hulan Saas.  I'm seeing this patient by the request  of:    CC: Neck pain follow-up  [id number]  Ruth [name] is a 38 y.o. female coming in with complaint of neck pain. Last seen in 2018 for OMT. Patient states that she has been having pain in front of her neck. Has history of costochondritis.Marland Kitchen Has had headaches 27/30 days last month. Is trying to do posture exercises but they are aggravating her pain. Intermittent tingling into the left arm. Feels tension in right arm.       Past Medical History:  Diagnosis Date  . Allergy   . Arthritis   . Frequency-urgency sydrome   . GERD (gastroesophageal reflux disease)    [name] meds  . PONV (postoperative nausea and vomiting)    Past Surgical History:  Procedure Laterality Date  . LAPAROSCOPY  04/19/2012   Procedure: LAPAROSCOPY DIAGNOSTIC;  Surgeon: Selinda Orion, MD;  Location: [facility];  Service: Gynecology;  Laterality: N/A;  left ovarian cystectomy with excision of BENIGN l cystic 7.6cm left teratoma WITH ENDO CATCH REMOVAL and cauterization of endometriosis  . nasal septoplasty  '11  . SKIN BIOPSY Left 09/05/2011   left breast- 0 tmt.   . WISDOM TOOTH EXTRACTION     Social History   Socioeconomic History  . Marital status: Single    Spouse name: Not on file  . Number of children: Not on file  . Years of education: Not on file  . Highest education level: Not on file  Occupational History  . Not on file  Social Needs  . Financial resource strain: Not on file  . Food insecurity    Worry: Not on file    Inability: Not on file  . Transportation needs    Medical: Not on file    Non-medical: Not on file  Tobacco Use  . Smoking status: Former Smoker    Packs/day: 1.00    Quit date: 04/12/2004    Years since quitting: 15.2  . Smokeless  tobacco: Never Used  Substance and Sexual Activity  . Alcohol use: Yes    Comment: social  . Drug use: [name]  . Sexual activity: Yes  Lifestyle  . Physical activity    Days per week: Not on file    Minutes per session: Not on file  . Stress: Not on file  Relationships  . Social Herbalist on phone: Not on file    Gets together: Not on file    Attends religious service: Not on file    Active member of club or organization: Not on file    Attends meetings of clubs or organizations: Not on file    Relationship status: Not on file  Other Topics Concern  . Not on file  Social History Narrative   Lives with partner.     Allergies  Allergen Reactions  . Lactose Intolerance (Gi) Hives    ANY MILK  BASED PROTEIN  . Whey Protein [Protein] Hives   Family History  Problem Relation Age of Onset  . Stroke Father   . Cancer Maternal Grandmother   . Diabetes Maternal Grandmother          Current Outpatient Medications (Other):  Marland Kitchen  ALPRAZolam (XANAX) 0.5 MG tablet, Take 1 tablet (0.5 mg total) by  mouth at bedtime as needed for anxiety.    Past medical history, social, surgical and family history all reviewed in electronic medical record.  [name] pertanent information unless stated regarding to the chief complaint.   Review of Systems:  [name] headache, visual changes, nausea, vomiting, diarrhea, constipation, dizziness, abdominal pain, skin rash, fevers, chills, night sweats, weight loss, swollen lymph nodes, body aches, joint swelling, muscle aches, chest pain, shortness of breath, mood changes.   Objective  Blood pressure 110/70, pulse 83, weight 145 lb (65.8 kg), SpO2 99 %.    General: [name] apparent distress alert and oriented x3 mood and affect normal, dressed appropriately.  HEENT: Pupils equal, extraocular movements intact  Respiratory: Patient's speak in full sentences and does not appear short of breath  Cardiovascular: [name] lower extremity edema, non tender, [name] erythema   Skin: Warm dry intact with [name] signs of infection or rash on extremities or on axial skeleton.  Abdomen: Soft nontender  Neuro: Cranial nerves II through XII are intact, neurovascularly intact in all extremities with 2+ DTRs and 2+ pulses.  Lymph: [name] lymphadenopathy of posterior or anterior cervical chain or axillae bilaterally.  Gait normal with good balance and coordination.  MSK:  Non tender with full range of motion and good stability and symmetric strength and tone of shoulders, elbows, wrist, hip, knee and ankles bilaterally.  Neck: Inspection unremarkable.  Mild increasing kyphosis of the upper thoracic spine [name] palpable stepoffs. Negative Spurling's maneuver. Full neck range of motion Grip strength and sensation normal in bilateral hands Strength good C4 to T1 distribution [name] sensory change to C4 to T1 Negative Hoffman sign bilaterally Reflexes normal Tightness of the left trapezius noted.  Osteopathic findings  C4 flexed rotated and side bent left T8 extended rotated and side bent left L2 flexed rotated and side bent right Sacrum right on right    Impression and Recommendations:     This case required medical decision making of moderate complexity. The above documentation has been reviewed and is accurate and complete Lyndal Pulley, DO       Note: This dictation was prepared with Dragon dictation along with smaller phrase technology. Any transcriptional errors that result from this process are unintentional.

## 2019-06-28 ENCOUNTER — Ambulatory Visit (INDEPENDENT_AMBULATORY_CARE_PROVIDER_SITE_OTHER): Payer: BC Managed Care – PPO | Admitting: Family Medicine

## 2019-06-28 ENCOUNTER — Other Ambulatory Visit: Payer: Self-pay

## 2019-06-28 ENCOUNTER — Encounter: Payer: Self-pay | Admitting: Family Medicine

## 2019-06-28 VITALS — BP 110/70 | HR 83 | Wt 145.0 lb

## 2019-06-28 DIAGNOSIS — G8929 Other chronic pain: Secondary | ICD-10-CM

## 2019-06-28 DIAGNOSIS — M542 Cervicalgia: Secondary | ICD-10-CM

## 2019-06-28 DIAGNOSIS — M999 Biomechanical lesion, unspecified: Secondary | ICD-10-CM | POA: Diagnosis not present

## 2019-06-28 NOTE — Patient Instructions (Addendum)
Exercises 3x a day See me again in 4 weeks

## 2019-06-28 NOTE — Assessment & Plan Note (Signed)
Chronic neck pain that been sometime since we have seen him.  Some exacerbation.  Scapular dyskinesis given.  Discussed icing regimen and home exercises, which activities to do which wants to avoid.  Discussed the ergonomics with patient working more at home.  Follow-up with me again in 4 to 8 weeks

## 2019-06-28 NOTE — Assessment & Plan Note (Signed)
Decision today to treat with OMT was based on Physical Exam  After verbal consent patient was treated with HVLA, ME, FPR techniques in cervical, thoracic, lumbar and sacral areas  Patient tolerated the procedure well with improvement in symptoms  Patient given exercises, stretches and lifestyle modifications  See medications in patient instructions if given  Patient will follow up in 4-8 weeks 

## 2019-06-30 ENCOUNTER — Other Ambulatory Visit: Payer: Self-pay

## 2019-06-30 ENCOUNTER — Telehealth (INDEPENDENT_AMBULATORY_CARE_PROVIDER_SITE_OTHER): Payer: BC Managed Care – PPO | Admitting: Family Medicine

## 2019-06-30 DIAGNOSIS — F5104 Psychophysiologic insomnia: Secondary | ICD-10-CM | POA: Diagnosis not present

## 2019-06-30 MED ORDER — ALPRAZOLAM 0.5 MG PO TABS: 0.5000 mg | ORAL_TABLET | Freq: Every evening | ORAL | 1 refills | Status: DC | PRN

## 2019-06-30 NOTE — Progress Notes (Signed)
CC- medication refill- Need a refill on xanax. Not having any issus on this medication. GAD7=4

## 2019-06-30 NOTE — Patient Instructions (Signed)
No change in medications for now.  Continue to work with the therapist to work on sleep hygiene/phobia. Continue to try to wean to 1/2 pill as that improves. Recheck in 3 months.

## 2019-06-30 NOTE — Progress Notes (Signed)
Virtual Visit via Telephone Note  I connected with Ruth [name] on 06/30/19 at 5:57 PM by telephone and verified that I am speaking with the correct person using two identifiers.   I discussed the limitations, risks, security and privacy concerns of performing an evaluation and management service by telephone and the availability of in person appointments. I also discussed with the patient that there may be a patient responsible charge related to this service. The patient expressed understanding and agreed to proceed, consent obtained  Chief complaint:  Insomnia  History of Present Illness: Ruth [name] is a 38 y.o. female  Insomnia: Psychophysiologic insomnia.  Discussed most recently June 3rd.  Previous eval by sleep specialist, recommended melatonin, sleep psychologist.  At some point planned on possible sleep study, but somewhat cost prohibitive.   Vivid dreams with melatonin in the past.  Improved in June with decrease caffeine in the day, decrease fluids later in the day.  Using noise app for bedtime with less nighttime wakening.  insurance issues with local sleep psychologist.  Did have telehealth = few visits with [facility].  Discussing sleep hygiene, working on fear/phobia of not being able to sleep.  working on CBT toward sleep. Still some though of anxiety part, but not having daytime anxiety symptoms.  Still requiring alprazolam once at bedtime, 0.5 mg.  Has been trying 1/2 pill or not at all, but not successful yet with that approach. 1/2 helps a little but some trouble getting to sleep at that dose.  Plans on continuing to work on weaning meds.   Controlled substance database (PDMP) reviewed. No concerns appreciated. Last filled alprazolam # 30 on 05/04/19.    Depression screen [facility] 2/9 06/30/2019 03/23/2019 01/14/2019 12/03/2018 10/22/2018  Decreased Interest 0 0 0 0 0  Down, Depressed, Hopeless 0 0 0 0 0  PHQ - 2 Score 0 0 0 0 0  Altered sleeping - - 0 3 -  Tired, decreased  energy - - 0 3 -  Change in appetite - - 0 0 -  Feeling bad or failure about yourself  - - 0 0 -  Trouble concentrating - - 0 1 -  Moving slowly or fidgety/restless - - 0 0 -  Suicidal thoughts - - 0 0 -  PHQ-9 Score - - 0 7 -  Difficult doing work/chores - - Not difficult at all Not difficult at all -         Patient Active Problem List   Diagnosis Date Noted  . Chest pain 12/02/2018  . Tachycardia 12/02/2018  . Chronic neck pain 12/29/2016  . Chronic pain syndrome 12/29/2016  . Nonallopathic lesion of cervical region 12/29/2016  . Nonallopathic lesion of thoracic region 12/29/2016  . Nonallopathic lesion of lumbosacral region 12/29/2016  . 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 HYPOTENSION 07/23/2007  . ACNE, MILD 07/23/2007   Past Medical History:  Diagnosis Date  . Allergy   . Arthritis   . Frequency-urgency sydrome   . GERD (gastroesophageal reflux disease)    no meds  . PONV (postoperative nausea and vomiting)    Past Surgical History:  Procedure Laterality Date  . LAPAROSCOPY  04/19/2012   Procedure: LAPAROSCOPY DIAGNOSTIC;  Surgeon: Selinda Orion, MD;  Location: [facility];  Service: Gynecology;  Laterality: N/A;  left ovarian cystectomy with excision of BENIGN l cystic 7.6cm left teratoma WITH ENDO CATCH REMOVAL and cauterization of endometriosis  . nasal septoplasty  '11  . SKIN BIOPSY  Left 09/05/2011   left breast- 0 tmt.   . WISDOM TOOTH EXTRACTION     Allergies  Allergen Reactions  . Lactose Intolerance (Gi) Hives    ANY MILK  BASED PROTEIN  . Whey Protein [Protein] Hives   Prior to Admission medications   Medication Sig Start Date End Date Taking? Authorizing Provider  ALPRAZolam [name]) 0.5 MG tablet Take 1 tablet (0.5 mg total) by mouth at bedtime as needed for anxiety. 03/23/19  Yes Wendie Agreste, MD   Social History   Socioeconomic History  . Marital status: Single    Spouse name: Not on file  . Number of children: Not on  file  . Years of education: Not on file  . Highest education level: Not on file  Occupational History  . Not on file  Social Needs  . Financial resource strain: Not on file  . Food insecurity    Worry: Not on file    Inability: Not on file  . Transportation needs    Medical: Not on file    Non-medical: Not on file  Tobacco Use  . Smoking status: Former Smoker    Packs/day: 1.00    Quit date: 04/12/2004    Years since quitting: 15.2  . Smokeless tobacco: Never Used  Substance and Sexual Activity  . Alcohol use: Yes    Comment: social  . Drug use: No  . Sexual activity: Yes  Lifestyle  . Physical activity    Days per week: Not on file    Minutes per session: Not on file  . Stress: Not on file  Relationships  . Social Herbalist on phone: Not on file    Gets together: Not on file    Attends religious service: Not on file    Active member of club or organization: Not on file    Attends meetings of clubs or organizations: Not on file    Relationship status: Not on file  . Intimate partner violence    Fear of current or ex partner: Not on file    Emotionally abused: Not on file    Physically abused: Not on file    Forced sexual activity: Not on file  Other Topics Concern  . Not on file  Social History Narrative   Lives with partner.     Observations/Objective: There were no vitals filed for this visit. Appropriate responses, all questions answered.  No distress.  Euthymic mood.    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  -Continue alprazolam with continued attempts at weaning to half dose as symptoms improve, recheck 3 months.  Follow Up Instructions:  5months.   Patient Instructions  No change in medications for now.  Continue to work with the therapist to work on sleep hygiene/phobia. Continue to try to  wean to 1/2 pill as that improves. Recheck in 3 months.      I discussed the assessment and treatment plan with the patient. The patient was provided an opportunity to ask questions and all were answered. The patient agreed with the plan and demonstrated an understanding of the instructions.   The patient was advised to call back or seek an in-person evaluation if the symptoms worsen or if the condition fails to improve as anticipated.  I provided 8 minutes of non-face-to-face time during this encounter.  Signed,   [name], MD [facility] at [place].  06/30/19

## 2019-07-05 DIAGNOSIS — R490 Dysphonia: Secondary | ICD-10-CM | POA: Insufficient documentation

## 2019-07-05 DIAGNOSIS — H9312 Tinnitus, left ear: Secondary | ICD-10-CM | POA: Diagnosis not present

## 2019-07-05 DIAGNOSIS — H9202 Otalgia, left ear: Secondary | ICD-10-CM | POA: Diagnosis not present

## 2019-07-06 ENCOUNTER — Ambulatory Visit: Payer: BLUE CROSS/BLUE SHIELD | Admitting: Family Medicine

## 2019-07-26 ENCOUNTER — Ambulatory Visit: Payer: Self-pay | Admitting: Family Medicine

## 2019-08-10 ENCOUNTER — Encounter: Payer: Self-pay | Admitting: Family Medicine

## 2019-08-10 ENCOUNTER — Ambulatory Visit (INDEPENDENT_AMBULATORY_CARE_PROVIDER_SITE_OTHER): Payer: BC Managed Care – PPO | Admitting: Family Medicine

## 2019-08-10 ENCOUNTER — Other Ambulatory Visit: Payer: Self-pay

## 2019-08-10 VITALS — BP 110/68 | HR 92 | Ht 64.5 in | Wt 145.0 lb

## 2019-08-10 DIAGNOSIS — M999 Biomechanical lesion, unspecified: Secondary | ICD-10-CM | POA: Diagnosis not present

## 2019-08-10 DIAGNOSIS — G894 Chronic pain syndrome: Secondary | ICD-10-CM

## 2019-08-10 NOTE — Progress Notes (Signed)
[name] [address] [place], [place] [address] Phone: [phone] Subjective:   [name], am serving as a scribe for Dr. Hulan Saas.   CC: Neck pain follow-up  [id number]  Ruth [name] is a 38 y.o. female coming in with complaint of neck and upper back tightness. Throbbing and radiating symptoms have improved and is having less headaches.  Patient still feels when she tries to increase activity has more discomfort and pain.      Past Medical History:  Diagnosis Date  . Allergy   . Arthritis   . Frequency-urgency sydrome   . GERD (gastroesophageal reflux disease)    no meds  . PONV (postoperative nausea and vomiting)    Past Surgical History:  Procedure Laterality Date  . LAPAROSCOPY  04/19/2012   Procedure: LAPAROSCOPY DIAGNOSTIC;  Surgeon: Selinda Orion, MD;  Location: [facility];  Service: Gynecology;  Laterality: N/A;  left ovarian cystectomy with excision of BENIGN l cystic 7.6cm left teratoma WITH ENDO CATCH REMOVAL and cauterization of endometriosis  . nasal septoplasty  '11  . SKIN BIOPSY Left 09/05/2011   left breast- 0 tmt.   . WISDOM TOOTH EXTRACTION     Social History   Socioeconomic History  . Marital status: Single    Spouse name: Not on file  . Number of children: Not on file  . Years of education: Not on file  . Highest education level: Not on file  Occupational History  . Not on file  Social Needs  . Financial resource strain: Not on file  . Food insecurity    Worry: Not on file    Inability: Not on file  . Transportation needs    Medical: Not on file    Non-medical: Not on file  Tobacco Use  . Smoking status: Former Smoker    Packs/day: 1.00    Quit date: 04/12/2004    Years since quitting: 15.3  . Smokeless tobacco: Never Used  Substance and Sexual Activity  . Alcohol use: Yes    Comment: social  . Drug use: No  . Sexual activity: Yes  Lifestyle  . Physical activity   Days per week: Not on file    Minutes per session: Not on file  . Stress: Not on file  Relationships  . Social Herbalist on phone: Not on file    Gets together: Not on file    Attends religious service: Not on file    Active member of club or organization: Not on file    Attends meetings of clubs or organizations: Not on file    Relationship status: Not on file  Other Topics Concern  . Not on file  Social History Narrative   Lives with partner.     Allergies  Allergen Reactions  . Lactose Intolerance (Gi) Hives    ANY MILK  BASED PROTEIN  . Whey Protein [Protein] Hives   Family History  Problem Relation Age of Onset  . Stroke Father   . Cancer Maternal Grandmother   . Diabetes Maternal Grandmother          Current Outpatient Medications (Other):  Marland Kitchen  ALPRAZolam (XANAX) 0.5 MG tablet, Take 1 tablet (0.5 mg total) by mouth at bedtime as needed for anxiety.    Past medical history, social, surgical and family history all reviewed in electronic medical record.  No pertanent information unless stated regarding to the chief complaint.   Review  of Systems:  No headache, visual changes, nausea, vomiting, diarrhea, constipation, dizziness, abdominal pain, skin rash, fevers, chills, night sweats, weight loss, swollen lymph nodes, body aches, joint swelling, muscle aches, chest pain, shortness of breath, mood changes.   Objective  Blood pressure 110/68, pulse 92, height 5' 4.5" (1.638 m), weight 145 lb (65.8 kg), SpO2 98 %.  General: No apparent distress alert and oriented x3 mood and affect normal, dressed appropriately.  HEENT: Pupils equal, extraocular movements intact  Respiratory: Patient's speak in full sentences and does not appear short of breath  Cardiovascular: No lower extremity edema, non tender, no erythema  Skin: Warm dry intact with no signs of infection or rash on extremities or on axial skeleton.  Abdomen: Soft nontender  Neuro: Cranial nerves II  through XII are intact, neurovascularly intact in all extremities with 2+ DTRs and 2+ pulses.  Lymph: No lymphadenopathy of posterior or anterior cervical chain or axillae bilaterally.  Gait normal with good balance and coordination.  MSK:  Non tender with full range of motion and good stability and symmetric strength and tone of shoulders, elbows, wrist, hip, knee and ankles bilaterally.  Neck exam does have some mild loss of lordosis.  Very mild increase in kyphosis of the upper thoracic spine. Negative Spurling's.  5-5 strength of the upper extremities.  Mild tightness noted in the parascapular region.  Osteopathic findings C2 flexed rotated and side bent right C6 flexed rotated and side bent left L2 flexed rotated and side bent right Sacrum right on right    Impression and Recommendations:     This case required medical decision making of moderate complexity. The above documentation has been reviewed and is accurate and complete Lyndal Pulley, DO       Note: This dictation was prepared with Dragon dictation along with smaller phrase technology. Any transcriptional errors that result from this process are unintentional.

## 2019-08-10 NOTE — Assessment & Plan Note (Signed)
Doing relatively well overall.  Discussed with patient icing regimen and home exercises, discussed which activities of doing which will still avoid.  Patient will increase activity as tolerated.  Discussed icing regimen.  Patient has made good strides.  Discussed with her to increase activity including bleeding exercises.  Follow-up again 4 to 8 weeks

## 2019-08-10 NOTE — Patient Instructions (Signed)
Good to see you  Ruth [name] is your friend Stay active and push it  See me again in 6-7 weeks

## 2019-08-10 NOTE — Assessment & Plan Note (Signed)
Decision today to treat with OMT was based on Physical Exam  After verbal consent patient was treated with HVLA, ME, FPR techniques in cervical, thoracic, lumbar and sacral areas  Patient tolerated the procedure well with improvement in symptoms  Patient given exercises, stretches and lifestyle modifications  See medications in patient instructions if given  Patient will follow up in 4-8 weeks 

## 2019-09-21 ENCOUNTER — Ambulatory Visit: Payer: BC Managed Care – PPO | Admitting: Family Medicine

## 2019-09-21 NOTE — Assessment & Plan Note (Deleted)
Decision today to treat with OMT was based on Physical Exam  After verbal consent patient was treated with HVLA, ME, FPR techniques in cervical, thoracic, lumbar and sacral areas  Patient tolerated the procedure well with improvement in symptoms  Patient given exercises, stretches and lifestyle modifications  See medications in patient instructions if given  Patient will follow up in 4-8 weeks 

## 2019-09-21 NOTE — Progress Notes (Deleted)
[name] [place] [place], [place] [address] Phone: [phone] Subjective:    I'm seeing this patient by the request  of:  Wendie Agreste, MD   This visit occurred during the SARS-CoV-2 public health emergency.  Safety protocols were in place, including screening questions prior to the visit, additional usage of staff PPE, and extensive cleaning of exam room while observing appropriate contact time as indicated for disinfecting solutions.     CC:   [id number]   08/10/2019 OMT  Update 09/21/2019 Ruth [name] is a 38 y.o. female coming in with complaint of ***  Onset-  Location Duration-  Character- Aggravating factors- Reliving factors-  Therapies tried-  Severity-     Past Medical History:  Diagnosis Date  . Allergy   . Arthritis   . Frequency-urgency sydrome   . GERD (gastroesophageal reflux disease)    no meds  . PONV (postoperative nausea and vomiting)    Past Surgical History:  Procedure Laterality Date  . LAPAROSCOPY  04/19/2012   Procedure: LAPAROSCOPY DIAGNOSTIC;  Surgeon: Selinda Orion, MD;  Location: [facility];  Service: Gynecology;  Laterality: N/A;  left ovarian cystectomy with excision of BENIGN l cystic 7.6cm left teratoma WITH ENDO CATCH REMOVAL and cauterization of endometriosis  . nasal septoplasty  '11  . SKIN BIOPSY Left 09/05/2011   left breast- 0 tmt.   . WISDOM TOOTH EXTRACTION     Social History   Socioeconomic History  . Marital status: Single    Spouse name: Not on file  . Number of children: Not on file  . Years of education: Not on file  . Highest education level: Not on file  Occupational History  . Not on file  Social Needs  . Financial resource strain: Not on file  . Food insecurity    Worry: Not on file    Inability: Not on file  . Transportation needs    Medical: Not on file    Non-medical: Not on file  Tobacco Use  . Smoking status: Former Smoker   Packs/day: 1.00    Quit date: 04/12/2004    Years since quitting: 15.4  . Smokeless tobacco: Never Used  Substance and Sexual Activity  . Alcohol use: Yes    Comment: social  . Drug use: No  . Sexual activity: Yes  Lifestyle  . Physical activity    Days per week: Not on file    Minutes per session: Not on file  . Stress: Not on file  Relationships  . Social Herbalist on phone: Not on file    Gets together: Not on file    Attends religious service: Not on file    Active member of club or organization: Not on file    Attends meetings of clubs or organizations: Not on file    Relationship status: Not on file  Other Topics Concern  . Not on file  Social History Narrative   Lives with partner.     Allergies  Allergen Reactions  . Lactose Intolerance (Gi) Hives    ANY MILK  BASED PROTEIN  . Whey Protein [Protein] Hives   Family History  Problem Relation Age of Onset  . Stroke Father   . Cancer Maternal Grandmother   . Diabetes Maternal Grandmother          Current Outpatient Medications (Other):  Marland Kitchen  ALPRAZolam (XANAX) 0.5 MG tablet, Take 1 tablet (0.5 mg total)  by mouth at bedtime as needed for anxiety.    Past medical history, social, surgical and family history all reviewed in electronic medical record.  No pertanent information unless stated regarding to the chief complaint.   Review of Systems:  No headache, visual changes, nausea, vomiting, diarrhea, constipation, dizziness, abdominal pain, skin rash, fevers, chills, night sweats, weight loss, swollen lymph nodes, body aches, joint swelling, muscle aches, chest pain, shortness of breath, mood changes.   Objective  There were no vitals taken for this visit. Systems examined below as of    General: No apparent distress alert and oriented x3 mood and affect normal, dressed appropriately.  HEENT: Pupils equal, extraocular movements intact  Respiratory: Patient's speak in full sentences and does not  appear short of breath  Cardiovascular: No lower extremity edema, non tender, no erythema  Skin: Warm dry intact with no signs of infection or rash on extremities or on axial skeleton.  Abdomen: Soft nontender  Neuro: Cranial nerves II through XII are intact, neurovascularly intact in all extremities with 2+ DTRs and 2+ pulses.  Lymph: No lymphadenopathy of posterior or anterior cervical chain or axillae bilaterally.  Gait normal with good balance and coordination.  MSK:  tender with full range of motion and good stability and symmetric strength and tone of shoulders, elbows, wrist, hip, knee and ankles bilaterally.  Pain is somewhat out of proportion to the amount of palpation Neck: Inspection loss of lordosis. No palpable stepoffs. Negative Spurling's maneuver. Full neck range of motion Grip strength and sensation normal in bilateral hands Strength good C4 to T1 distribution No sensory change to C4 to T1 Negative Hoffman sign bilaterally Reflexes normal Tightness in the trapezius bilaterally Tenderness to palpation in the parascapular region right greater than left.  Osteopathic findings  C2 flexed rotated and side bent right C4 flexed rotated and side bent left T9 extended rotated and side bent left L2 flexed rotated and side bent right Sacrum right on right    Impression and Recommendations:     This case required medical decision making of moderate complexity. The above documentation has been reviewed and is accurate and complete Lyndal Pulley, DO       Note: This dictation was prepared with Dragon dictation along with smaller phrase technology. Any transcriptional errors that result from this process are unintentional.

## 2019-09-21 NOTE — Assessment & Plan Note (Deleted)
Multifactorial.  Has been nearly 10 weeks since we have seen patient.  Discussed which activities to do which was to avoid.  Discussed increasing activity slowly.  Patient is to increase this and follow-up with me again in 4 to 8 weeks

## 2019-10-03 ENCOUNTER — Telehealth (INDEPENDENT_AMBULATORY_CARE_PROVIDER_SITE_OTHER): Payer: BC Managed Care – PPO | Admitting: Family Medicine

## 2019-10-03 ENCOUNTER — Other Ambulatory Visit: Payer: Self-pay

## 2019-10-03 DIAGNOSIS — F5104 Psychophysiologic insomnia: Secondary | ICD-10-CM | POA: Diagnosis not present

## 2019-10-03 MED ORDER — ALPRAZOLAM 0.5 MG PO TABS: 0.5000 mg | ORAL_TABLET | Freq: Every evening | ORAL | 2 refills | Status: DC | PRN

## 2019-10-03 NOTE — Progress Notes (Signed)
CC- Med refill- Patient is doing well with no issues at this time. PHQ9=2 GAD7=2

## 2019-10-03 NOTE — Progress Notes (Signed)
Virtual Visit via Telephone Note  I connected with Ruth [name] on 10/03/19 at 3:22 PM by telephone and verified that I am speaking with the correct person using two identifiers.   I discussed the limitations, risks, security and privacy concerns of performing an evaluation and management service by telephone and the availability of in person appointments. I also discussed with the patient that there may be a patient responsible charge related to this service. The patient expressed understanding and agreed to proceed, consent obtained  Chief complaint:  Insomnia.  History of Present Illness: Ruth [name] is a 38 y.o. female  Insomnia: Psychophysiologic insomnia.  See previous notes, last visit September 10.  Prior sleep specialist eval, melatonin, sleep psychology recommended.  Possible sleep study at some point but cost prohibitive.  Vivid dreams with melatonin.  Decrease caffeine as well as decreasing fluids later in the day has been helpful.  Noise app for bedtime with less nighttime waking. In September had met with telehealth, [facility] for few visits.  Working on sleep hygiene, fear/phobia of not being able to sleep and CBT.  Denied daytime anxiety. Still requiring 0.5 mg alprazolam at bedtime last visit.  Controlled substance database (PDMP) reviewed. No concerns appreciated.  Last filled for #30 on November 3, previously #30 on September 10, previously #30 on July 15.  Has been doing well through the pandemic.  Has continued therapy with [facility] - telemed with therapist. Still treating sleep phobia/anxiety.   Has been trying to wean as able.  Staying asleep has been going well. Getting to sleep has been the issue.  gym exercise limited with pandemic.  Tapering med as able - about 5-6 days per week.  Considering sleep study  Not taking extra pills, denies addiction or prescriptions elsewhere.   Myofascial manipulation for neck issues - under care of Dr. Hulan Saas - has been  treating with OMT.  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 This can also interfere with sleep at night. nreve medication discussed, but did not want to take.  OMT has been helpful some for tinnitus as well.    Patient Active Problem List   Diagnosis Date Noted  . Chest pain 12/02/2018  . Tachycardia 12/02/2018  . Chronic neck pain 12/29/2016  . Chronic pain syndrome 12/29/2016  . Nonallopathic lesion of cervical region 12/29/2016  . Nonallopathic lesion of thoracic region 12/29/2016  . Nonallopathic lesion of lumbosacral region 12/29/2016  . 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 HYPOTENSION 07/23/2007  . ACNE, MILD 07/23/2007   Past Medical History:  Diagnosis Date  . Allergy   . Arthritis   . Frequency-urgency sydrome   . GERD (gastroesophageal reflux disease)    no meds  . PONV (postoperative nausea and vomiting)    Past Surgical History:  Procedure Laterality Date  . LAPAROSCOPY  04/19/2012   Procedure: LAPAROSCOPY DIAGNOSTIC;  Surgeon: Selinda Orion, MD;  Location: [facility];  Service: Gynecology;  Laterality: N/A;  left ovarian cystectomy with excision of BENIGN l cystic 7.6cm left teratoma WITH ENDO CATCH REMOVAL and cauterization of endometriosis  . nasal septoplasty  '11  . SKIN BIOPSY Left 09/05/2011   left breast- 0 tmt.   . WISDOM TOOTH EXTRACTION     Allergies  Allergen Reactions  . Lactose Intolerance (Gi) Hives    ANY MILK  BASED PROTEIN  . Whey Protein [Protein] Hives   Prior to Admission medications   Medication Sig Start Date End  Date Taking? Authorizing Provider  ALPRAZolam [name]) 0.5 MG tablet Take 1 tablet (0.5 mg total) by mouth at bedtime as needed for anxiety. 06/30/19  Yes Wendie Agreste, MD   Social History   Socioeconomic History  . Marital status: Single    Spouse name: Not on file  . Number of children: Not on file  . Years of education: Not on file  . Highest education level: Not on file  Occupational  History  . Not on file  Tobacco Use  . Smoking status: Former Smoker    Packs/day: 1.00    Quit date: 04/12/2004    Years since quitting: 15.4  . Smokeless tobacco: Never Used  Substance and Sexual Activity  . Alcohol use: Yes    Comment: social  . Drug use: No  . Sexual activity: Yes  Other Topics Concern  . Not on file  Social History Narrative   Lives with partner.     Social Determinants of Health   Financial Resource Strain:   . Difficulty of Paying Living Expenses: Not on file  Food Insecurity:   . Worried About Charity fundraiser in the Last Year: Not on file  . Ran Out of Food in the Last Year: Not on file  Transportation Needs:   . Lack of Transportation (Medical): Not on file  . Lack of Transportation (Non-Medical): Not on file  Physical Activity:   . Days of Exercise per Week: Not on file  . Minutes of Exercise per Session: Not on file  Stress:   . Feeling of Stress : Not on file  Social Connections:   . Frequency of Communication with Friends and Family: Not on file  . Frequency of Social Gatherings with Friends and Family: Not on file  . Attends Religious Services: Not on file  . Active Member of Clubs or Organizations: Not on file  . Attends Archivist Meetings: Not on file  . Marital Status: Not on file  Intimate Partner Violence:   . Fear of Current or Ex-Partner: Not on file  . Emotionally Abused: Not on file  . Physically Abused: Not on file  . Sexually Abused: Not on file     Observations/Objective: Speaking normally with appropriate responses. euythymic mood.  All questions answered.   Assessment and Plan: Psychophysiological insomnia - Plan: ALPRAZolam (XANAX) 0.5 MG tablet  -Possible component of neck pain/myofascial pain treated by Dr. Tamala Julian, as well as psychophysiological insomnia.  Under care of therapist, continuing work on sleep phobias.  -Continue to try to wean medication as tolerated, refilled alprazolam - sufficient for  at least 3 months with recheck in 3 months.  RTC precautions/follow-up telemedicine visit if any change in symptoms, new side effects or increased need for medication.  -Considering sleep study when insurance changes in the new year.  Follow Up Instructions: 3 months.    I discussed the assessment and treatment plan with the patient. The patient was provided an opportunity to ask questions and all were answered. The patient agreed with the plan and demonstrated an understanding of the instructions.   The patient was advised to call back or seek an in-person evaluation if the symptoms worsen or if the condition fails to improve as anticipated.  I provided 10 minutes of non-face-to-face time during this encounter.  Signed,   [name], MD [facility].  10/03/19

## 2019-11-10 DIAGNOSIS — D229 Melanocytic nevi, unspecified: Secondary | ICD-10-CM | POA: Diagnosis not present

## 2019-11-10 DIAGNOSIS — L659 Nonscarring hair loss, unspecified: Secondary | ICD-10-CM | POA: Diagnosis not present

## 2019-11-16 ENCOUNTER — Ambulatory Visit: Payer: BC Managed Care – PPO | Admitting: Family Medicine

## 2019-11-16 ENCOUNTER — Encounter: Payer: Self-pay | Admitting: Family Medicine

## 2019-11-16 ENCOUNTER — Other Ambulatory Visit: Payer: Self-pay

## 2019-11-16 ENCOUNTER — Ambulatory Visit (INDEPENDENT_AMBULATORY_CARE_PROVIDER_SITE_OTHER): Payer: BC Managed Care – PPO

## 2019-11-16 VITALS — BP 102/72 | HR 96 | Ht 64.0 in | Wt 145.0 lb

## 2019-11-16 DIAGNOSIS — M542 Cervicalgia: Secondary | ICD-10-CM

## 2019-11-16 DIAGNOSIS — M25512 Pain in left shoulder: Secondary | ICD-10-CM

## 2019-11-16 DIAGNOSIS — G8929 Other chronic pain: Secondary | ICD-10-CM | POA: Diagnosis not present

## 2019-11-16 DIAGNOSIS — M999 Biomechanical lesion, unspecified: Secondary | ICD-10-CM

## 2019-11-16 NOTE — Progress Notes (Signed)
Ruth [name] [place] Phone: [phone] Subjective:   Ruth [name], am serving as a scribe for Dr. Hulan Saas. This visit occurred during the SARS-CoV-2 public health emergency.  Safety protocols were in place, including screening questions prior to the visit, additional usage of staff PPE, and extensive cleaning of exam room while observing appropriate contact time as indicated for disinfecting solutions.   I'm seeing this patient by the request  of:  Wendie Agreste, MD  CC: Back pain, neck pain, shoulder pain  [id number]  Ruth [name] is a 39 y.o. female coming in with complaint of neck and back pain Last seen on 08/10/2019 for OMT. Patient states that she has been having an increase in left shoulder pain when patient was removing her jacket. Pain is now in the pec, bicep and tricep. Feels unstable in her shoulder. Has been able to continue to workout despite pain. Has moments of sharp pain.     Past Medical History:  Diagnosis Date  . Allergy   . Arthritis   . Frequency-urgency sydrome   . GERD (gastroesophageal reflux disease)    [name] meds  . PONV (postoperative nausea and vomiting)    Past Surgical History:  Procedure Laterality Date  . LAPAROSCOPY  04/19/2012   Procedure: LAPAROSCOPY DIAGNOSTIC;  Surgeon: Selinda Orion, MD;  Location: [facility];  Service: Gynecology;  Laterality: N/A;  left ovarian cystectomy with excision of BENIGN l cystic 7.6cm left teratoma WITH ENDO CATCH REMOVAL and cauterization of endometriosis  . nasal septoplasty  '11  . SKIN BIOPSY Left 09/05/2011   left breast- 0 tmt.   . WISDOM TOOTH EXTRACTION     Social History   Socioeconomic History  . Marital status: Single    Spouse name: Not on file  . Number of children: Not on file  . Years of education: Not on file  . Highest education level: Not on file  Occupational History  . Not on file  Tobacco Use    . Smoking status: Former Smoker    Packs/day: 1.00    Quit date: 04/12/2004    Years since quitting: 15.6  . Smokeless tobacco: Never Used  Substance and Sexual Activity  . Alcohol use: Yes    Comment: social  . Drug use: [name]  . Sexual activity: Yes  Other Topics Concern  . Not on file  Social History Narrative   Lives with partner.     Social Determinants of Health   Financial Resource Strain:   . Difficulty of Paying Living Expenses: Not on file  Food Insecurity:   . Worried About Charity fundraiser in the Last Year: Not on file  . Ran Out of Food in the Last Year: Not on file  Transportation Needs:   . Lack of Transportation (Medical): Not on file  . Lack of Transportation (Non-Medical): Not on file  Physical Activity:   . Days of Exercise per Week: Not on file  . Minutes of Exercise per Session: Not on file  Stress:   . Feeling of Stress : Not on file  Social Connections:   . Frequency of Communication with Friends and Family: Not on file  . Frequency of Social Gatherings with Friends and Family: Not on file  . Attends Religious Services: Not on file  . Active Member of Clubs or Organizations: Not on file  . Attends Archivist Meetings: Not on file  .  Marital Status: Not on file   Allergies  Allergen Reactions  . Lactose Intolerance (Gi) Hives    ANY MILK  BASED PROTEIN  . Whey Protein [Protein] Hives   Family History  Problem Relation Age of Onset  . Stroke Father   . Cancer Maternal Grandmother   . Diabetes Maternal Grandmother          Current Outpatient Medications (Other):  Marland Kitchen  ALPRAZolam (XANAX) 0.5 MG tablet, Take 1 tablet (0.5 mg total) by mouth at bedtime as needed for anxiety.   Reviewed prior external information including notes and imaging from  primary care provider As well as notes that were available from care everywhere and other healthcare systems.  Past medical history, social, surgical and family history all reviewed in  electronic medical record.  [name] pertanent information unless stated regarding to the chief complaint.   Review of Systems:  [name] headache, visual changes, nausea, vomiting, diarrhea, constipation, dizziness, abdominal pain, skin rash, fevers, chills, night sweats, weight loss, swollen lymph nodes, body aches, joint swelling, chest pain, shortness of breath, mood changes. POSITIVE muscle aches  Objective  Blood pressure 102/72, pulse 96, height 5\' 4"  (1.626 m), weight 145 lb (65.8 kg), SpO2 97 %.   General: [name] apparent distress alert and oriented x3 mood and affect normal, dressed appropriately.  HEENT: Pupils equal, extraocular movements intact  Respiratory: Patient's speak in full sentences and does not appear short of breath  Cardiovascular: [name] lower extremity edema, non tender, [name] erythema  Skin: Warm dry intact with [name] signs of infection or rash on extremities or on axial skeleton.  Abdomen: Soft nontender  Neuro: Cranial nerves II through XII are intact, neurovascularly intact in all extremities with 2+ DTRs and 2+ pulses.  Lymph: [name] lymphadenopathy of posterior or anterior cervical chain or axillae bilaterally.  Gait normal with good balance and coordination.  MSK:  Non tender with full range of motion and good stability and symmetric strength and tone of , elbows, wrist, hip, knee and ankles bilaterally.  Left shoulder exam shows the patient does have near full range of motion.  Possible subtle 5 strength compared to contralateral side.  Patient does have very mild impingement noted [place].  Discussed topical anti-inflammatories proper positioning, which activities to do which was to avoid.  Patient is to increase activity slowly over the course the next several weeks.  Follow-up again in 4 to 8 weeks  Limited musculoskeletal ultrasound was performed and interpreted by Lyndal Pulley  Limited ultrasound shows the patient does have a previous old rotator cuff tear with some mild  degenerative changes but [name] retraction.  Very mild atrophy noted.  [name] significant hypoechoic changes or increasing Doppler flow in the area.  Acromioclavicular joint appears to be normal. Impression: Questionable old rotator cuff tear but [name] retraction  Osteopathic findings  C2 flexed rotated and side bent right T9 extended rotated and side bent left L2 flexed rotated and side bent right Sacrum right on right    Impression and Recommendations:     This case required medical decision making of moderate complexity. The above documentation has been reviewed and is accurate and complete Lyndal Pulley, DO       Note: This dictation was prepared with Dragon dictation along with smaller phrase technology. Any transcriptional errors that result from this process are unintentional.

## 2019-11-16 NOTE — Patient Instructions (Signed)
Flonase in each nostril daily See me again in 6 weeks

## 2019-11-16 NOTE — Assessment & Plan Note (Signed)
Chronic neck pain secondary to more postural imbalances.  Patient does have some mild shoulder irregularity that could be contributing as well.  Change different exercises at this time.  Discussed icing regimen and home exercises.  Patient's problem with the neck is going to be chronic but seems to be stable at this time.  Follow-up with me again in 4 to 8 weeks

## 2019-11-16 NOTE — Assessment & Plan Note (Signed)
Decision today to treat with OMT was based on Physical Exam  After verbal consent patient was treated with HVLA, ME, FPR techniques in cervical, thoracic, lumbar and sacral areas  Patient tolerated the procedure well with improvement in symptoms  Patient given exercises, stretches and lifestyle modifications  See medications in patient instructions if given  Patient will follow up in 4-8 weeks 

## 2019-12-15 ENCOUNTER — Ambulatory Visit: Payer: BC Managed Care – PPO | Admitting: Family Medicine

## 2019-12-15 NOTE — Progress Notes (Deleted)
[place] [address] [place] Phone: [phone] Subjective:    I'm seeing this patient by the request  of:  Wendie Agreste, MD  CC:   [id number]  Ruth [name] is a 39 y.o. female coming in with complaint of ***  Onset-  Location Duration-  Character- Aggravating factors- Reliving factors-  Therapies tried-  Severity-     Past Medical History:  Diagnosis Date  . Allergy   . Arthritis   . Frequency-urgency sydrome   . GERD (gastroesophageal reflux disease)    no meds  . PONV (postoperative nausea and vomiting)    Past Surgical History:  Procedure Laterality Date  . LAPAROSCOPY  04/19/2012   Procedure: LAPAROSCOPY DIAGNOSTIC;  Surgeon: Selinda Orion, MD;  Location: [facility];  Service: Gynecology;  Laterality: N/A;  left ovarian cystectomy with excision of BENIGN l cystic 7.6cm left teratoma WITH ENDO CATCH REMOVAL and cauterization of endometriosis  . nasal septoplasty  '11  . SKIN BIOPSY Left 09/05/2011   left breast- 0 tmt.   . WISDOM TOOTH EXTRACTION     Social History   Socioeconomic History  . Marital status: Single    Spouse name: Not on file  . Number of children: Not on file  . Years of education: Not on file  . Highest education level: Not on file  Occupational History  . Not on file  Tobacco Use  . Smoking status: Former Smoker    Packs/day: 1.00    Quit date: 04/12/2004    Years since quitting: 15.6  . Smokeless tobacco: Never Used  Substance and Sexual Activity  . Alcohol use: Yes    Comment: social  . Drug use: No  . Sexual activity: Yes  Other Topics Concern  . Not on file  Social History Narrative   Lives with partner.     Social Determinants of Health   Financial Resource Strain:   . Difficulty of Paying Living Expenses: Not on file  Food Insecurity:   . Worried About Charity fundraiser in the Last Year: Not on file  . Ran Out of Food in the Last Year:  Not on file  Transportation Needs:   . Lack of Transportation (Medical): Not on file  . Lack of Transportation (Non-Medical): Not on file  Physical Activity:   . Days of Exercise per Week: Not on file  . Minutes of Exercise per Session: Not on file  Stress:   . Feeling of Stress : Not on file  Social Connections:   . Frequency of Communication with Friends and Family: Not on file  . Frequency of Social Gatherings with Friends and Family: Not on file  . Attends Religious Services: Not on file  . Active Member of Clubs or Organizations: Not on file  . Attends Archivist Meetings: Not on file  . Marital Status: Not on file   Allergies  Allergen Reactions  . Lactose Intolerance (Gi) Hives    ANY MILK  BASED PROTEIN  . Whey Protein [Protein] Hives   Family History  Problem Relation Age of Onset  . Stroke Father   . Cancer Maternal Grandmother   . Diabetes Maternal Grandmother          Current Outpatient Medications (Other):  Marland Kitchen  ALPRAZolam (XANAX) 0.5 MG tablet, Take 1 tablet (0.5 mg total) by mouth at bedtime as needed for anxiety.   Reviewed prior external information including notes and  imaging from  primary care provider As well as notes that were available from care everywhere and other healthcare systems.  Past medical history, social, surgical and family history all reviewed in electronic medical record.  No pertanent information unless stated regarding to the chief complaint.   Review of Systems:  No headache, visual changes, nausea, vomiting, diarrhea, constipation, dizziness, abdominal pain, skin rash, fevers, chills, night sweats, weight loss, swollen lymph nodes, body aches, joint swelling, chest pain, shortness of breath, mood changes. POSITIVE muscle aches  Objective  There were no vitals taken for this visit.   General: No apparent distress alert and oriented x3 mood and affect normal, dressed appropriately.  HEENT: Pupils equal, extraocular  movements intact  Respiratory: Patient's speak in full sentences and does not appear short of breath  Cardiovascular: No lower extremity edema, non tender, no erythema  Skin: Warm dry intact with no signs of infection or rash on extremities or on axial skeleton.  Abdomen: Soft nontender  Neuro: Cranial nerves II through XII are intact, neurovascularly intact in all extremities with 2+ DTRs and 2+ pulses.  Lymph: No lymphadenopathy of posterior or anterior cervical chain or axillae bilaterally.  Gait normal with good balance and coordination.  MSK:  Non tender with full range of motion and good stability and symmetric strength and tone of shoulders, elbows, wrist, hip, knee and ankles bilaterally.     Impression and Recommendations:     This case required medical decision making of moderate complexity. The above documentation has been reviewed and is accurate and complete Lyndal Pulley, DO       Note: This dictation was prepared with Dragon dictation along with smaller phrase technology. Any transcriptional errors that result from this process are unintentional.

## 2020-01-05 ENCOUNTER — Ambulatory Visit: Payer: BC Managed Care – PPO | Admitting: Family Medicine

## 2020-01-05 ENCOUNTER — Other Ambulatory Visit: Payer: Self-pay

## 2020-01-05 ENCOUNTER — Ambulatory Visit: Payer: Self-pay

## 2020-01-05 ENCOUNTER — Ambulatory Visit (INDEPENDENT_AMBULATORY_CARE_PROVIDER_SITE_OTHER): Payer: BC Managed Care – PPO

## 2020-01-05 ENCOUNTER — Encounter: Payer: Self-pay | Admitting: Family Medicine

## 2020-01-05 VITALS — BP 106/72 | HR 97 | Ht 64.0 in | Wt 145.0 lb

## 2020-01-05 DIAGNOSIS — M7502 Adhesive capsulitis of left shoulder: Secondary | ICD-10-CM | POA: Diagnosis not present

## 2020-01-05 DIAGNOSIS — M7500 Adhesive capsulitis of unspecified shoulder: Secondary | ICD-10-CM | POA: Insufficient documentation

## 2020-01-05 DIAGNOSIS — G8929 Other chronic pain: Secondary | ICD-10-CM

## 2020-01-05 DIAGNOSIS — M999 Biomechanical lesion, unspecified: Secondary | ICD-10-CM | POA: Diagnosis not present

## 2020-01-05 DIAGNOSIS — M25519 Pain in unspecified shoulder: Secondary | ICD-10-CM | POA: Diagnosis not present

## 2020-01-05 DIAGNOSIS — M25512 Pain in left shoulder: Secondary | ICD-10-CM

## 2020-01-05 DIAGNOSIS — M542 Cervicalgia: Secondary | ICD-10-CM

## 2020-01-05 MED ORDER — GABAPENTIN 100 MG PO CAPS: 200.0000 mg | ORAL_CAPSULE | Freq: Every day | ORAL | 0 refills | Status: DC

## 2020-01-05 NOTE — Patient Instructions (Signed)
[facility] will call you Gabapentin at night Xray today See me again in 5-6 weeks

## 2020-01-05 NOTE — Assessment & Plan Note (Signed)
Patient given injection, tolerated the procedure well, discussed icing regimen and home exercise, which activities to do which wants to avoid.  Patient is to increase activity slowly.  Increase range of motion.  Discussed the possibility of formal physical therapy.  Referral placed.  Follow-up again 4 to 8 weeks

## 2020-01-05 NOTE — Assessment & Plan Note (Deleted)
Patient does have chronic neck pain.  Wound does seem to be a chronic problem and does seem minorly stable but is somewhat concerned with the weakness the patient is getting with the left shoulder girdle.  Patient though does have signs and symptoms consistent with more frozen shoulder as well.  Given injection in the left shoulder and did have improvement in range of motion and strength immediately which is a good sign.  Patient has responded fairly well to osteopathic manipulation.  Started on a low dose of gabapentin that I think will be beneficial.  Patient will follow up with me again in 4 to 8 weeks.

## 2020-01-05 NOTE — Telephone Encounter (Signed)
Incoming call from Patient asking how far apart  she can take he new medication. Form her other medication.  Encouraged Pt. To call her Pharmacy.Voiced understanding.      Answer Assessment - Initial Assessment Questions 1.   NAME of MEDICATION: "What medicine are you calling about?"     xanax 2.   QUESTION: "What is your question?"    Can patient take xanax during the day so it wont interact with gabepentinat night? 3.   PRESCRIBING HCP: "Who prescribed it?" Reason: if prescribed by specialist, call should be referred to that group.    [facility] 4. SYMPTOMS: "Do you have any symptoms?"     *No Answer* 5. SEVERITY: If symptoms are present, ask "Are they mild, moderate or severe?"     *No Answer* 6.  PREGNANCY:  "Is there any chance that you are pregnant?" "When was your last menstrual period?"     *No Answer*  Protocols used: MEDICATION QUESTION CALL-A-AH

## 2020-01-05 NOTE — Progress Notes (Signed)
[place] Phone: [phone] Subjective:   Ruth [name], am serving as a scribe for Dr. Hulan Saas. This visit occurred during the SARS-CoV-2 public health emergency.  Safety protocols were in place, including screening questions prior to the visit, additional usage of staff PPE, and extensive cleaning of exam room while observing appropriate contact time as indicated for disinfecting solutions.   I'm seeing this patient by the request  of:  Wendie Agreste, MD  CC: Neck and shoulder pain  [id number]   11/16/2019 Chronic neck pain secondary to more postural imbalances.  Patient does have some mild shoulder irregularity that could be contributing as well.  Change different exercises at this time.  Discussed icing regimen and home exercises.  Patient's problem with the neck is going to be chronic but seems to be stable at this time.  Follow-up with me again in 4 to 8 weeks  Update 01/05/2020 Ruth [name] is a 39 y.o. female coming in with complaint of left shoulder and neck pain. Patient states that she was having sharp pain in the left shoulder one week after she last saw [place]. Moved her appointment out so she could do home exercises. Feels her progress is slow moving but wants to know if she is on the right track. Sharp pain with quick movements such as playing with her cat.  Patient continues to have some discomfort and seems that more of it seems to be in the shoulder.      Past Medical History:  Diagnosis Date  . Allergy   . Arthritis   . Frequency-urgency sydrome   . GERD (gastroesophageal reflux disease)    [name] meds  . PONV (postoperative nausea and vomiting)    Past Surgical History:  Procedure Laterality Date  . LAPAROSCOPY  04/19/2012   Procedure: LAPAROSCOPY DIAGNOSTIC;  Surgeon: Selinda Orion, MD;  Location: [facility];  Service: Gynecology;  Laterality: N/A;  left ovarian  cystectomy with excision of BENIGN l cystic 7.6cm left teratoma WITH ENDO CATCH REMOVAL and cauterization of endometriosis  . nasal septoplasty  '11  . SKIN BIOPSY Left 09/05/2011   left breast- 0 tmt.   . WISDOM TOOTH EXTRACTION     Social History   Socioeconomic History  . Marital status: Single    Spouse name: Not on file  . Number of children: Not on file  . Years of education: Not on file  . Highest education level: Not on file  Occupational History  . Not on file  Tobacco Use  . Smoking status: Former Smoker    Packs/day: 1.00    Quit date: 04/12/2004    Years since quitting: 15.7  . Smokeless tobacco: Never Used  Substance and Sexual Activity  . Alcohol use: Yes    Comment: social  . Drug use: [name]  . Sexual activity: Yes  Other Topics Concern  . Not on file  Social History Narrative   Lives with partner.     Social Determinants of Health   Financial Resource Strain:   . Difficulty of Paying Living Expenses:   Food Insecurity:   . Worried About Charity fundraiser in the Last Year:   . Arboriculturist in the Last Year:   Transportation Needs:   . Film/video editor (Medical):   Marland Kitchen Lack of Transportation (Non-Medical):   Physical Activity:   . Days of Exercise per Week:   .  Minutes of Exercise per Session:   Stress:   . Feeling of Stress :   Social Connections:   . Frequency of Communication with Friends and Family:   . Frequency of Social Gatherings with Friends and Family:   . Attends Religious Services:   . Active Member of Clubs or Organizations:   . Attends Archivist Meetings:   Marland Kitchen Marital Status:    Allergies  Allergen Reactions  . Lactose Intolerance (Gi) Hives    ANY MILK  BASED PROTEIN  . Whey Protein [Protein] Hives   Family History  Problem Relation Age of Onset  . Stroke Father   . Cancer Maternal Grandmother   . Diabetes Maternal Grandmother          Current Outpatient Medications (Other):  Marland Kitchen  ALPRAZolam (XANAX)  0.5 MG tablet, Take 1 tablet (0.5 mg total) by mouth at bedtime as needed for anxiety. .  gabapentin (NEURONTIN) 100 MG capsule, Take 2 capsules (200 mg total) by mouth at bedtime.   Reviewed prior external information including notes and imaging from  primary care provider As well as notes that were available from care everywhere and other healthcare systems.  Past medical history, social, surgical and family history all reviewed in electronic medical record.  [name] pertanent information unless stated regarding to the chief complaint.   Review of Systems:  [name] headache, visual changes, nausea, vomiting, diarrhea, constipation, dizziness, abdominal pain, skin rash, fevers, chills, night sweats, weight loss, swollen lymph nodes, body aches, joint swelling, chest pain, shortness of breath, mood changes. POSITIVE muscle aches  Objective  Blood pressure 106/72, pulse 97, height 5\' 4"  (1.626 m), weight 145 lb (65.8 kg), last menstrual period 12/25/2019, SpO2 98 %.   General: [name] apparent distress alert and oriented x3 mood and affect normal, dressed appropriately.  HEENT: Pupils equal, extraocular movements intact  Respiratory: Patient's speak in full sentences and does not appear short of breath  Cardiovascular: [name] lower extremity edema, non tender, [name] erythema  Skin: Warm dry intact with [name] signs of infection or rash on extremities or on axial skeleton.  Abdomen: Soft nontender  Neuro: Cranial nerves II through XII are intact, neurovascularly intact in all extremities with 2+ DTRs and 2+ pulses.  Lymph: [name] lymphadenopathy of posterior or anterior cervical chain or axillae bilaterally.  Gait normal with good balance and coordination.  MSK: Left shoulder does have significant decrease in certain range of motion.  Patient has lack of 10 degrees of internal and external range of motion.  Lacks last 5 degrees of forward flexion as well.  Rotator cuff appears to be strong.  Patient does have atrophy of  the shoulder girdle compared to the contralateral side.  Patient does have 5 and 5 strength of the grip strength.  Deep tendon reflexes intact.  Neck exam does show that patient does have loss of lordosis.  Mild worsening pain with extension of the neck especially to the left side.  [name] true radicular symptoms low.  Osteopathic findings  C2 flexed rotated and side bent right C6 flexed rotated and side bent left T3 extended rotated and side bent right inhaled third rib L4 flexed rotated and side bent left  Sacrum right on right  After informed written and verbal consent, patient was seated on exam table. Left shoulder was prepped with alcohol swab and utilizing posterior approach, patient's right glenohumeral space was injected with 4:1  marcaine 0.5%: Kenalog 40mg /dL. Patient tolerated the procedure well without immediate complications.  Impression and Recommendations:     This case required medical decision making of moderate complexity. The above documentation has been reviewed and is accurate and complete Lyndal Pulley, DO       Note: This dictation was prepared with Dragon dictation along with smaller phrase technology. Any transcriptional errors that result from this process are unintentional.

## 2020-01-05 NOTE — Assessment & Plan Note (Signed)
Decision today to treat with OMT was based on Physical Exam  After verbal consent patient was treated with HVLA, ME, FPR techniques in cervical, thoracic, rib,  lumbar and sacral areas  Patient tolerated the procedure well with improvement in symptoms  Patient given exercises, stretches and lifestyle modifications  See medications in patient instructions if given  Patient will follow up in 4-8 weeks 

## 2020-01-05 NOTE — Assessment & Plan Note (Signed)
Patient does have chronic neck pain.  Wound does seem to be a chronic problem and does seem minorly stable but is somewhat concerned with the weakness the patient is getting with the left shoulder girdle.  Patient though does have signs and symptoms consistent with more frozen shoulder as well.  Given injection in the left shoulder and did have improvement in range of motion and strength

## 2020-01-25 ENCOUNTER — Ambulatory Visit: Payer: BC Managed Care – PPO | Admitting: Physical Therapy

## 2020-01-31 ENCOUNTER — Ambulatory Visit: Payer: BC Managed Care – PPO | Admitting: Physical Therapy

## 2020-01-31 ENCOUNTER — Encounter: Payer: Self-pay | Admitting: Physical Therapy

## 2020-01-31 ENCOUNTER — Other Ambulatory Visit: Payer: Self-pay

## 2020-01-31 DIAGNOSIS — M25512 Pain in left shoulder: Secondary | ICD-10-CM | POA: Diagnosis not present

## 2020-01-31 DIAGNOSIS — M25612 Stiffness of left shoulder, not elsewhere classified: Secondary | ICD-10-CM | POA: Diagnosis not present

## 2020-01-31 NOTE — Therapy (Signed)
[facility] [address] [place], [place], [address] Phone: [phone]   Fax:  [phone]  Physical Therapy Evaluation  Patient Details  Name: Ruth [name] MRN: [id number] Date of Birth: 01-06-1981 Referring Provider (PT): Charlann Boxer   Encounter Date: 01/31/2020  PT End of Session - 01/31/20 1518    Visit Number  1    Number of Visits  12    Date for PT Re-Evaluation  03/13/20    Authorization Type  BCBS    PT Start Time  1430    PT Stop Time  1513    PT Time Calculation (min)  43 min    Activity Tolerance  Patient tolerated treatment well    Behavior During Therapy  [facility] for tasks assessed/performed       Past Medical History:  Diagnosis Date  . Allergy   . Arthritis   . Frequency-urgency sydrome   . GERD (gastroesophageal reflux disease)    no meds  . PONV (postoperative nausea and vomiting)     Past Surgical History:  Procedure Laterality Date  . LAPAROSCOPY  04/19/2012   Procedure: LAPAROSCOPY DIAGNOSTIC;  Surgeon: Selinda Orion, MD;  Location: [facility];  Service: Gynecology;  Laterality: N/A;  left ovarian cystectomy with excision of BENIGN l cystic 7.6cm left teratoma WITH ENDO CATCH REMOVAL and cauterization of endometriosis  . nasal septoplasty  '11  . SKIN BIOPSY Left 09/05/2011   left breast- 0 tmt.   . WISDOM TOOTH EXTRACTION      There were no vitals filed for this visit.   Subjective Assessment - 01/31/20 1437    Subjective  L shoulder pain, stiffness, did have injection, states minimal relief. States mild pain, increased shooting pains at times. Works as Probation officer, likes to read, play music. Also states chronic neck and upper back tightness.    Patient Stated Goals  decreased pain, improved movment and function of shoulder    Currently in Pain?  Yes    Pain Score  3     Pain Location  Shoulder    Pain Orientation  Left    Pain Descriptors / Indicators  Aching    Pain Type  Acute pain     Pain Onset  More than a month ago    Pain Frequency  Intermittent    Aggravating Factors   movement, reaching,    Pain Relieving Factors  none         [facility] PT Assessment - 01/31/20 0001      Assessment   Medical Diagnosis  L shoulder Pain    Referring Provider (PT)  Charlann Boxer    Hand Dominance  Right    Prior Therapy  no      Balance Screen   Has the patient fallen in the past 6 months  No      Prior Function   Level of Independence  Independent      Cognition   Overall Cognitive Status  Within Functional Limits for tasks assessed      Posture/Postural Control   Posture Comments  (flexible) rounded shoulders       ROM / Strength   AROM / PROM / Strength  AROM;PROM;Strength      AROM   AROM Assessment Site  Shoulder    Right/Left Shoulder  Left    Left Shoulder Flexion  135 Degrees    Left Shoulder ABduction  90 Degrees    Left Shoulder Internal Rotation  50 Degrees    Left Shoulder External Rotation  45 Degrees      PROM   PROM Assessment Site  Shoulder    Right/Left Shoulder  Left    Left Shoulder Flexion  145 Degrees    Left Shoulder Internal Rotation  55 Degrees    Left Shoulder External Rotation  55 Degrees      Strength   Overall Strength Comments  taken at mid range     Strength Assessment Site  Shoulder    Right/Left Shoulder  Left    Left Shoulder Flexion  4+/5    Left Shoulder ABduction  4+/5    Left Shoulder Internal Rotation  4/5      Palpation   Palpation comment  Soreness in anterior shoulder , pec, axilla,  hypomobile GHJ                Objective measurements completed on examination: See above findings.      [facility] PT Treatment/Exercise - 01/31/20 0001      Exercises   Exercises  Shoulder      Shoulder Exercises: Supine   External Rotation  15 reps;AAROM    External Rotation Limitations  at 45 deg and 90 deg    Flexion  15 reps;AAROM    Flexion Limitations  cane      Shoulder Exercises: Standing   Other  Standing Exercises  wall slides x10 flexion      Shoulder Exercises: Pulleys   Flexion  2 minutes      Shoulder Exercises: Stretch   Other Shoulder Stretches  IR behind back 2 hands 10 sec x 5;              PT Education - 01/31/20 1518    Education Details  PT POC, HEP, Exam findings.    Person(s) Educated  Patient    Methods  Explanation;Demonstration;Tactile cues;Verbal cues;Handout    Comprehension  Verbalized understanding;Returned demonstration;Verbal cues required;Tactile cues required       PT Short Term Goals - 01/31/20 1519      PT SHORT TERM GOAL #1   Title  Pt to be independent wtih initial HEP    Time  2    Period  Weeks    Status  New    Target Date  02/14/20      PT SHORT TERM GOAL #2   Title  Pt to demo improved flexion AROM by at least 5 degrees    Time  2    Period  Weeks    Status  New    Target Date  02/14/20        PT Long Term Goals - 01/31/20 1520      PT LONG TERM GOAL #1   Title  Pt to be independent with final HEP    Time  6    Period  Weeks    Status  New    Target Date  03/13/20      PT LONG TERM GOAL #2   Title  Pt to demo improved AROM to be WNL in L shoulder, to improve ability for reaching, ADLs and IADLS.    Time  6    Period  Weeks    Status  New    Target Date  03/13/20      PT LONG TERM GOAL #3   Title  Pt to demo improved strength of shoulder and scapular muscles to at least 4+/5 to improve ability for functional activity ,  reaching, lifting, carrying.    Time  6    Period  Weeks    Status  New    Target Date  03/13/20      PT LONG TERM GOAL #4   Title  Pt to report decreased pain in L shoulder to 0-2/10 with functional activities.    Time  6    Period  Weeks    Status  New    Target Date  03/13/20             Plan - 01/31/20 1526    Clinical Impression Statement  Pt presents with primary complaint of increased pain in L shoulder. She has GHJ stiffness that is limiting PROM and ability for AROM. Pt  with strength deficits in shoulder as well as scapular muscles. Pt with increased tightness and soreness in cervical musculature as well. Pt with decreased ability for ful funcitonal activities, due to stiffness and pain. Pt to benefit from skilled PT to improve.    Examination-Activity Limitations  Reach Overhead;Sleep;Dressing;Lift;Carry    Examination-Participation Restrictions  Meal Prep;Cleaning;Community Activity;Driving;Shop;Laundry;Yard Work    Stability/Clinical Decision Making  Stable/Uncomplicated    Clinical Decision Making  Low    Rehab Potential  Good    PT Frequency  2x / week    PT Duration  6 weeks    PT Treatment/Interventions  ADLs/Self Care Home Management;Cryotherapy;Electrical Stimulation;DME Instruction;Ultrasound;Moist Heat;Iontophoresis 4mg /ml Dexamethasone;Traction;Functional mobility training;Therapeutic exercise;Neuromuscular re-education;Therapeutic activities;Patient/family education;Manual techniques;Taping;Dry needling;Passive range of motion;Vasopneumatic Device;Spinal Manipulations;Joint Manipulations    Consulted and Agree with Plan of Care  Patient       Patient will benefit from skilled therapeutic intervention in order to improve the following deficits and impairments:  Decreased range of motion, Increased muscle spasms, Pain, Decreased activity tolerance, Hypomobility, Improper body mechanics, Impaired flexibility, Decreased strength  Visit Diagnosis: Acute pain of left shoulder  Stiffness of left shoulder, not elsewhere classified     Problem List Patient Active Problem List   Diagnosis Date Noted  . Nonallopathic lesion of rib cage 01/05/2020  . Nonallopathic lesion of sacral region 01/05/2020  . 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 Chest pain 12/02/2018  . Tachycardia 12/02/2018  . Chronic neck pain 12/29/2016  . Chronic pain syndrome 12/29/2016  . Nonallopathic lesion of cervical region 12/29/2016  . Nonallopathic lesion of thoracic region  12/29/2016  . Nonallopathic lesion of lumbosacral region 12/29/2016  . 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 HYPOTENSION 07/23/2007  . ACNE, MILD 07/23/2007   Lyndee Hensen, PT, DPT 4:17 PM  01/31/20    [place], [place], [address] Phone: [phone]   Fax:  [phone]  Name: Ruth [name] MRN: [id number] Date of Birth: [birth year]

## 2020-01-31 NOTE — Patient Instructions (Signed)
Access Code: [email or web address] URL: [email or web address] Date: 01/31/2020 Prepared by: Lyndee Hensen  Exercises Supine Shoulder Flexion with Dowel - 2 x daily - 1 sets - 10 reps - 5 hold Supine Shoulder External Rotation in 45 Degrees Abduction AAROM with Dowel - 2 x daily - 1 sets - 10 reps - 5 hold Supine Shoulder External Rotation with Dowel - 2 x daily - 1 sets - 10 reps - 5 hold Standing Shoulder Internal Rotation Stretch with Hands Behind Back - 2 x daily - 5 reps - 10 hold Shoulder Flexion Wall Slide with Towel - 3 x daily - 2 sets - 10 reps

## 2020-02-07 ENCOUNTER — Encounter: Payer: BC Managed Care – PPO | Admitting: Physical Therapy

## 2020-02-08 ENCOUNTER — Other Ambulatory Visit: Payer: Self-pay

## 2020-02-08 ENCOUNTER — Encounter: Payer: Self-pay | Admitting: Family Medicine

## 2020-02-08 ENCOUNTER — Telehealth (INDEPENDENT_AMBULATORY_CARE_PROVIDER_SITE_OTHER): Payer: BC Managed Care – PPO | Admitting: Family Medicine

## 2020-02-08 DIAGNOSIS — F5104 Psychophysiologic insomnia: Secondary | ICD-10-CM | POA: Diagnosis not present

## 2020-02-08 MED ORDER — ALPRAZOLAM 0.5 MG PO TABS: 0.5000 mg | ORAL_TABLET | Freq: Every evening | ORAL | 2 refills | Status: DC | PRN

## 2020-02-08 NOTE — Progress Notes (Signed)
Virtual Visit via Video Note  I connected with Ruth [name] on 02/08/20 at 4:25 PM by a video enabled telemedicine application and verified that I am speaking with the correct person using two identifiers.   I discussed the limitations, risks, security and privacy concerns of performing an evaluation and management service by telephone and the availability of in person appointments. I also discussed with the patient that there may be a patient responsible charge related to this service. The patient expressed understanding and agreed to proceed, consent obtained  Chief complaint:  Chief Complaint  Patient presents with  . Medication Refill    pt needs her Pm xanax, pt denies side effects, pt states this has been working well for her.       History of Present Illness: Ruth [name] is a 39 y.o. female   Insomnia: History of psychophysiologic insomnia, previous sleep specialist eval, trial of melatonin, possible sleep study at some point (planned when he left because prohibitive with new insurance in a sleep psychologist.  Had been treated with therapy with [place] therapist when discussed in December.  Treatment of sleep phobia/anxiety.  Continue to work on sleep hygiene, CBT, fear of sleep/phobias.  Denied daytime anxiety, was well controlled with 0.5 mg alprazolam nightly.  Previously  tried other regimens without success.  She was also under the care of Dr. Hulan Saas for myofascial manipulation for neck issues/OMT.  Typewriter tinnitus.  OMT was helpful for the symptoms as well.  Has been doing great.  Has been under care of Dr. Tamala Julian and PT for frozen shoulder. did not fill gabapentin.  Improved with injection and PT.   Still working with online therapist for her sleep - every 3 weeks.  Has weaned down to 0.4mg  of xanax at bedtime- cuts approximately 1/5 of tablet. Has been sleeping well with this decreased dose. Nighttime dosing only.   Controlled substance database (PDMP)  reviewed. No concerns appreciated. Last filled on 11/18/19. Just ran out 3 days ago.   Has completed 1/2 covid vaccines.   Patient Active Problem List   Diagnosis Date Noted  . Nonallopathic lesion of rib cage 01/05/2020  . Nonallopathic lesion of sacral region 01/05/2020  . 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 Chest pain 12/02/2018  . Tachycardia 12/02/2018  . Chronic neck pain 12/29/2016  . Chronic pain syndrome 12/29/2016  . Nonallopathic lesion of cervical region 12/29/2016  . Nonallopathic lesion of thoracic region 12/29/2016  . Nonallopathic lesion of lumbosacral region 12/29/2016  . 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 HYPOTENSION 07/23/2007  . ACNE, MILD 07/23/2007   Past Medical History:  Diagnosis Date  . Allergy   . Arthritis   . Frequency-urgency sydrome   . GERD (gastroesophageal reflux disease)    no meds  . PONV (postoperative nausea and vomiting)    Past Surgical History:  Procedure Laterality Date  . LAPAROSCOPY  04/19/2012   Procedure: LAPAROSCOPY DIAGNOSTIC;  Surgeon: Selinda Orion, MD;  Location: [facility];  Service: Gynecology;  Laterality: N/A;  left ovarian cystectomy with excision of BENIGN l cystic 7.6cm left teratoma WITH ENDO CATCH REMOVAL and cauterization of endometriosis  . nasal septoplasty  '11  . SKIN BIOPSY Left 09/05/2011   left breast- 0 tmt.   . WISDOM TOOTH EXTRACTION     Allergies  Allergen Reactions  . Lactose Intolerance (Gi) Hives    ANY MILK  BASED PROTEIN  . Whey Protein [Protein] Hives   Prior to Admission  medications   Medication Sig Start Date End Date Taking? Authorizing Provider  ALPRAZolam [name]) 0.5 MG tablet Take 1 tablet (0.5 mg total) by mouth at bedtime as needed for anxiety. 02/08/20   Wendie Agreste, MD   Social History   Socioeconomic History  . Marital status: Single    Spouse name: Not on file  . Number of children: Not on file  . Years of education: Not on file  . Highest education level:  Not on file  Occupational History  . Not on file  Tobacco Use  . Smoking status: Former Smoker    Packs/day: 1.00    Quit date: 04/12/2004    Years since quitting: 15.8  . Smokeless tobacco: Never Used  Substance and Sexual Activity  . Alcohol use: Yes    Comment: social  . Drug use: No  . Sexual activity: Yes  Other Topics Concern  . Not on file  Social History Narrative   Lives with partner.     Social Determinants of Health   Financial Resource Strain:   . Difficulty of Paying Living Expenses:   Food Insecurity:   . Worried About Charity fundraiser in the Last Year:   . Arboriculturist in the Last Year:   Transportation Needs:   . Film/video editor (Medical):   Marland Kitchen Lack of Transportation (Non-Medical):   Physical Activity:   . Days of Exercise per Week:   . Minutes of Exercise per Session:   Stress:   . Feeling of Stress :   Social Connections:   . Frequency of Communication with Friends and Family:   . Frequency of Social Gatherings with Friends and Family:   . Attends Religious Services:   . Active Member of Clubs or Organizations:   . Attends Archivist Meetings:   Marland Kitchen Marital Status:   Intimate Partner Violence:   . Fear of Current or Ex-Partner:   . Emotionally Abused:   Marland Kitchen Physically Abused:   . Sexually Abused:     Observations/Objective: Vitals:   02/08/20 1511  BP: 100/82   No distress  on video, appropriate responses, mood euthymic, affect mood congruent.  All questions answered, understanding expressed.   Assessment and Plan: Psychophysiological insomnia - Plan: ALPRAZolam (XANAX) 0.5 MG tablet Stable, and has been able to slightly decrease dosing as above.  Continue same regimen, 15-month refills given, can send in 3 more stable and 6 months in office evaluation.  Continue meeting with therapist.  Return to clinic sooner if worse or new symptoms.  Follow Up Instructions:  22months in office.    I discussed the assessment and  treatment plan with the patient. The patient was provided an opportunity to ask questions and all were answered. The patient agreed with the plan and demonstrated an understanding of the instructions.   The patient was advised to call back or seek an in-person evaluation if the symptoms worsen or if the condition fails to improve as anticipated.  I provided 8 minutes of non-face-to-face time during this encounter.   Wendie Agreste, MD

## 2020-02-08 NOTE — Progress Notes (Signed)
Subjective:  Patient ID: Ruth [name], female    DOB: 1981-05-04  Age: 39 y.o. MRN: [id number]  CC:  Chief Complaint  Patient presents with  . Medication Refill    pt needs her Pm xanax, pt denies side effects, pt states this has been working well for her.      HPI Ruth [name] presents for   Insomnia: History of psychophysiologic insomnia, previous sleep specialist eval, trial of melatonin, possible sleep study at some point (planned when he left because prohibitive with new insurance in a sleep psychologist.  Had been treated with therapy with [place] therapist when discussed in December.  Treatment of sleep phobia/anxiety.  Continue to work on sleep hygiene, CBT, fear of sleep/phobias.  Denied daytime anxiety, was well controlled with 0.5 mg alprazolam nightly.  Previously  tried other regimens without success.  She was also under the care of Dr. Hulan Saas for myofascial manipulation for neck issues/OMT.  Typewriter tinnitus.  OMT was helpful for the symptoms as well.  Has been doing great.  Has been under care of Dr. Tamala Julian and PT for frozen shoulder. did not fill gabapentin.  Improved with injection and PT.   Still working with online therapist for her sleep - every 3 weeks.  Has weaned down to 0.4mg  of xanax at bedtime- cuts approximately 1/5 of tablet. Has been sleeping well with this decreased dose. Nighttime dosing only.   Controlled substance database (PDMP) reviewed. No concerns appreciated. Last filled on 11/18/19. Just ran out 3 days ago.   Has completed 1/2 covid vaccines.   History Patient Active Problem List   Diagnosis Date Noted  . Nonallopathic lesion of rib cage 01/05/2020  . Nonallopathic lesion of sacral region 01/05/2020  . 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 Chest pain 12/02/2018  . Tachycardia 12/02/2018  . Chronic neck pain 12/29/2016  . Chronic pain syndrome 12/29/2016  . Nonallopathic lesion of cervical region 12/29/2016  . Nonallopathic lesion  of thoracic region 12/29/2016  . Nonallopathic lesion of lumbosacral region 12/29/2016  . 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 HYPOTENSION 07/23/2007  . ACNE, MILD 07/23/2007   Past Medical History:  Diagnosis Date  . Allergy   . Arthritis   . Frequency-urgency sydrome   . GERD (gastroesophageal reflux disease)    no meds  . PONV (postoperative nausea and vomiting)    Past Surgical History:  Procedure Laterality Date  . LAPAROSCOPY  04/19/2012   Procedure: LAPAROSCOPY DIAGNOSTIC;  Surgeon: Selinda Orion, MD;  Location: [facility];  Service: Gynecology;  Laterality: N/A;  left ovarian cystectomy with excision of BENIGN l cystic 7.6cm left teratoma WITH ENDO CATCH REMOVAL and cauterization of endometriosis  . nasal septoplasty  '11  . SKIN BIOPSY Left 09/05/2011   left breast- 0 tmt.   . WISDOM TOOTH EXTRACTION     Allergies  Allergen Reactions  . Lactose Intolerance (Gi) Hives    ANY MILK  BASED PROTEIN  . Whey Protein [Protein] Hives   Prior to Admission medications   Medication Sig Start Date End Date Taking? Authorizing Provider  ALPRAZolam [name]) 0.5 MG tablet Take 1 tablet (0.5 mg total) by mouth at bedtime as needed for anxiety. 10/03/19  Yes Wendie Agreste, MD   Social History   Socioeconomic History  . Marital status: Single    Spouse name: Not on file  . Number of children: Not on file  . Years of education: Not on file  .  Highest education level: Not on file  Occupational History  . Not on file  Tobacco Use  . Smoking status: Former Smoker    Packs/day: 1.00    Quit date: 04/12/2004    Years since quitting: 15.8  . Smokeless tobacco: Never Used  Substance and Sexual Activity  . Alcohol use: Yes    Comment: social  . Drug use: No  . Sexual activity: Yes  Other Topics Concern  . Not on file  Social History Narrative   Lives with partner.     Social Determinants of Health   Financial Resource Strain:   . Difficulty of Paying  Living Expenses:   Food Insecurity:   . Worried About Charity fundraiser in the Last Year:   . Arboriculturist in the Last Year:   Transportation Needs:   . Film/video editor (Medical):   Marland Kitchen Lack of Transportation (Non-Medical):   Physical Activity:   . Days of Exercise per Week:   . Minutes of Exercise per Session:   Stress:   . Feeling of Stress :   Social Connections:   . Frequency of Communication with Friends and Family:   . Frequency of Social Gatherings with Friends and Family:   . Attends Religious Services:   . Active Member of Clubs or Organizations:   . Attends Archivist Meetings:   Marland Kitchen Marital Status:   Intimate Partner Violence:   . Fear of Current or Ex-Partner:   . Emotionally Abused:   Marland Kitchen Physically Abused:   . Sexually Abused:     Review of Systems Per HPI.   Depression screen [facility] 2/9 02/08/2020 06/30/2019 03/23/2019 01/14/2019 12/03/2018  Decreased Interest 0 0 0 0 0  Down, Depressed, Hopeless 0 0 0 0 0  PHQ - 2 Score 0 0 0 0 0  Altered sleeping - - - 0 3  Tired, decreased energy - - - 0 3  Change in appetite - - - 0 0  Feeling bad or failure about yourself  - - - 0 0  Trouble concentrating - - - 0 1  Moving slowly or fidgety/restless - - - 0 0  Suicidal thoughts - - - 0 0  PHQ-9 Score - - - 0 7  Difficult doing work/chores - - - Not difficult at all Not difficult at all      Objective:   Vitals:   02/08/20 1511  BP: 100/82    Physical Exam     Assessment & Plan:  Ruth [name] is a 39 y.o. female . Psychophysiological insomnia - Plan: ALPRAZolam (XANAX) 0.5 MG tablet   No orders of the defined types were placed in this encounter.  Patient Instructions       If you have lab work done today you will be contacted with your lab results within the next 2 weeks.  If you have not heard from [place] then please contact us. The fastest way to get your results is to register for My Chart.   IF you received an x-ray today, you will  receive an invoice from [facility]. Please contact [facility] at [phone] with questions or concerns regarding your invoice.   IF you received labwork today, you will receive an invoice from [place]. Please contact [facility] at [phone] with questions or concerns regarding your invoice.   Our billing staff will not be able to assist you with questions regarding bills from these companies.  You will be contacted with the  lab results as soon as they are available. The fastest way to get your results is to activate your My Chart account. Instructions are located on the last page of this paperwork. If you have not heard from [place] regarding the results in 2 weeks, please contact this office.         Signed, [name], MD [facility]

## 2020-02-08 NOTE — Patient Instructions (Addendum)
  Good talking to you today.  I am glad to hear things are going well.  No change in medications for now, okay to continue tapering down as tolerated.  Follow-up in 6 months in office but please let me know if there are questions prior to that time.   If you have lab work done today you will be contacted with your lab results within the next 2 weeks.  If you have not heard from [place] then please contact us. The fastest way to get your results is to register for My Chart.   IF you received an x-ray today, you will receive an invoice from [facility]. Please contact [facility] Radiology at [phone] with questions or concerns regarding your invoice.   IF you received labwork today, you will receive an invoice from [place]. Please contact [facility] at [phone] with questions or concerns regarding your invoice.   Our billing staff will not be able to assist you with questions regarding bills from these companies.  You will be contacted with the lab results as soon as they are available. The fastest way to get your results is to activate your My Chart account. Instructions are located on the last page of this paperwork. If you have not heard from [place] regarding the results in 2 weeks, please contact this office.

## 2020-02-14 ENCOUNTER — Encounter: Payer: Self-pay | Admitting: Family Medicine

## 2020-02-14 ENCOUNTER — Ambulatory Visit: Payer: BC Managed Care – PPO | Admitting: Family Medicine

## 2020-02-14 ENCOUNTER — Other Ambulatory Visit: Payer: Self-pay

## 2020-02-14 ENCOUNTER — Encounter: Payer: BC Managed Care – PPO | Admitting: Physical Therapy

## 2020-02-14 VITALS — BP 120/76 | HR 92 | Ht 64.0 in | Wt 143.0 lb

## 2020-02-14 DIAGNOSIS — M999 Biomechanical lesion, unspecified: Secondary | ICD-10-CM

## 2020-02-14 DIAGNOSIS — M542 Cervicalgia: Secondary | ICD-10-CM | POA: Diagnosis not present

## 2020-02-14 DIAGNOSIS — G8929 Other chronic pain: Secondary | ICD-10-CM

## 2020-02-14 MED ORDER — GABAPENTIN 100 MG PO CAPS: 200.0000 mg | ORAL_CAPSULE | Freq: Every day | ORAL | 3 refills | Status: DC

## 2020-02-14 NOTE — Assessment & Plan Note (Signed)
Chronic problem with exacerbation.  Encourage patient to take the gabapentin on a more regular basis and likely will be more of a chronic medication.  Patient is to continue with the home exercises icing regimen.  Responds fairly well though to osteopathic manipulation.  Warned of different ergonomic changes that I think will be beneficial.  Follow-up again in 4 to 8 weeks

## 2020-02-14 NOTE — Assessment & Plan Note (Signed)

## 2020-02-14 NOTE — Progress Notes (Signed)
[place] Phone: [phone] Subjective:   [name], am serving as a scribe for Dr. Hulan Saas. This visit occurred during the SARS-CoV-2 public health emergency.  Safety protocols were in place, including screening questions prior to the visit, additional usage of staff PPE, and extensive cleaning of exam room while observing appropriate contact time as indicated for disinfecting solutions.   I'm seeing this patient by the request  of:  Ruth Agreste, MD  CC: Back pain neck pain follow-up  [id number]   01/05/2020 Patient given injection, tolerated the procedure well, discussed icing regimen and home exercise, which activities to do which wants to avoid.  Patient is to increase activity slowly.  Increase range of motion.  Discussed the possibility of formal physical therapy.  Referral placed.  Follow-up again 4 to 8 weeks  Update 02/14/2020 TEAL TREVATHAN is a 39 y.o. female coming in with complaint of back and left shoulder pain. Last seen for OMT on 01/05/2020. Patient states that she still has pain in the shoulder with sleeping. Having numbness in left hand, specifically the 2nd finger the next day following a day with a lot of activity.  Patient has been very active.  Patient denies with radiation down the arm states that this seems to be a little bit different than what she has had in the past.    Past Medical History:  Diagnosis Date  . Allergy   . Arthritis   . Frequency-urgency sydrome   . GERD (gastroesophageal reflux disease)    no meds  . PONV (postoperative nausea and vomiting)    Past Surgical History:  Procedure Laterality Date  . LAPAROSCOPY  04/19/2012   Procedure: LAPAROSCOPY DIAGNOSTIC;  Surgeon: Selinda Orion, MD;  Location: [facility];  Service: Gynecology;  Laterality: N/A;  left ovarian cystectomy with excision of BENIGN l cystic 7.6cm left teratoma WITH ENDO  CATCH REMOVAL and cauterization of endometriosis  . nasal septoplasty  '11  . SKIN BIOPSY Left 09/05/2011   left breast- 0 tmt.   . WISDOM TOOTH EXTRACTION     Social History   Socioeconomic History  . Marital status: Single    Spouse name: Not on file  . Number of children: Not on file  . Years of education: Not on file  . Highest education level: Not on file  Occupational History  . Not on file  Tobacco Use  . Smoking status: Former Smoker    Packs/day: 1.00    Quit date: 04/12/2004    Years since quitting: 15.8  . Smokeless tobacco: Never Used  Substance and Sexual Activity  . Alcohol use: Yes    Comment: social  . Drug use: No  . Sexual activity: Yes  Other Topics Concern  . Not on file  Social History Narrative   Lives with partner.     Social Determinants of Health   Financial Resource Strain:   . Difficulty of Paying Living Expenses:   Food Insecurity:   . Worried About Charity fundraiser in the Last Year:   . Arboriculturist in the Last Year:   Transportation Needs:   . Film/video editor (Medical):   Marland Kitchen Lack of Transportation (Non-Medical):   Physical Activity:   . Days of Exercise per Week:   . Minutes of Exercise per Session:   Stress:   . Feeling of Stress :   Social Connections:   .  Frequency of Communication with Friends and Family:   . Frequency of Social Gatherings with Friends and Family:   . Attends Religious Services:   . Active Member of Clubs or Organizations:   . Attends Archivist Meetings:   Marland Kitchen Marital Status:    Allergies  Allergen Reactions  . Lactose Intolerance (Gi) Hives    ANY MILK  BASED PROTEIN  . Whey Protein [Protein] Hives   Family History  Problem Relation Age of Onset  . Stroke Father   . Cancer Maternal Grandmother   . Diabetes Maternal Grandmother          Current Outpatient Medications (Other):  Marland Kitchen  ALPRAZolam (XANAX) 0.5 MG tablet, Take 1 tablet (0.5 mg total) by mouth at bedtime as needed  for anxiety. .  gabapentin (NEURONTIN) 100 MG capsule, Take 2 capsules (200 mg total) by mouth at bedtime.   Reviewed prior external information including notes and imaging from  primary care provider As well as notes that were available from care everywhere and other healthcare systems.  Past medical history, social, surgical and family history all reviewed in electronic medical record.  No pertanent information unless stated regarding to the chief complaint.   Review of Systems:  No headache, visual changes, nausea, vomiting, diarrhea, constipation, dizziness, abdominal pain, skin rash, fevers, chills, night sweats, weight loss, swollen lymph nodes, body aches, joint swelling, chest pain, shortness of breath, mood changes. POSITIVE muscle aches  Objective  Blood pressure 120/76, pulse 92, height 5\' 4"  (1.626 m), weight 143 lb (64.9 kg), SpO2 99 %.   General: No apparent distress alert and oriented x3 mood and affect normal, dressed appropriately.  HEENT: Pupils equal, extraocular movements intact  Respiratory: Patient's speak in full sentences and does not appear short of breath  Cardiovascular: No lower extremity edema, non tender, no erythema  Neuro: Cranial nerves II through XII are intact, neurovascularly intact in all extremities with 2+ DTRs and 2+ pulses.  Gait normal with good balance and coordination.  MSK:  tender with full range of motion and good stability and symmetric strength and tone of shoulders, elbows, wrist, hip, knee and ankles bilaterally.  Patient's upper back exam shows that there is tenderness to palpation in the paraspinal musculature lumbar spine left greater than right.  Patient has worsening pain with extension of the neck on the left.  Patient has 5/5 strength of the upper extremities though bilaterally.  Osteopathic findings C4 flexed rotated and side bent left C7 flexed rotated and side bent left T3 extended rotated and side bent left inhaled third  rib T9 extended rotated and side bent left L2 flexed rotated and side bent right Sacrum right on right    Impression and Recommendations:     This case required medical decision making of moderate complexity. The above documentation has been reviewed and is accurate and complete Lyndal Pulley, DO       Note: This dictation was prepared with Dragon dictation along with smaller phrase technology. Any transcriptional errors that result from this process are unintentional.

## 2020-02-14 NOTE — Patient Instructions (Signed)
Gabapentin for the next week 200 mg at night See me again in 5 weeks

## 2020-02-21 ENCOUNTER — Encounter: Payer: BC Managed Care – PPO | Admitting: Physical Therapy

## 2020-02-28 ENCOUNTER — Encounter: Payer: BC Managed Care – PPO | Admitting: Physical Therapy

## 2020-03-21 ENCOUNTER — Ambulatory Visit: Payer: BC Managed Care – PPO | Admitting: Family Medicine

## 2020-03-21 NOTE — Progress Notes (Deleted)
  [place] Phone: [phone] Subjective:    I'm seeing this patient by the request  of:  Wendie Agreste, MD  CC:   [id number]  NOLAH LATSHAW is a 39 y.o. female coming in with complaint of back and neck pain Patient states   Medications patient has been prescribed:   Taking:         Reviewed prior external information including notes and imaging from previsou exam, outside providers and external EMR if available.   As well as notes that were available from care everywhere and other healthcare systems.  Past medical history, social, surgical and family history all reviewed in electronic medical record.  No pertanent information unless stated regarding to the chief complaint.   Past Medical History:  Diagnosis Date  . Allergy   . Arthritis   . Frequency-urgency sydrome   . GERD (gastroesophageal reflux disease)    no meds  . PONV (postoperative nausea and vomiting)     Allergies  Allergen Reactions  . Lactose Intolerance (Gi) Hives    ANY MILK  BASED PROTEIN  . Whey Protein [Protein] Hives     Review of Systems:  No headache, visual changes, nausea, vomiting, diarrhea, constipation, dizziness, abdominal pain, skin rash, fevers, chills, night sweats, weight loss, swollen lymph nodes, body aches, joint swelling, chest pain, shortness of breath, mood changes. POSITIVE muscle aches  Objective  There were no vitals taken for this visit.   General: No apparent distress alert and oriented x3 mood and affect normal, dressed appropriately.  HEENT: Pupils equal, extraocular movements intact  Respiratory: Patient's speak in full sentences and does not appear short of breath  Cardiovascular: No lower extremity edema, non tender, no erythema  Neuro: Cranial nerves II through XII are intact, neurovascularly intact in all extremities with 2+ DTRs and 2+ pulses.  Gait normal with good balance and  coordination.  MSK:  Non tender with full range of motion and good stability and symmetric strength and tone of shoulders, elbows, wrist, hip, knee and ankles bilaterally.  Back - Normal skin, Spine with normal alignment and no deformity.  No tenderness to vertebral process palpation.  Paraspinous muscles are not tender and without spasm.   Range of motion is full at neck and lumbar sacral regions  Osteopathic findings  C2 flexed rotated and side bent right C6 flexed rotated and side bent left T3 extended rotated and side bent right inhaled rib T9 extended rotated and side bent left L2 flexed rotated and side bent right Sacrum right on right       Assessment and Plan:    Nonallopathic problems  Decision today to treat with OMT was based on Physical Exam  After verbal consent patient was treated with HVLA, ME, FPR techniques in cervical, rib, thoracic, lumbar, and sacral  areas  Patient tolerated the procedure well with improvement in symptoms  Patient given exercises, stretches and lifestyle modifications  See medications in patient instructions if given  Patient will follow up in 4-8 weeks      The above documentation has been reviewed and is accurate and complete Lyndal Pulley, DO       Note: This dictation was prepared with Dragon dictation along with smaller phrase technology. Any transcriptional errors that result from this process are unintentional.

## 2020-04-03 ENCOUNTER — Telehealth: Payer: Self-pay

## 2020-04-03 NOTE — Telephone Encounter (Signed)
Called and left pt a message asking her to call and set up an appointment so we can refer her to GI no recent visits with associated diagnosis.

## 2020-04-03 NOTE — Telephone Encounter (Signed)
Copied from [place] [phone]. Topic: Referral - Request for Referral >> Mar 29, 2020  1:38 PM [name] wrote: Has patient seen PCP for this complaint? yes  *If NO, is insurance requiring patient see PCP for this issue before PCP can refer them? Referral for which specialty: GI Preferred provider/office: Dr. Ronald Lobo [phone] Reason for referral: Pt request referral to doctor of her choice

## 2020-04-11 ENCOUNTER — Telehealth (INDEPENDENT_AMBULATORY_CARE_PROVIDER_SITE_OTHER): Payer: BC Managed Care – PPO | Admitting: Family Medicine

## 2020-04-11 ENCOUNTER — Other Ambulatory Visit: Payer: Self-pay

## 2020-04-11 ENCOUNTER — Encounter: Payer: Self-pay | Admitting: Family Medicine

## 2020-04-11 VITALS — BP 90/59 | HR 98 | Temp 99.4°F | Ht 64.0 in | Wt 139.0 lb

## 2020-04-11 DIAGNOSIS — F5104 Psychophysiologic insomnia: Secondary | ICD-10-CM | POA: Diagnosis not present

## 2020-04-11 DIAGNOSIS — K602 Anal fissure, unspecified: Secondary | ICD-10-CM | POA: Diagnosis not present

## 2020-04-11 DIAGNOSIS — K625 Hemorrhage of anus and rectum: Secondary | ICD-10-CM | POA: Diagnosis not present

## 2020-04-11 MED ORDER — ALPRAZOLAM 0.5 MG PO TABS: 0.5000 mg | ORAL_TABLET | Freq: Every evening | ORAL | 2 refills | Status: DC | PRN

## 2020-04-11 MED ORDER — DILTIAZEM GEL 2 %: 1.0000 "application " | Freq: Two times a day (BID) | CUTANEOUS | 0 refills | Status: DC

## 2020-04-11 NOTE — Progress Notes (Signed)
Virtual Visit via Video Note  I connected with Ruth [name] on 04/12/20 at 9:53 AM by a video enabled telemedicine application and verified that I am speaking with the correct person using two identifiers.   I discussed the limitations, risks, security and privacy concerns of performing an evaluation and management service by telephone and the availability of in person appointments. I also discussed with the patient that there may be a patient responsible charge related to this service. The patient expressed understanding and agreed to proceed, consent obtained  Chief complaint:  Chief Complaint  Patient presents with  . Referral    pt would like a referal to gastro due to some gastrointestinal issues. pt states she has a lot of pain while defication. pt reports pain in abdominal area and back, but that pain comes and goes. pt takes metamusiel and drinks plenty of  water and makes sure she gets a minimum of 20mg  of fiber daily between natural means or supplements.  . Medication Refill    on xanax. pt reports medication is working well for her with no known side effects.     History of Present Illness: Ruth [name] is a 39 y.o. female  Abdominal issues: Prior difficulty with pain with defecation, anal fissures, treated by Dr. Collene Mares years ago. Lidocaine and probiotics discussed.  telehealth call later- fiber supplement recommended.  Using fiber supplement in shake daily.  Now more lower pain past few months, has to use glove to push stool out, but not large or hard stools, but just hurts in that area. noticed bleeding, and trails of blood to toilet. bright red blood at anal area. Relapsing/remitting blood. Regular. Sore at anal area - min abd cramping, but pain primarily at rectal area.  Having to be careful with diet.   Canker sores in mouth at times when flares.  Has some lidocaine gel that she applies to anal area intermittently.  Call from GI?  Not sure about name. - [facility] with  [facility]. appt scheduled in few weeks.  No unexplained wt loss, night sweats, no fever.   Father with hx of colon CA at age 33 , and maternall GF with colon cancer.    Insomnia discussed in April. Needs refill of alprazolam. Still using less than 1 pill per night as discussed prior. Controlled substance database (PDMP) reviewed. No concerns appreciated.    Patient Active Problem List   Diagnosis Date Noted  . Nonallopathic lesion of rib cage 01/05/2020  . Nonallopathic lesion of sacral region 01/05/2020  . 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 Chest pain 12/02/2018  . Tachycardia 12/02/2018  . Chronic neck pain 12/29/2016  . Chronic pain syndrome 12/29/2016  . Nonallopathic lesion of cervical region 12/29/2016  . Nonallopathic lesion of thoracic region 12/29/2016  . Nonallopathic lesion of lumbosacral region 12/29/2016  . 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 HYPOTENSION 07/23/2007  . ACNE, MILD 07/23/2007   Past Medical History:  Diagnosis Date  . Allergy   . Arthritis   . Frequency-urgency sydrome   . GERD (gastroesophageal reflux disease)    no meds  . PONV (postoperative nausea and vomiting)    Past Surgical History:  Procedure Laterality Date  . LAPAROSCOPY  04/19/2012   Procedure: LAPAROSCOPY DIAGNOSTIC;  Surgeon: Selinda Orion, MD;  Location: [facility];  Service: Gynecology;  Laterality: N/A;  left ovarian cystectomy with excision of BENIGN l cystic 7.6cm left teratoma WITH ENDO CATCH REMOVAL and cauterization of endometriosis  .  nasal septoplasty  '11  . SKIN BIOPSY Left 09/05/2011   left breast- 0 tmt.   . WISDOM TOOTH EXTRACTION     Allergies  Allergen Reactions  . Lactose Intolerance (Gi) Hives    ANY MILK  BASED PROTEIN  . Whey Protein [Protein] Hives   Prior to Admission medications   Medication Sig Start Date End Date Taking? Authorizing Provider  ALPRAZolam [name]) 0.5 MG tablet Take 1 tablet (0.5 mg total) by mouth at bedtime as needed for  anxiety. 02/08/20  Yes Wendie Agreste, MD  gabapentin (NEURONTIN) 100 MG capsule Take 2 capsules (200 mg total) by mouth at bedtime. 02/14/20  Yes Lyndal Pulley, DO  methylcellulose oral powder Take 1 packet by mouth daily.   Yes [provider]  psyllium (METAMUCIL) 58.6 % packet Take 1 packet by mouth daily.   Yes [provider]   Social History   Socioeconomic History  . Marital status: Single    Spouse name: Not on file  . Number of children: Not on file  . Years of education: Not on file  . Highest education level: Not on file  Occupational History  . Not on file  Tobacco Use  . Smoking status: Former Smoker    Packs/day: 1.00    Quit date: 04/12/2004    Years since quitting: 16.0  . Smokeless tobacco: Never Used  Substance and Sexual Activity  . Alcohol use: Yes    Comment: social  . Drug use: No  . Sexual activity: Yes  Other Topics Concern  . Not on file  Social History Narrative   Lives with partner.     Social Determinants of Health   Financial Resource Strain:   . Difficulty of Paying Living Expenses:   Food Insecurity:   . Worried About Charity fundraiser in the Last Year:   . Arboriculturist in the Last Year:   Transportation Needs:   . Film/video editor (Medical):   Marland Kitchen Lack of Transportation (Non-Medical):   Physical Activity:   . Days of Exercise per Week:   . Minutes of Exercise per Session:   Stress:   . Feeling of Stress :   Social Connections:   . Frequency of Communication with Friends and Family:   . Frequency of Social Gatherings with Friends and Family:   . Attends Religious Services:   . Active Member of Clubs or Organizations:   . Attends Archivist Meetings:   Marland Kitchen Marital Status:   Intimate Partner Violence:   . Fear of Current or Ex-Partner:   . Emotionally Abused:   Marland Kitchen Physically Abused:   . Sexually Abused:     Observations/Objective: Vitals:   04/11/20 1022  BP: (!) 90/59  Pulse: 98  Temp:  99.4 F (37.4 C)  TempSrc: Temporal  SpO2: 98%  Weight: 139 lb (63 kg)  Height: 5\' 4"  (1.626 m)  No distress on video.  Appropriate responses.  All questions answered with understanding and plan expressed.  Assessment and Plan: Anal fissure - Plan: diltiazem 2 % GEL Rectal bleeding - Plan: diltiazem 2 % GEL, Ambulatory referral to Gastroenterology  -Reports previous histories of anal fissure, bright red blood and lower symptoms suspicious again for fissure but with family history of colon cancer and recurrent symptoms, agree with gastroenterology evaluation.  She has appointment scheduled, will send referral.   - Continue fiber supplement, preventative techniques. Continue lidocaine topical for now, diltiazem gel also prescribed.  ER/RTC  precautions.  Psychophysiological insomnia - Plan: ALPRAZolam (XANAX) 0.5 MG tablet  -Stable with current dosing, refilled.  Follow Up Instructions: With GI.    I discussed the assessment and treatment plan with the patient. The patient was provided an opportunity to ask questions and all were answered. The patient agreed with the plan and demonstrated an understanding of the instructions.   The patient was advised to call back or seek an in-person evaluation if the symptoms worsen or if the condition fails to improve as anticipated.  I provided 14 minutes of non-face-to-face time during this encounter.   Wendie Agreste, MD

## 2020-04-11 NOTE — Patient Instructions (Signed)
° ° ° °  If you have lab work done today you will be contacted with your lab results within the next 2 weeks.  If you have not heard from us then please contact us. The fastest way to get your results is to register for My Chart. ° ° °IF you received an x-ray today, you will receive an invoice from [facility]. Please contact [facility] at [phone] with questions or concerns regarding your invoice.  ° °IF you received labwork today, you will receive an invoice from [facility]. Please contact [facility] at [phone] with questions or concerns regarding your invoice.  ° °Our billing staff will not be able to assist you with questions regarding bills from these companies. ° °You will be contacted with the lab results as soon as they are available. The fastest way to get your results is to activate your My Chart account. Instructions are located on the last page of this paperwork. If you have not heard from us regarding the results in 2 weeks, please contact this office. °  ° ° ° °

## 2020-06-20 DIAGNOSIS — K649 Unspecified hemorrhoids: Secondary | ICD-10-CM | POA: Diagnosis not present

## 2020-06-20 DIAGNOSIS — K5909 Other constipation: Secondary | ICD-10-CM | POA: Diagnosis not present

## 2020-06-20 DIAGNOSIS — K625 Hemorrhage of anus and rectum: Secondary | ICD-10-CM | POA: Diagnosis not present

## 2020-06-20 DIAGNOSIS — Z800 Family history of malignant neoplasm of digestive organs: Secondary | ICD-10-CM | POA: Diagnosis not present

## 2020-07-25 ENCOUNTER — Telehealth: Payer: Self-pay | Admitting: Family Medicine

## 2020-07-25 NOTE — Telephone Encounter (Signed)
LVMTCB to change appt to virtual with provider. Appt is already changed if pt wouldn't want to do that we can resch. Please advise.

## 2020-07-25 NOTE — Telephone Encounter (Signed)
Pt called  states she is ok with her app by my chart

## 2020-08-03 ENCOUNTER — Telehealth (INDEPENDENT_AMBULATORY_CARE_PROVIDER_SITE_OTHER): Payer: BC Managed Care – PPO | Admitting: Family Medicine

## 2020-08-03 ENCOUNTER — Encounter: Payer: Self-pay | Admitting: Family Medicine

## 2020-08-03 ENCOUNTER — Other Ambulatory Visit: Payer: Self-pay

## 2020-08-03 VITALS — Ht 64.0 in | Wt 138.0 lb

## 2020-08-03 DIAGNOSIS — F5104 Psychophysiologic insomnia: Secondary | ICD-10-CM

## 2020-08-03 DIAGNOSIS — K219 Gastro-esophageal reflux disease without esophagitis: Secondary | ICD-10-CM | POA: Diagnosis not present

## 2020-08-03 DIAGNOSIS — R109 Unspecified abdominal pain: Secondary | ICD-10-CM

## 2020-08-03 MED ORDER — OMEPRAZOLE 20 MG PO CPDR: 20.0000 mg | DELAYED_RELEASE_CAPSULE | Freq: Two times a day (BID) | ORAL | 0 refills | Status: DC

## 2020-08-03 NOTE — Progress Notes (Signed)
Virtual Visit via Video Note  I connected with Ruth [name] on 08/03/20 at 2:52 PM by a video enabled telemedicine application and verified that I am speaking with the correct person using two identifiers.  Patient location:home My location: home.    Changed to audio during visit- poor connection.  I discussed the limitations, risks, security and privacy concerns of performing an evaluation and management service by telephone and the availability of in person appointments. I also discussed with the patient that there may be a patient responsible charge related to this service. The patient expressed understanding and agreed to proceed, consent obtained  Chief complaint:  Chief Complaint  Patient presents with  . Medical Management of Chronic Issues    med refills   . GI Problem    stomach pain going on a week-    History of Present Illness: Ruth [name] is a 39 y.o. female  Insomnia: History of psychophysiologic insomnia, previous sleep specialist eval, trial of melatonin, treatment with therapy, working on sleep phobia and anxiety.  Had continue to work on sleep hygiene, CBT, for her sleep/focus.  No daytime anxiety.  Had overall been controlled with alprazolam 0.5 mg nightly, and at April visit had weaned down to 0.4 mg, removing part of the tablet.  Normal anxiety screening in June. Controlled substance database (PDMP) reviewed. No concerns appreciated.  Last prescription for #30 filled on September 15, previously #30 on August 10, previously #30 on June 23.  Still taking near whole pill at night. Peeling small part off - more than 1/2 pill.  Sleeping ok through the night typical. Predictable, and work, school have been going well. In a routine.  Less frequent meetings with therapist as things are going well.   Abdominal pain Current symptoms past 1 week. Waking up with gnawing/hunger pains in middle of night. Dull ache during the day. Improves with eating.  No hx of PUD.  No  attempted treatments. On metamucil QAM.  No nsaids.  Had been traveling the week prior - [place]. Usually prepares meals at home. More restaurant food. No alcohol.  Min nausea (like when not eating). No vomiting, diarrhea, hematochezia, melena or new stool changes. BM QD. No recent constipation.  No lightheadedness.  Planned endoscopy and colonoscopy on 09/19/20.  Some ongoing reflux, but not feeling heartburn/burn feeling. Notes regurgitation feeling of food, not painful.   Previously had some difficulty with pain with defecation, anal fissures treated by Dr. Collene Mares years ago.  Lower abdominal symptoms for a few months when discussed in June.  Plan for meeting with new gastroenterologist at that time.     GAD 7 : Generalized Anxiety Score 04/11/2020 12/03/2018  Nervous, Anxious, on Edge 0 0  Control/stop worrying 0 1  Worry too much - different things 0 2  Trouble relaxing 0 2  Restless 0 0  Easily annoyed or irritable 0 0  Afraid - awful might happen 0 2  Total GAD 7 Score 0 7  Anxiety Difficulty - Somewhat difficult        Patient Active Problem List   Diagnosis Date Noted  . Nonallopathic lesion of rib cage 01/05/2020  . Nonallopathic lesion of sacral region 01/05/2020  . 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 Chest pain 12/02/2018  . Tachycardia 12/02/2018  . Chronic neck pain 12/29/2016  . Chronic pain syndrome 12/29/2016  . Nonallopathic lesion of cervical region 12/29/2016  . Nonallopathic lesion of thoracic region 12/29/2016  . Nonallopathic lesion of lumbosacral region 12/29/2016  .  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 HYPOTENSION 07/23/2007  . ACNE, MILD 07/23/2007   Past Medical History:  Diagnosis Date  . Allergy   . Arthritis   . Frequency-urgency sydrome   . GERD (gastroesophageal reflux disease)    no meds  . PONV (postoperative nausea and vomiting)    Past Surgical History:  Procedure Laterality Date  . LAPAROSCOPY  04/19/2012   Procedure: LAPAROSCOPY  DIAGNOSTIC;  Surgeon: Selinda Orion, MD;  Location: [facility];  Service: Gynecology;  Laterality: N/A;  left ovarian cystectomy with excision of BENIGN l cystic 7.6cm left teratoma WITH ENDO CATCH REMOVAL and cauterization of endometriosis  . nasal septoplasty  '11  . SKIN BIOPSY Left 09/05/2011   left breast- 0 tmt.   . WISDOM TOOTH EXTRACTION     Allergies  Allergen Reactions  . Lactose Intolerance (Gi) Hives    ANY MILK  BASED PROTEIN  . Whey Protein [Protein] Hives   Prior to Admission medications   Medication Sig Start Date End Date Taking? Authorizing Provider  ALPRAZolam [name]) 0.5 MG tablet Take 1 tablet (0.5 mg total) by mouth at bedtime as needed for anxiety. 04/11/20  Yes Wendie Agreste, MD  diltiazem 2 % GEL Apply 1 application topically 2 (two) times daily. Pea sized amount to perianal skin before bowel movement, up to twice per day as needed. 04/11/20  Yes Wendie Agreste, MD  psyllium (METAMUCIL) 58.6 % packet Take 1 packet by mouth daily.   Yes [provider]  gabapentin (NEURONTIN) 100 MG capsule Take 2 capsules (200 mg total) by mouth at bedtime. Patient not taking: Reported on 08/03/2020 02/14/20   Lyndal Pulley, DO   Social History   Socioeconomic History  . Marital status: Single    Spouse name: Not on file  . Number of children: Not on file  . Years of education: Not on file  . Highest education level: Not on file  Occupational History  . Not on file  Tobacco Use  . Smoking status: Former Smoker    Packs/day: 1.00    Quit date: 04/12/2004    Years since quitting: 16.3  . Smokeless tobacco: Never Used  Substance and Sexual Activity  . Alcohol use: Yes    Comment: social  . Drug use: No  . Sexual activity: Yes  Other Topics Concern  . Not on file  Social History Narrative   Lives with partner.     Social Determinants of Health   Financial Resource Strain:   . Difficulty of Paying Living Expenses: Not on file  Food  Insecurity:   . Worried About Charity fundraiser in the Last Year: Not on file  . Ran Out of Food in the Last Year: Not on file  Transportation Needs:   . Lack of Transportation (Medical): Not on file  . Lack of Transportation (Non-Medical): Not on file  Physical Activity:   . Days of Exercise per Week: Not on file  . Minutes of Exercise per Session: Not on file  Stress:   . Feeling of Stress : Not on file  Social Connections:   . Frequency of Communication with Friends and Family: Not on file  . Frequency of Social Gatherings with Friends and Family: Not on file  . Attends Religious Services: Not on file  . Active Member of Clubs or Organizations: Not on file  . Attends Archivist Meetings: Not on file  . Marital Status: Not on  file  Intimate Partner Violence:   . Fear of Current or Ex-Partner: Not on file  . Emotionally Abused: Not on file  . Physically Abused: Not on file  . Sexually Abused: Not on file    Observations/Objective: Vitals:   08/03/20 0939  Weight: 138 lb (62.6 kg)  Height: 5\' 4"  (1.626 m)  Nontoxic appearance on video, all questions were answered with understanding expressed.  RTC/ER precautions given.   Assessment and Plan: Psychophysiological insomnia  -Overall stable with alprazolam, less than 0.5 mg daily.  Previous attempted treatments were not effective.  Appropriate use of medication.  -continue to meet with counselor regarding sleep phobias, trial of lower dose as tolerated.  Recheck in 6 months.  Abdominal pain, unspecified abdominal location - Plan: omeprazole (PRILOSEC) 20 MG capsule Gastroesophageal reflux disease, unspecified whether esophagitis present - Plan: omeprazole (PRILOSEC) 20 MG capsule  -1 week of symptoms, noted after travel, potentially could be foodborne illness but without diarrhea/vomiting, less likely.  Differential includes gastritis versus peptic ulcer disease without stool changes or signs of bleeding at this  time.  -Start omeprazole 20 mg twice daily, follow-up if not improving in the next week, ER/RTC precautions given as well as follow-up with gastroenterology if symptoms persist.  Has scheduled follow-up endoscopy, colonoscopy in approximately 6 weeks.   Follow Up Instructions: RTC precautions regarding abdominal pain, otherwise 6 months for med review   I discussed the assessment and treatment plan with the patient. The patient was provided an opportunity to ask questions and all were answered. The patient agreed with the plan and demonstrated an understanding of the instructions.   The patient was advised to call back or seek an in-person evaluation if the symptoms worsen or if the condition fails to improve as anticipated.  I provided 28 minutes of non-face-to-face time during this encounter. 4 min chart review/completion.    Wendie Agreste, MD

## 2020-08-03 NOTE — Patient Instructions (Addendum)
   Ok to continue alprazolam at this time, but continue to work with therapist and try to wean alprazolam to 1/2 pill if tolerated. Follow up in next 6 months to review this medicine.   Okay to try omeprazole twice per day before meals, but if that is not helping your stomach symptoms in the next week, I would recommend evaluation through our office or with gastroenterology.  If any worsening symptoms including worsening pain, change in stools, dark stools, lightheadedness, or dizziness, be seen right away.  If you have lab work done today you will be contacted with your lab results within the next 2 weeks.  If you have not heard from [place] then please contact us. The fastest way to get your results is to register for My Chart.   IF you received an x-ray today, you will receive an invoice from [facility]. Please contact [facility] at [phone] with questions or concerns regarding your invoice.   IF you received labwork today, you will receive an invoice from [facility]. Please contact [facility] at [phone] with questions or concerns regarding your invoice.   Our billing staff will not be able to assist you with questions regarding bills from these companies.  You will be contacted with the lab results as soon as they are available. The fastest way to get your results is to activate your My Chart account. Instructions are located on the last page of this paperwork. If you have not heard from [place] regarding the results in 2 weeks, please contact this office.

## 2020-08-08 DIAGNOSIS — L680 Hirsutism: Secondary | ICD-10-CM | POA: Diagnosis not present

## 2020-08-08 DIAGNOSIS — Z1231 Encounter for screening mammogram for malignant neoplasm of breast: Secondary | ICD-10-CM | POA: Diagnosis not present

## 2020-08-08 DIAGNOSIS — Z1151 Encounter for screening for human papillomavirus (HPV): Secondary | ICD-10-CM | POA: Diagnosis not present

## 2020-08-08 DIAGNOSIS — E282 Polycystic ovarian syndrome: Secondary | ICD-10-CM | POA: Diagnosis not present

## 2020-08-08 DIAGNOSIS — R1031 Right lower quadrant pain: Secondary | ICD-10-CM | POA: Diagnosis not present

## 2020-08-08 DIAGNOSIS — Z130 Encounter for screening for diseases of the blood and blood-forming organs and certain disorders involving the immune mechanism: Secondary | ICD-10-CM | POA: Diagnosis not present

## 2020-08-08 DIAGNOSIS — Z124 Encounter for screening for malignant neoplasm of cervix: Secondary | ICD-10-CM | POA: Diagnosis not present

## 2020-08-08 DIAGNOSIS — Z01419 Encounter for gynecological examination (general) (routine) without abnormal findings: Secondary | ICD-10-CM | POA: Diagnosis not present

## 2020-08-09 ENCOUNTER — Telehealth: Payer: Self-pay | Admitting: Family Medicine

## 2020-08-09 DIAGNOSIS — L680 Hirsutism: Secondary | ICD-10-CM | POA: Diagnosis not present

## 2020-08-09 DIAGNOSIS — R1031 Right lower quadrant pain: Secondary | ICD-10-CM | POA: Diagnosis not present

## 2020-08-09 NOTE — Telephone Encounter (Signed)
08/09/2020 - PATIENT HAD A MY-CHART APPOINTMENT WITH DR. Carlota Raspberry ON Friday (08/03/2020). DR. Carlota Raspberry HAS REQUESTED SHE FOLLOW-UP WITH HIM IN 6 MONTHS (April 2022) FOR HER MEDICATION REVIEW. I TRIED TO CALL AND SCHEDULE BUT I HAD TO LEAVE HER A VOICE MAIL TO RETURN MY CALL. [place]

## 2020-10-08 ENCOUNTER — Other Ambulatory Visit: Payer: Self-pay | Admitting: Family Medicine

## 2020-10-08 DIAGNOSIS — F5104 Psychophysiologic insomnia: Secondary | ICD-10-CM

## 2020-10-08 NOTE — Telephone Encounter (Signed)
Patient is requesting a refill of the following medications: Requested Prescriptions   Pending Prescriptions Disp Refills   ALPRAZolam (XANAX) 0.5 MG tablet [Pharmacy Med Name: ALPRAZOLAM 0.5 MG TABLET] 30 tablet 2    Sig: TAKE 1 TABLET BY MOUTH AT BEDTIME AS NEEDED FOR ANXIETY.    Date of patient request: 10/08/20 Last office visit: 08/03/20 video visit  Date of last refill: 6/ 23/21 Last refill amount: 30 +2 Follow up time period per chart: none yet

## 2020-10-08 NOTE — Telephone Encounter (Signed)
Controlled substance database (PDMP) reviewed. No concerns appreciated.  Last filled 07/04/2020.  Refill granted.

## 2020-10-08 NOTE — Telephone Encounter (Signed)
Requested medication (s) are due for refill today: yes  Requested medication (s) are on the active medication list: yes  Last refill:  07/04/2020  Future visit scheduled: no  Notes to clinic: this refill cannot be delegated    Requested Prescriptions  Pending Prescriptions Disp Refills   ALPRAZolam (XANAX) 0.5 MG tablet [Pharmacy Med Name: ALPRAZOLAM 0.5 MG TABLET] 30 tablet 2    Sig: TAKE 1 TABLET BY MOUTH AT BEDTIME AS NEEDED FOR ANXIETY.      Not Delegated - Psychiatry:  Anxiolytics/Hypnotics Failed - 10/08/2020 11:11 AM      Failed - This refill cannot be delegated      Failed - Urine Drug Screen completed in last 360 days      Passed - Valid encounter within last 6 months    Recent Outpatient Visits           2 months ago Psychophysiological insomnia   [facility], Ranell Patrick, MD   6 months ago Anal fissure   [facility], Ranell Patrick, MD   8 months ago Psychophysiological insomnia   [facility], Ranell Patrick, MD   1 year ago Psychophysiological insomnia   [facility], Ranell Patrick, MD   1 year ago Psychophysiological insomnia   [facility], Ranell Patrick, MD

## 2020-11-01 ENCOUNTER — Ambulatory Visit: Payer: BC Managed Care – PPO | Admitting: Physician Assistant

## 2020-11-16 ENCOUNTER — Ambulatory Visit: Payer: BC Managed Care – PPO | Admitting: Physician Assistant

## 2020-11-28 ENCOUNTER — Encounter: Payer: Self-pay | Admitting: Family Medicine

## 2020-11-28 ENCOUNTER — Other Ambulatory Visit: Payer: Self-pay

## 2020-11-28 ENCOUNTER — Telehealth (INDEPENDENT_AMBULATORY_CARE_PROVIDER_SITE_OTHER): Payer: BC Managed Care – PPO | Admitting: Family Medicine

## 2020-11-28 VITALS — Ht 64.0 in | Wt 135.0 lb

## 2020-11-28 DIAGNOSIS — J22 Unspecified acute lower respiratory infection: Secondary | ICD-10-CM | POA: Diagnosis not present

## 2020-11-28 DIAGNOSIS — R059 Cough, unspecified: Secondary | ICD-10-CM | POA: Diagnosis not present

## 2020-11-28 DIAGNOSIS — R062 Wheezing: Secondary | ICD-10-CM | POA: Diagnosis not present

## 2020-11-28 MED ORDER — AZITHROMYCIN 250 MG PO TABS: ORAL_TABLET | ORAL | 0 refills | Status: DC

## 2020-11-28 MED ORDER — ALBUTEROL SULFATE HFA 108 (90 BASE) MCG/ACT IN AERS: 1.0000 | INHALATION_SPRAY | RESPIRATORY_TRACT | 0 refills | Status: DC | PRN

## 2020-11-28 MED ORDER — BENZONATATE 100 MG PO CAPS: 100.0000 mg | ORAL_CAPSULE | Freq: Three times a day (TID) | ORAL | 0 refills | Status: DC | PRN

## 2020-11-28 NOTE — Progress Notes (Signed)
Virtual Visit via Video Note  I connected with Ruth [name] on 11/28/20 at 6:15 PM by a video enabled telemedicine application and verified that I am speaking with the correct person using two identifiers.  Patient location:home  My location: office.    I discussed the limitations, risks, security and privacy concerns of performing an evaluation and management service by telephone and the availability of in person appointments. I also discussed with the patient that there may be a patient responsible charge related to this service. The patient expressed understanding and agreed to proceed, consent obtained  Chief complaint:  Chief Complaint  Patient presents with  . Cough    Pt reports that 2 weeks ago she started having a cough and wheezing. Pt had a home covid test that came back negative. Pt reports sore throat and nasal drip. Pt has taken musinex which has helped some.     History of Present Illness: Ruth [name] is a 40 y.o. female   Cough: Sick about 2-2.5 weeks ago, low grade fever 101, runny nose,sore throat, sneezing, cough.  Home covid test negative, but only checked test few days ago not during illness.  Overall improved, with some residual postnasal drip. Cough at night, but some coughing fits during the day. Some wheeze with cough fits at time during the day.  No fever, no dyspnea. Home O2 sat 98-99.  Clear mucus.  Tx: mucinex - min relief with coughing at times, tea, honey. Cough at night at times, or wheeze at times. Possible mild asthma in past, has used albuterol in past if needed bt not daily.      Had covid vaccines and booster.    Patient Active Problem List   Diagnosis Date Noted  . Nonallopathic lesion of rib cage 01/05/2020  . Nonallopathic lesion of sacral region 01/05/2020  . 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 Chest pain 12/02/2018  . Tachycardia 12/02/2018  . Chronic neck pain 12/29/2016  . Chronic pain syndrome 12/29/2016  . Nonallopathic lesion of  cervical region 12/29/2016  . Nonallopathic lesion of thoracic region 12/29/2016  . Nonallopathic lesion of lumbosacral region 12/29/2016  . 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 HYPOTENSION 07/23/2007  . ACNE, MILD 07/23/2007   Past Medical History:  Diagnosis Date  . Allergy   . Arthritis   . Frequency-urgency sydrome   . GERD (gastroesophageal reflux disease)    no meds  . PONV (postoperative nausea and vomiting)    Past Surgical History:  Procedure Laterality Date  . LAPAROSCOPY  04/19/2012   Procedure: LAPAROSCOPY DIAGNOSTIC;  Surgeon: Selinda Orion, MD;  Location: [facility];  Service: Gynecology;  Laterality: N/A;  left ovarian cystectomy with excision of BENIGN l cystic 7.6cm left teratoma WITH ENDO CATCH REMOVAL and cauterization of endometriosis  . nasal septoplasty  '11  . SKIN BIOPSY Left 09/05/2011   left breast- 0 tmt.   . WISDOM TOOTH EXTRACTION     Allergies  Allergen Reactions  . Lactose Intolerance (Gi) Hives    ANY MILK  BASED PROTEIN  . Whey Protein [Protein] Hives   Prior to Admission medications   Medication Sig Start Date End Date Taking? Authorizing Provider  ALPRAZolam (XANAX) 0.5 MG tablet TAKE 1 TABLET BY MOUTH AT BEDTIME AS NEEDED FOR ANXIETY. 10/08/20  Yes Wendie Agreste, MD  diltiazem 2 % GEL Apply 1 application topically 2 (two) times daily. Pea sized amount to perianal skin before bowel movement, up to twice per day  as needed. 04/11/20  Yes Wendie Agreste, MD  omeprazole (PRILOSEC) 20 MG capsule Take 1 capsule (20 mg total) by mouth 2 (two) times daily before a meal. 08/03/20  Yes Wendie Agreste, MD  psyllium (METAMUCIL) 58.6 % packet Take 1 packet by mouth daily.   Yes [provider]   Social History   Socioeconomic History  . Marital status: Single    Spouse name: Not on file  . Number of children: Not on file  . Years of education: Not on file  . Highest education level: Not on file  Occupational  History  . Not on file  Tobacco Use  . Smoking status: Former Smoker    Packs/day: 1.00    Quit date: 04/12/2004    Years since quitting: 16.6  . Smokeless tobacco: Never Used  Substance and Sexual Activity  . Alcohol use: Yes    Comment: social  . Drug use: No  . Sexual activity: Yes  Other Topics Concern  . Not on file  Social History Narrative   Lives with partner.     Social Determinants of Health   Financial Resource Strain: Not on file  Food Insecurity: Not on file  Transportation Needs: Not on file  Physical Activity: Not on file  Stress: Not on file  Social Connections: Not on file  Intimate Partner Violence: Not on file    Observations/Objective: Vitals:   11/28/20 1557  Weight: 135 lb (61.2 kg)  Height: 5\' 4"  (1.626 m)   No distress over video, nontoxic appearance.  Euthymic mood.  Speaking in full sentences without distress/respiratory distress.   Assessment and Plan: Cough - Plan: benzonatate (TESSALON) 100 MG capsule  Wheezing - Plan: albuterol (VENTOLIN HFA) 108 (90 Base) MCG/ACT inhaler  LRTI (lower respiratory tract infection) - Plan: azithromycin (ZITHROMAX) 250 MG tablet  Viral illness few weeks ago, differential includes COVID-19 but symptoms have overall improved.  Some persistent cough with bronchospastic component.  No dyspnea, home O2 sats reassuring and nontoxic appearance on video.  Differential includes lower respiratory tract infection/bronchitis.  -Tessalon Perles 3 times daily as needed for cough.  -Albuterol up to every 4-6 hours as needed for wheezing, RTC precautions discussed if increased need or persistent need.  -If not improving through the week, or more discolored phlegm can start azithromycin.  Placed on hold for now.  ER/urgent care precautions discussed  Follow Up Instructions: As needed   I discussed the assessment and treatment plan with the patient. The patient was provided an opportunity to ask questions and all were  answered. The patient agreed with the plan and demonstrated an understanding of the instructions.   The patient was advised to call back or seek an in-person evaluation if the symptoms worsen or if the condition fails to improve as anticipated.  I provided 14 minutes of non-face-to-face time during this encounter.   Wendie Agreste, MD

## 2020-11-28 NOTE — Patient Instructions (Addendum)
   Tessalon Perles up to 3 times per day as needed for cough.  Albuterol inhaler up to every 4-6 hours if needed for wheezing, but if you require that medicine sooner than every 4 hours or persistently need to use that medicine frequently for more than a few days, I do recommend follow-up visit either with [place], urgent care or the ER if needed.  If cough does not continue to improve into next week, I did write for an antibiotic but that was placed on hold for now.  Let me know if there are questions and take care.    If you have lab work done today you will be contacted with your lab results within the next 2 weeks.  If you have not heard from [place] then please contact us. The fastest way to get your results is to register for My Chart.   IF you received an x-ray today, you will receive an invoice from [facility]. Please contact [facility] at [phone] with questions or concerns regarding your invoice.   IF you received labwork today, you will receive an invoice from [place] [facility]. Please contact [facility] at [phone] with questions or concerns regarding your invoice.   Our billing staff will not be able to assist you with questions regarding bills from these companies.  You will be contacted with the lab results as soon as they are available. The fastest way to get your results is to activate your My Chart account. Instructions are located on the last page of this paperwork. If you have not heard from [place] regarding the results in 2 weeks, please contact this office.

## 2021-04-16 ENCOUNTER — Encounter: Payer: Self-pay | Admitting: Physician Assistant

## 2021-04-16 ENCOUNTER — Ambulatory Visit: Payer: BC Managed Care – PPO | Admitting: Physician Assistant

## 2021-04-16 ENCOUNTER — Other Ambulatory Visit: Payer: Self-pay

## 2021-04-16 DIAGNOSIS — L720 Epidermal cyst: Secondary | ICD-10-CM | POA: Diagnosis not present

## 2021-04-16 DIAGNOSIS — Z1283 Encounter for screening for malignant neoplasm of skin: Secondary | ICD-10-CM | POA: Diagnosis not present

## 2021-04-16 DIAGNOSIS — L821 Other seborrheic keratosis: Secondary | ICD-10-CM | POA: Diagnosis not present

## 2021-04-16 NOTE — Progress Notes (Signed)
   Follow-Up Visit   Subjective  Ruth [name] is a 40 y.o. female who presents for the following: Annual Exam (Patient here today for yearly skin check. Patient would like a lesion above her left eyebrow check x year no bleeding, just looks different per patient. Also check lesion on right forearm x years per patient it is bleeding. Personal history of atypical mole. No personal history of melanoma or non mole skin cancer. No family history of atypical moles, melanoma or non mole skin cancer. ).   The following portions of the chart were reviewed this encounter and updated as appropriate:  Tobacco  Allergies  Meds  Problems  Med Hx  Surg Hx  Fam Hx       Objective  Well appearing patient in no apparent distress; mood and affect are within normal limits.  A full examination was performed including scalp, head, eyes, ears, nose, lips, neck, chest, axillae, abdomen, back, buttocks, bilateral upper extremities, bilateral lower extremities, hands, feet, fingers, toes, fingernails, and toenails. All findings within normal limits unless otherwise noted below.  Left Eyebrow Stuck-on, waxy plaque.   Left Nasal Sidewall White papule   Assessment & Plan  Seborrheic keratosis Left Eyebrow  Benign no treatment needed.   Milia Left Nasal Sidewall  Benign no treatment needed.     I, Khaidyn Staebell, PA-C, have reviewed all documentation's for this visit.  The documentation on 04/16/21 for the exam, diagnosis, procedures and orders are all accurate and complete.

## 2021-04-30 ENCOUNTER — Other Ambulatory Visit: Payer: Self-pay | Admitting: Family Medicine

## 2021-04-30 DIAGNOSIS — F5104 Psychophysiologic insomnia: Secondary | ICD-10-CM

## 2021-04-30 NOTE — Telephone Encounter (Signed)
Last filled 10/08/20 #30 with 2 refills Last visit 11/28/20 Next visit 05/06/21

## 2021-04-30 NOTE — Telephone Encounter (Signed)
Controlled substance database (PDMP) reviewed. No concerns appreciated.  Last filled 01/07/21. Has upcoming appointment.  Refill ordered.

## 2021-05-06 ENCOUNTER — Encounter: Payer: BC Managed Care – PPO | Admitting: Family Medicine

## 2021-05-23 DIAGNOSIS — K6289 Other specified diseases of anus and rectum: Secondary | ICD-10-CM | POA: Diagnosis not present

## 2021-05-23 DIAGNOSIS — K219 Gastro-esophageal reflux disease without esophagitis: Secondary | ICD-10-CM | POA: Diagnosis not present

## 2021-05-23 DIAGNOSIS — K5909 Other constipation: Secondary | ICD-10-CM | POA: Diagnosis not present

## 2021-05-23 DIAGNOSIS — K625 Hemorrhage of anus and rectum: Secondary | ICD-10-CM | POA: Diagnosis not present

## 2021-06-06 ENCOUNTER — Encounter: Payer: BC Managed Care – PPO | Admitting: Family Medicine

## 2021-06-21 ENCOUNTER — Encounter: Payer: Self-pay | Admitting: Registered Nurse

## 2021-06-21 ENCOUNTER — Ambulatory Visit (INDEPENDENT_AMBULATORY_CARE_PROVIDER_SITE_OTHER): Payer: BC Managed Care – PPO | Admitting: Registered Nurse

## 2021-06-21 ENCOUNTER — Other Ambulatory Visit: Payer: Self-pay

## 2021-06-21 VITALS — BP 122/80 | HR 81 | Temp 98.2°F | Resp 18 | Ht 65.0 in | Wt 142.6 lb

## 2021-06-21 DIAGNOSIS — Z13220 Encounter for screening for lipoid disorders: Secondary | ICD-10-CM | POA: Diagnosis not present

## 2021-06-21 DIAGNOSIS — Z13228 Encounter for screening for other metabolic disorders: Secondary | ICD-10-CM

## 2021-06-21 DIAGNOSIS — Z1329 Encounter for screening for other suspected endocrine disorder: Secondary | ICD-10-CM | POA: Diagnosis not present

## 2021-06-21 DIAGNOSIS — Z130 Encounter for screening for diseases of the blood and blood-forming organs and certain disorders involving the immune mechanism: Secondary | ICD-10-CM

## 2021-06-21 DIAGNOSIS — Z0000 Encounter for general adult medical examination without abnormal findings: Secondary | ICD-10-CM

## 2021-06-21 LAB — COMPREHENSIVE METABOLIC PANEL
ALT: 18 U/L (ref 0–35)
AST: 15 U/L (ref 0–37)
Albumin: 4 g/dL (ref 3.5–5.2)
Alkaline Phosphatase: 76 U/L (ref 39–117)
BUN: 11 mg/dL (ref 6–23)
CO2: 26 mEq/L (ref 19–32)
Calcium: 9.2 mg/dL (ref 8.4–10.5)
Chloride: 105 mEq/L (ref 96–112)
Creatinine, Ser: 0.6 mg/dL (ref 0.40–1.20)
GFR: 111.46 mL/min (ref >60.00)
Glucose, Bld: 81 mg/dL (ref 70–99)
Potassium: 3.8 mEq/L (ref 3.5–5.1)
Sodium: 138 mEq/L (ref 135–145)
Total Bilirubin: 0.8 mg/dL (ref 0.2–1.2)
Total Protein: 6.9 g/dL (ref 6.0–8.3)

## 2021-06-21 LAB — CBC WITH DIFFERENTIAL/PLATELET
Basophils Absolute: 0 10*3/uL (ref 0.0–0.1)
Basophils Relative: 0.5 % (ref 0.0–3.0)
Eosinophils Absolute: 0.1 10*3/uL (ref 0.0–0.7)
Eosinophils Relative: 1.4 % (ref 0.0–5.0)
HCT: 38.4 % (ref 36.0–46.0)
Hemoglobin: 12.7 g/dL (ref 12.0–15.0)
Lymphocytes Relative: 33 % (ref 12.0–46.0)
Lymphs Abs: 2.3 10*3/uL (ref 0.7–4.0)
MCHC: 33.2 g/dL (ref 30.0–36.0)
MCV: 84.4 fl (ref 78.0–100.0)
Monocytes Absolute: 0.6 10*3/uL (ref 0.1–1.0)
Monocytes Relative: 8.9 % (ref 3.0–12.0)
Neutro Abs: 4 10*3/uL (ref 1.4–7.7)
Neutrophils Relative %: 56.2 % (ref 43.0–77.0)
Platelets: 230 10*3/uL (ref 150.0–400.0)
RBC: 4.55 Mil/uL (ref 3.87–5.11)
RDW: 13.4 % (ref 11.5–15.5)
WBC: 7.1 10*3/uL (ref 4.0–10.5)

## 2021-06-21 LAB — LIPID PANEL
Cholesterol: 143 mg/dL (ref 0–200)
HDL: 58.4 mg/dL (ref >39.00)
LDL Cholesterol: 68 mg/dL (ref 0–99)
NonHDL: 84.22
Total CHOL/HDL Ratio: 2
Triglycerides: 83 mg/dL (ref 0.0–149.0)
VLDL: 16.6 mg/dL (ref 0.0–40.0)

## 2021-06-21 LAB — HEMOGLOBIN A1C: Hgb A1c MFr Bld: 5.3 % (ref 4.6–6.5)

## 2021-06-21 LAB — TSH: TSH: 1.02 u[IU]/mL (ref 0.35–5.50)

## 2021-06-21 NOTE — Progress Notes (Signed)
Established Patient Office Visit  Subjective:  Patient ID: Ruth [name], female    DOB: 05-15-1981  Age: 40 y.o. MRN: [id number]  CC:  Chief Complaint  Patient presents with   Annual Exam    HPI Ruth [name] presents for CPE  No acute concerns  Histories reviewed and updated with patient.   Health Maintenance: Due for tdap, flu, covid, and pneumonia vaccine Due for pap Due for hep C screening - amenable to this today.  Past Medical History:  Diagnosis Date   Allergy    Arthritis    Atypical mole 08/26/2011   Left Breast (mild)   Frequency-urgency sydrome    GERD (gastroesophageal reflux disease)    no meds   PONV (postoperative nausea and vomiting)     Past Surgical History:  Procedure Laterality Date   LAPAROSCOPY  04/19/2012   Procedure: LAPAROSCOPY DIAGNOSTIC;  Surgeon: Selinda Orion, MD;  Location: [facility];  Service: Gynecology;  Laterality: N/A;  left ovarian cystectomy with excision of BENIGN l cystic 7.6cm left teratoma WITH ENDO CATCH REMOVAL and cauterization of endometriosis   nasal septoplasty  '11   SKIN BIOPSY Left 09/05/2011   left breast- 0 tmt.    WISDOM TOOTH EXTRACTION      Family History  Problem Relation Age of Onset   Stroke Father    Cancer Maternal Grandmother    Diabetes Maternal Grandmother     Social History   Socioeconomic History   Marital status: Single    Spouse name: Not on file   Number of children: Not on file   Years of education: Not on file   Highest education level: Not on file  Occupational History   Not on file  Tobacco Use   Smoking status: Former    Packs/day: 1.00    Types: Cigarettes    Quit date: 04/12/2004    Years since quitting: 17.2   Smokeless tobacco: Never  Vaping Use   Vaping Use: Never used  Substance and Sexual Activity   Alcohol use: Yes    Comment: social   Drug use: No   Sexual activity: Yes  Other Topics Concern   Not on file  Social History Narrative   Lives  with partner.     Social Determinants of Health   Financial Resource Strain: Not on file  Food Insecurity: Not on file  Transportation Needs: Not on file  Physical Activity: Not on file  Stress: Not on file  Social Connections: Not on file  Intimate Partner Violence: Not on file    Outpatient Medications Prior to Visit  Medication Sig Dispense Refill   albuterol (VENTOLIN HFA) 108 (90 Base) MCG/ACT inhaler Inhale 1-2 puffs into the lungs every 4 (four) hours as needed for wheezing or shortness of breath. 1 each 0   ALPRAZolam (XANAX) 0.5 MG tablet TAKE 1 TABLET BY MOUTH AT BEDTIME AS NEEDED FOR ANXIETY 30 tablet 0   No facility-administered medications prior to visit.    Allergies  Allergen Reactions   Lactose Intolerance (Gi) Hives    ANY MILK  BASED PROTEIN   Whey Protein [Protein] Hives    ROS Review of Systems  Constitutional: Negative.   HENT: Negative.    Eyes: Negative.   Respiratory: Negative.    Cardiovascular: Negative.   Gastrointestinal: Negative.   Endocrine: Negative.   Genitourinary: Negative.   Musculoskeletal: Negative.   Skin: Negative.   Allergic/Immunologic: Negative.   Neurological: Negative.   Hematological:  Negative.   Psychiatric/Behavioral: Negative.    All other systems reviewed and are negative.    Objective:    Physical Exam Vitals and nursing note reviewed.  Constitutional:      General: She is not in acute distress.    Appearance: Normal appearance. She is normal weight. She is not ill-appearing, toxic-appearing or diaphoretic.  HENT:     Head: Normocephalic and atraumatic.     Right Ear: Tympanic membrane, ear canal and external ear normal. There is no impacted cerumen.     Left Ear: Tympanic membrane, ear canal and external ear normal. There is no impacted cerumen.     Nose: Nose normal. No congestion or rhinorrhea.     Mouth/Throat:     Mouth: Mucous membranes are moist.     Pharynx: Oropharynx is clear. No oropharyngeal  exudate or posterior oropharyngeal erythema.  Eyes:     General: No scleral icterus.       Right eye: No discharge.        Left eye: No discharge.     Extraocular Movements: Extraocular movements intact.     Conjunctiva/sclera: Conjunctivae normal.     Pupils: Pupils are equal, round, and reactive to light.  Cardiovascular:     Rate and Rhythm: Normal rate and regular rhythm.     Pulses: Normal pulses.     Heart sounds: Normal heart sounds. No murmur heard.   No friction rub. No gallop.  Pulmonary:     Effort: Pulmonary effort is normal. No respiratory distress.     Breath sounds: Normal breath sounds. No stridor. No wheezing, rhonchi or rales.  Chest:     Chest wall: No tenderness.  Abdominal:     General: Abdomen is flat. Bowel sounds are normal. There is no distension.     Palpations: Abdomen is soft. There is no mass.     Tenderness: There is no abdominal tenderness. There is no right CVA tenderness, left CVA tenderness, guarding or rebound.     Hernia: No hernia is present.  Musculoskeletal:        General: No swelling, tenderness, deformity or signs of injury. Normal range of motion.     Right lower leg: No edema.     Left lower leg: No edema.  Skin:    General: Skin is warm and dry.     Capillary Refill: Capillary refill takes less than 2 seconds.     Coloration: Skin is not jaundiced or pale.     Findings: No bruising, erythema, lesion or rash.  Neurological:     General: No focal deficit present.     Mental Status: She is alert and oriented to person, place, and time. Mental status is at baseline.     Cranial Nerves: No cranial nerve deficit.     Sensory: No sensory deficit.     Motor: No weakness.     Coordination: Coordination normal.     Gait: Gait normal.     Deep Tendon Reflexes: Reflexes normal.  Psychiatric:        Mood and Affect: Mood normal.        Behavior: Behavior normal.        Thought Content: Thought content normal.        Judgment: Judgment  normal.    BP 122/80   Pulse 81   Temp 98.2 F (36.8 C) (Temporal)   Resp 18   Ht '5\' 5"'$  (1.651 m)   Wt 142 lb 9.6 oz (64.7 kg)  SpO2 99%   BMI 23.73 kg/m  Wt Readings from Last 3 Encounters:  06/21/21 142 lb 9.6 oz (64.7 kg)  11/28/20 135 lb (61.2 kg)  08/03/20 138 lb (62.6 kg)     Health Maintenance Due  Topic Date Due   Pneumococcal Vaccine 39-42 Years old (1 - PCV) Never done   TETANUS/TDAP  Never done   COVID-19 Vaccine (3 - Pfizer risk series) 10/12/2020   INFLUENZA VACCINE  05/20/2021    There are no preventive care reminders to display for this patient.  Lab Results  Component Value Date   TSH 0.826 05/21/2017   Lab Results  Component Value Date   WBC 7.3 05/21/2017   HGB 15.0 08/12/2018   HCT 44.0 08/12/2018   MCV 87 05/21/2017   PLT 275 05/21/2017   Lab Results  Component Value Date   NA 140 08/12/2018   K 3.8 08/12/2018   CO2 22 05/04/2017   GLUCOSE 88 08/12/2018   BUN 8 08/12/2018   CREATININE 0.60 08/12/2018   BILITOT 1.0 05/04/2017   ALKPHOS 63 05/04/2017   AST 19 05/04/2017   ALT 18 05/04/2017   PROT 8.0 05/04/2017   ALBUMIN 4.4 05/04/2017   CALCIUM 9.6 05/04/2017   ANIONGAP 9 05/04/2017   Lab Results  Component Value Date   CHOL 151 01/12/2012   Lab Results  Component Value Date   HDL 54 01/12/2012   Lab Results  Component Value Date   LDLCALC 78 01/12/2012   Lab Results  Component Value Date   TRIG 95 01/12/2012   Lab Results  Component Value Date   CHOLHDL 2.8 01/12/2012   No results found for: HGBA1C    Assessment & Plan:   Problem List Items Addressed This Visit   None Visit Diagnoses     Annual physical exam    -  Primary   Screening for endocrine, metabolic and immunity disorder       Relevant Orders   CBC with Differential/Platelet   Hemoglobin A1c   Comprehensive metabolic panel   TSH   Lipid screening       Relevant Orders   Lipid panel       No orders of the defined types were placed in this  encounter.   Follow-up: Return in about 1 year (around 06/21/2022) for CPE and labs.   PLAN Exam unremarkable Labs collected. Will follow up with the patient as warranted. Return annually, sooner with concerns or as labs indicate Patient encouraged to call clinic with any questions, comments, or concerns.  Maximiano Coss, NP

## 2021-06-21 NOTE — Patient Instructions (Signed)
Ms. Darocha -   Good to meet you  Let me know if there are concerns  Exam unremarkable today - good news!  Labs will be back in the next day or two - I'll call with urgent concerns  Thanks  Rich

## 2021-07-01 DIAGNOSIS — K2210 Ulcer of esophagus without bleeding: Secondary | ICD-10-CM | POA: Diagnosis not present

## 2021-07-01 DIAGNOSIS — K449 Diaphragmatic hernia without obstruction or gangrene: Secondary | ICD-10-CM | POA: Diagnosis not present

## 2021-07-01 DIAGNOSIS — K219 Gastro-esophageal reflux disease without esophagitis: Secondary | ICD-10-CM | POA: Diagnosis not present

## 2021-07-01 DIAGNOSIS — K625 Hemorrhage of anus and rectum: Secondary | ICD-10-CM | POA: Diagnosis not present

## 2021-07-01 DIAGNOSIS — K317 Polyp of stomach and duodenum: Secondary | ICD-10-CM | POA: Diagnosis not present

## 2021-07-01 DIAGNOSIS — K2289 Other specified disease of esophagus: Secondary | ICD-10-CM | POA: Diagnosis not present

## 2021-07-01 DIAGNOSIS — K648 Other hemorrhoids: Secondary | ICD-10-CM | POA: Diagnosis not present

## 2021-07-01 DIAGNOSIS — R12 Heartburn: Secondary | ICD-10-CM | POA: Diagnosis not present

## 2021-07-01 DIAGNOSIS — K6289 Other specified diseases of anus and rectum: Secondary | ICD-10-CM | POA: Diagnosis not present

## 2021-07-01 DIAGNOSIS — D120 Benign neoplasm of cecum: Secondary | ICD-10-CM | POA: Diagnosis not present

## 2021-07-01 DIAGNOSIS — K2930 Chronic superficial gastritis without bleeding: Secondary | ICD-10-CM | POA: Diagnosis not present

## 2021-07-01 DIAGNOSIS — Z800 Family history of malignant neoplasm of digestive organs: Secondary | ICD-10-CM | POA: Diagnosis not present

## 2021-07-03 ENCOUNTER — Ambulatory Visit: Payer: BC Managed Care – PPO | Admitting: Physician Assistant

## 2021-07-07 ENCOUNTER — Other Ambulatory Visit: Payer: Self-pay | Admitting: Family Medicine

## 2021-07-07 DIAGNOSIS — F5104 Psychophysiologic insomnia: Secondary | ICD-10-CM

## 2021-07-09 ENCOUNTER — Other Ambulatory Visit: Payer: Self-pay

## 2021-07-09 ENCOUNTER — Ambulatory Visit: Payer: BC Managed Care – PPO | Admitting: Physician Assistant

## 2021-07-09 ENCOUNTER — Encounter: Payer: Self-pay | Admitting: Physician Assistant

## 2021-07-09 DIAGNOSIS — L700 Acne vulgaris: Secondary | ICD-10-CM

## 2021-07-09 DIAGNOSIS — L738 Other specified follicular disorders: Secondary | ICD-10-CM

## 2021-07-09 NOTE — Telephone Encounter (Signed)
Patient is requesting a refill of the following medications: Requested Prescriptions   Pending Prescriptions Disp Refills   ALPRAZolam (XANAX) 0.5 MG tablet [Pharmacy Med Name: ALPRAZOLAM 0.5 MG TABLET] 30 tablet 0    Sig: TAKE 1 TABLET BY MOUTH AT BEDTIME AS NEEDED FOR ANXIETY    Date of patient request: 07/07/21 Last office visit: 06/21/21 Date of last refill: 04/30/21 Last refill amount: 30  Saw Rich last

## 2021-07-09 NOTE — Progress Notes (Signed)
   Follow-Up Visit   Subjective  Ruth [name] is a 40 y.o. female who presents for the following: Skin Problem (Here to have lesion checked on forehead x month no itch/bleed but it bothers patient. ).   The following portions of the chart were reviewed this encounter and updated as appropriate:  Tobacco  Allergies  Meds  Problems  Med Hx  Surg Hx  Fam Hx      Objective  Well appearing patient in no apparent distress; mood and affect are within normal limits.  A focused examination was performed including face. Relevant physical exam findings are noted in the Assessment and Plan.  Mid Forehead Apparent indentation on the left side of her forehead. Increased vasculature and sun induced hyperpigmentation.  Head - Anterior (Face) Erythematous papules and pustules.  Assessment & Plan  Sebaceous hyperplasia Mid Forehead  Observe for changes. Suggested laser treatment for all of her facial ectasias and hyperplasia.  Acne vulgaris Head - Anterior (Face)  Laser treatment. She couldn't tolerate isotretinoin.    I, Tomer Chalmers, PA-C, have reviewed all documentation's for this visit.  The documentation on 07/09/21 for the exam, diagnosis, procedures and orders are all accurate and complete.

## 2021-07-09 NOTE — Patient Instructions (Signed)
[facility] [id number]

## 2021-07-09 NOTE — Telephone Encounter (Signed)
Controlled substance database (PDMP) reviewed. No concerns appreciated. Last filled in July, reordered.

## 2021-09-03 ENCOUNTER — Ambulatory Visit: Payer: BC Managed Care – PPO | Admitting: Physician Assistant

## 2021-09-03 ENCOUNTER — Other Ambulatory Visit: Payer: Self-pay | Admitting: Physician Assistant

## 2021-09-03 ENCOUNTER — Other Ambulatory Visit: Payer: Self-pay

## 2021-09-03 DIAGNOSIS — L905 Scar conditions and fibrosis of skin: Secondary | ICD-10-CM

## 2021-09-03 DIAGNOSIS — D485 Neoplasm of uncertain behavior of skin: Secondary | ICD-10-CM

## 2021-09-03 NOTE — Patient Instructions (Signed)

## 2021-09-04 ENCOUNTER — Encounter: Payer: Self-pay | Admitting: Physician Assistant

## 2021-09-04 NOTE — Progress Notes (Signed)
   Follow-Up Visit   Subjective  Ruth [name] is a 40 y.o. female who presents for the following: Skin Problem (Here to follow up on patients forehead. Not any better. ).   The following portions of the chart were reviewed this encounter and updated as appropriate:  Tobacco  Allergies  Meds  Problems  Med Hx  Surg Hx  Fam Hx      Objective  Well appearing patient in no apparent distress; mood and affect are within normal limits.  A focused examination was performed including face. Relevant physical exam findings are noted in the Assessment and Plan.  Mid Forehead Hypopigmented indention   Assessment & Plan  Neoplasm of uncertain behavior of skin Mid Forehead  Skin / nail biopsy Type of biopsy: punch   Informed consent: discussed and consent obtained   Procedure prep:  Patient was prepped and draped in usual sterile fashion (nonsterile) Prep type:  Chlorhexidine Anesthesia: the lesion was anesthetized in a standard fashion   Anesthetic:  1% lidocaine w/ epinephrine 1-100,000 local infiltration Punch size:  4 mm Suture size:  5-0 Suture type: nylon   Suture removal (days):  7 Outcome: patient tolerated procedure well   Post-procedure details: wound care instructions given   Post-procedure details comment:  Nonsterile  Specimen 1 - Surgical pathology Differential Diagnosis: morphea  Check Margins: No    I, Mallarie Voorhies, PA-C, have reviewed all documentation's for this visit.  The documentation on 09/04/21 for the exam, diagnosis, procedures and orders are all accurate and complete.

## 2022-01-22 ENCOUNTER — Other Ambulatory Visit: Payer: Self-pay | Admitting: Family Medicine

## 2022-01-22 DIAGNOSIS — F5104 Psychophysiologic insomnia: Secondary | ICD-10-CM

## 2022-01-22 MED ORDER — ALPRAZOLAM 0.5 MG PO TABS: ORAL_TABLET | ORAL | 0 refills | Status: DC

## 2022-01-22 NOTE — Telephone Encounter (Signed)
Controlled substance database reviewed.  Alprazolam 0.5 mg #30 filled 07/09/2021.  No concerns.  Due for office visit to discuss medication, I do see that she had an appointment for a physical with my colleague in September of last year.  Will refill temporarily, and request appointment. ?Initial printed prescription was shredded, reordered to pharmacy electronic ?

## 2022-01-22 NOTE — Telephone Encounter (Signed)
Patient is requesting a refill of the following medications: ?Requested Prescriptions  ? ?Pending Prescriptions Disp Refills  ? ALPRAZolam (XANAX) 0.5 MG tablet [Pharmacy Med Name: ALPRAZOLAM 0.5 MG TABLET] 30 tablet   ?  Sig: TAKE 1 TABLET BY MOUTH AT BEDTIME AS NEEDED FOR ANXIETY  ? ? ?Date of patient request: 01/22/2022 ?Last office visit: 06/21/2021 ?Date of last refill: 06/21/2021 ?Last refill amount: 30 tablets as needed  ?Follow up time period per chart:  ? ?

## 2022-01-23 NOTE — Telephone Encounter (Signed)
Sent a mychart message asking pt to call to schedule an appt  ?

## 2022-01-30 DIAGNOSIS — K601 Chronic anal fissure: Secondary | ICD-10-CM | POA: Diagnosis not present

## 2022-02-05 ENCOUNTER — Ambulatory Visit: Payer: BC Managed Care – PPO | Admitting: Family Medicine

## 2022-02-11 ENCOUNTER — Ambulatory Visit: Payer: Self-pay | Admitting: Physical Therapy

## 2022-02-20 ENCOUNTER — Ambulatory Visit (INDEPENDENT_AMBULATORY_CARE_PROVIDER_SITE_OTHER): Payer: 59 | Admitting: Family Medicine

## 2022-02-20 VITALS — BP 118/76 | HR 89 | Temp 98.2°F | Resp 16 | Ht 65.0 in | Wt 141.2 lb

## 2022-02-20 DIAGNOSIS — R69 Illness, unspecified: Secondary | ICD-10-CM | POA: Diagnosis not present

## 2022-02-20 DIAGNOSIS — J309 Allergic rhinitis, unspecified: Secondary | ICD-10-CM | POA: Diagnosis not present

## 2022-02-20 DIAGNOSIS — Z5181 Encounter for therapeutic drug level monitoring: Secondary | ICD-10-CM

## 2022-02-20 DIAGNOSIS — F5104 Psychophysiologic insomnia: Secondary | ICD-10-CM

## 2022-02-20 MED ORDER — ALPRAZOLAM 0.5 MG PO TABS: ORAL_TABLET | ORAL | 0 refills | Status: DC

## 2022-02-20 NOTE — Progress Notes (Signed)
? ?Subjective:  ?Patient ID: Ruth [name], female    DOB: 12-15-1980  Age: 41 y.o. MRN: [id number] ? ?CC:  ?Chief Complaint  ?Patient presents with  ? Anxiety  ?  Pt takes xanax prn to help her sleep, pt notes she is traveling later this year wanted to ask about protocol if she lost medication   ? Allergies  ?  Pt would also like OTC recommendation for none jittery allergy medication no need for prescription pt notes this is only if time allows   ? ? ?HPI ?Ruth [name] presents for  ? ?Psychophysiological insomnia. ?See prior attempted treatments.  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  She is worked on sleep hygiene, CBT for sleep and focus.  No daytime anxiety.  Ultimately has been controlled with alprazolam 0.5 mg nightly and she has weaned down to a slightly lower dose in the past.  Had removed part of the tablet. ?At last visit we discussed continuing to meet with a specialist/counselor regarding sleep phobias and lower dose of alprazolam as tolerated. ?Visit with Kathrin Ruddy on 06/21/21 for physical.   ?Still trying tylenol pm at times, but not effective. Still breaking small piece of 0.'5mg'$  off, sometimes closer to half pill, but not able to sleep with half pill. Needs about 0.3-0.'4mg'$ . no extra doses. No daytime anxiety. In a good place with meditation and CBT. Prior night terrors and sleep issues better. Online therapist once per month.  ?Last dose alprazolam last night.  ?Controlled substance database (PDMP) reviewed. No concerns appreciated. Last filled 01/22/22, prior 07/09/21- tried testing other options and lower doses that were not effective.  ?Will be out of country 9/18[place]10[place] - [place] trip.  ? ?Allergic rhinitis ?Saline wash only now. Does not like to take allergy meds as feels jittery - even with nasal spray.  ?More sinus congestion, sneezing, runny nose recently.  ?No recent need for albuterol.   ? ?History ?Patient Active Problem List  ? Diagnosis Date Noted  ? Nonallopathic lesion of rib cage 01/05/2020  ? Nonallopathic lesion of sacral region 01/05/2020  ? Frozen shoulder 01/05/2020  ? Dysphonia 07/05/2019  ? Otalgia of left ear 07/05/2019  ? Chest pain 12/02/2018  ? Tachycardia 12/02/2018  ? Tonsil asymmetry 04/14/2017  ? Chronic neck pain 12/29/2016  ? Chronic pain syndrome 12/29/2016  ? Nonallopathic lesion of cervical region 12/29/2016  ? Nonallopathic lesion of thoracic region 12/29/2016  ? Nonallopathic lesion of lumbosacral region 12/29/2016  ? Trigger index finger of left hand 04/03/2016  ? Rectal bleeding 03/18/2016  ? Rectal pain 03/18/2016  ? 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 HYPOTENSION 07/23/2007  ? ACNE, MILD 07/23/2007  ? ?Past Medical History:  ?Diagnosis Date  ? Allergy   ? Arthritis   ? Atypical mole 08/26/2011  ? Left Breast (mild)  ? Frequency-urgency sydrome   ? GERD (gastroesophageal reflux disease)   ? no meds  ? PONV (postoperative nausea and vomiting)   ? ?Past Surgical History:  ?Procedure Laterality Date  ? LAPAROSCOPY  04/19/2012  ? Procedure: LAPAROSCOPY DIAGNOSTIC;  Surgeon: Selinda Orion, MD;  Location: [facility];  Service: Gynecology;  Laterality: N/A;  left ovarian cystectomy with excision of BENIGN l cystic 7.6cm left teratoma WITH ENDO CATCH REMOVAL and cauterization of endometriosis  ? nasal septoplasty  '11  ? SKIN BIOPSY Left 09/05/2011  ? left breast- 0 tmt.   ?  WISDOM TOOTH EXTRACTION    ? ?Allergies  ?Allergen Reactions  ? Lactose Intolerance (Gi) Hives  ?  ANY MILK  BASED PROTEIN  ? Whey Protein [Protein] Hives  ? ?Prior to Admission medications   ?Medication Sig Start Date End Date Taking? Authorizing Provider  ?ALPRAZolam (XANAX) 0.5 MG tablet TAKE 1 TABLET BY MOUTH AT BEDTIME AS NEEDED FOR ANXIETY 01/22/22  Yes Wendie Agreste, MD  ?albuterol (VENTOLIN HFA) 108 (90 Base) MCG/ACT inhaler Inhale 1-2 puffs into the lungs every 4 (four) hours  as needed for wheezing or shortness of breath. 11/28/20   Wendie Agreste, MD  ?BISACODYL 5 MG EC tablet See admin instructions. 06/26/21   [provider]  ?esomeprazole (NEXIUM) 20 MG packet Take 20 mg by mouth daily before breakfast.    [provider]  ? ?Social History  ? ?Socioeconomic History  ? Marital status: Single  ?  Spouse name: Not on file  ? Number of children: Not on file  ? Years of education: Not on file  ? Highest education level: Not on file  ?Occupational History  ? Not on file  ?Tobacco Use  ? Smoking status: Former  ?  Packs/day: 1.00  ?  Types: Cigarettes  ?  Quit date: 04/12/2004  ?  Years since quitting: 17.8  ? Smokeless tobacco: Never  ?Vaping Use  ? Vaping Use: Never used  ?Substance and Sexual Activity  ? Alcohol use: Yes  ?  Comment: social  ? Drug use: No  ? Sexual activity: Yes  ?Other Topics Concern  ? Not on file  ?Social History Narrative  ? Lives with partner.    ? ?Social Determinants of Health  ? ?Financial Resource Strain: Not on file  ?Food Insecurity: Not on file  ?Transportation Needs: Not on file  ?Physical Activity: Not on file  ?Stress: Not on file  ?Social Connections: Not on file  ?Intimate Partner Violence: Not on file  ? ? ?Review of Systems ? ? ?Objective:  ? ?Vitals:  ? 02/20/22 1137  ?BP: 118/76  ?Pulse: 89  ?Resp: 16  ?Temp: 98.2 ?F (36.8 ?C)  ?TempSrc: Temporal  ?SpO2: 98%  ?Weight: 141 lb 3.2 oz (64 kg)  ?Height: '5\' 5"'$  (1.651 m)  ? ? ? ?Physical Exam ?Vitals reviewed.  ?Constitutional:   ?   General: She is not in acute distress. ?   Appearance: She is well-developed.  ?HENT:  ?   Head: Normocephalic and atraumatic.  ?   Right Ear: Hearing, tympanic membrane, ear canal and external ear normal.  ?   Left Ear: Hearing, tympanic membrane, ear canal and external ear normal.  ?   Nose: Nose normal.  ?   Comments:  Min max sinus pressure bilat.  ?   Mouth/Throat:  ?   Pharynx: No posterior oropharyngeal erythema.  ?   Comments: Prominent left tonsil -  chronic per pt.  ?Eyes:  ?   Conjunctiva/sclera: Conjunctivae normal.  ?   Pupils: Pupils are equal, round, and reactive to light.  ?Cardiovascular:  ?   Rate and Rhythm: Normal rate and regular rhythm.  ?   Heart sounds: Normal heart sounds. No murmur heard. ?Pulmonary:  ?   Effort: Pulmonary effort is normal. No respiratory distress.  ?   Breath sounds: Normal breath sounds. No wheezing or rhonchi.  ?Skin: ?   General: Skin is warm and dry.  ?   Findings: No rash.  ?Neurological:  ?   Mental Status:  She is alert and oriented to person, place, and time.  ?Psychiatric:     ?   Mood and Affect: Mood normal.     ?   Behavior: Behavior normal.     ?   Thought Content: Thought content normal.  ? ? ? ? ? ?Assessment & Plan:  ?ELLAMARIE NAEVE is a 41 y.o. female . ?Medication monitoring encounter - Plan: ToxASSURE Select 13 (MW), Urine ?Psychophysiological insomnia - Plan: ALPRAZolam (XANAX) 0.5 MG tablet ? -Stable control with low-dose alprazolam, anywhere from 0.3 to 0.4 mg roughly based on breakage of pills.  Continue lowest effective dose, refills ordered -90-day prescription given as she will be out of the country later this summer and will run out if only monthly dosing.  No concerns on controlled substance database reviewed.  Urine drug screen obtained.  Continue other coping techniques for anxiety management, sleep phobia management. ? ?Allergic rhinitis, unspecified seasonality, unspecified trigger ? -Steroid nasal spray discussed including correct usage/technique.  Avoid decongestants as likely cause of previous side effects.  Over-the-counter nonsedating antihistamine such as Claritin as additional option with RTC precautions. ? ?Meds ordered this encounter  ?Medications  ? ALPRAZolam (XANAX) 0.5 MG tablet  ?  Sig: TAKE 1 TABLET BY MOUTH AT BEDTIME AS NEEDED FOR ANXIETY  ?  Dispense:  90 tablet  ?  Refill:  0  ? ?Patient Instructions  ?Avoid decongestants (sudafed).  ?You can try flonase 1 spray per nostril with  correct technique.  ?Claritin is another option over the counter.  ?Let me know if these are not tolerated or other med needed.  ?No med changes for now.  ?I will try longer Rx to see if that is covered to help

## 2022-02-20 NOTE — Patient Instructions (Addendum)
Avoid decongestants (sudafed).  ?You can try flonase 1 spray per nostril with correct technique.  ?Claritin is another option over the counter.  ?Let me know if these are not tolerated or other med needed.  ?No med changes for now.  ?I will try longer Rx to see if that is covered to help with having meds available for your trip later this year. Have a great trip! ? ?Allergic Rhinitis, Adult ? ?Allergic rhinitis is an allergic reaction that affects the mucous membrane inside the nose. The mucous membrane is the tissue that produces mucus. ?There are two types of allergic rhinitis: ?Seasonal. This type is also called hay fever and happens only during certain seasons. ?Perennial. This type can happen at any time of the year. ?Allergic rhinitis cannot be spread from person to person. This condition can be mild, moderate, or severe. It can develop at any age and may be outgrown. ?What are the causes? ?This condition is caused by allergens. These are things that can cause an allergic reaction. Allergens may differ for seasonal allergic rhinitis and perennial allergic rhinitis. ?Seasonal allergic rhinitis is triggered by pollen. Pollen can come from grasses, trees, and weeds. ?Perennial allergic rhinitis may be triggered by: ?Dust mites. ?Proteins in a pet's urine, saliva, or dander. Dander is dead skin cells from a pet. ?Smoke, mold, or car fumes. ?What increases the risk? ?You are more likely to develop this condition if you have a family history of allergies or other conditions related to allergies, including: ?Allergic conjunctivitis. This is inflammation of parts of the eyes and eyelids. ?Asthma. This condition affects the lungs and makes it hard to breathe. ?Atopic dermatitis or eczema. This is long term (chronic) inflammation of the skin. ?Food allergies. ?What are the signs or symptoms? ?Symptoms of this condition include: ?Sneezing or coughing. ?A stuffy nose (nasal congestion), itchy nose, or nasal  discharge. ?Itchy eyes and tearing of the eyes. ?A feeling of mucus dripping down the back of your throat (postnasal drip). ?Trouble sleeping. ?Tiredness or fatigue. ?Headache. ?Sore throat. ?How is this diagnosed? ?This condition may be diagnosed with your symptoms, medical history, and physical exam. Your health care provider may check for related conditions, such as: ?Asthma. ?Pink eye. This is eye inflammation caused by infection (conjunctivitis). ?Ear infection. ?Upper respiratory infection. This is an infection in the nose, throat, or upper airways. ?You may also have tests to find out which allergens trigger your symptoms. These may include skin tests or blood tests. ?How is this treated? ?There is no cure for this condition, but treatment can help control symptoms. Treatment may include: ?Taking medicines that block allergy symptoms, such as corticosteroids and antihistamines. Medicine may be given as a shot, nasal spray, or pill. ?Avoiding any allergens. ?Being exposed again and again to tiny amounts of allergens to help you build a defense against allergens (immunotherapy). This is done if other treatments have not helped. It may include: ?Allergy shots. These are injected medicines that have small amounts of allergen in them. ?Sublingual immunotherapy. This involves taking small doses of a medicine with allergen in it under your tongue. ?If these treatments do not work, your health care provider may prescribe newer, stronger medicines. ?Follow these instructions at home: ?Avoiding allergens ?Find out what you are allergic to and avoid those allergens. These are some things you can do to help avoid allergens: ?If you have perennial allergies: ?Replace carpet with wood, tile, or vinyl flooring. Carpet can trap dander and dust. ?Do not smoke.  Do not allow smoking in your home. ?Change your heating and air conditioning filters at least once a month. ?If you have seasonal allergies, take these steps during  allergy season: ?Keep windows closed as much as possible. ?Plan outdoor activities when pollen counts are lowest. Check pollen counts before you plan outdoor activities. ?When coming indoors, change clothing and shower before sitting on furniture or bedding. ?If you have a pet in the house that produces allergens: ?Keep the pet out of the bedroom. ?Vacuum, sweep, and dust regularly. ?General instructions ?Take over-the-counter and prescription medicines only as told by your health care provider. ?Drink enough fluid to keep your urine pale yellow. ?Keep all follow-up visits as told by your health care provider. This is important. ?Where to find more information ?American Academy of Allergy, Asthma & Immunology: www.aaaai.org ?Contact a health care provider if: ?You have a fever. ?You develop a cough that does not go away. ?You make whistling sounds when you breathe (wheeze). ?Your symptoms slow you down or stop you from doing your normal activities each day. ?Get help right away if: ?You have shortness of breath. ?This symptom may represent a serious problem that is an emergency. Do not wait to see if the symptom will go away. Get medical help right away. Call your local emergency services (911 in the [place] Do not drive yourself to the hospital. ?Summary ?Allergic rhinitis may be managed by taking medicines as directed and avoiding allergens. ?If you have seasonal allergies, keep windows closed as much as possible during allergy season. ?Contact your health care provider if you develop a fever or a cough that does not go away. ?This information is not intended to replace advice given to you by your health care provider. Make sure you discuss any questions you have with your health care provider. ?Document Revised: 11/25/2019 Document Reviewed: 10/04/2019 ?Elsevier Patient Education ? [place]. ? ? ?

## 2022-02-21 ENCOUNTER — Encounter: Payer: Self-pay | Admitting: Family Medicine

## 2022-02-27 LAB — TOXASSURE SELECT 13 (MW), URINE

## 2022-04-16 ENCOUNTER — Ambulatory Visit: Payer: BC Managed Care – PPO | Admitting: Physician Assistant

## 2022-04-23 ENCOUNTER — Ambulatory Visit: Payer: BC Managed Care – PPO | Admitting: Physician Assistant

## 2022-05-18 ENCOUNTER — Telehealth: Payer: 59 | Admitting: Family

## 2022-05-18 DIAGNOSIS — R399 Unspecified symptoms and signs involving the genitourinary system: Secondary | ICD-10-CM

## 2022-05-18 DIAGNOSIS — R109 Unspecified abdominal pain: Secondary | ICD-10-CM

## 2022-05-18 NOTE — Progress Notes (Signed)
Because your back pain and UTI symptoms, I feel your condition warrants further evaluation and I recommend that you be seen in a face to face visit to rule out a more serious infection.    NOTE: There will be NO CHARGE for this eVisit   If you are having a true medical emergency please call 911.      For an urgent face to face visit, [facility] has seven urgent care centers for your convenience:     [facility] [facility] [place] Get Driving Directions [phone] [place], [place] [address]    [facility]) Get Driving Directions [phone] [place], [place] [address]  [facility] [place] ([place]) Get Driving Directions [phone] [address] [place],  [place]  [address]  [facility] - at [address] Get Driving Directions  [phone] [address] [place],  [place] [address]   [facility] Get Driving Directions [phone] [place], [place], [address] [address]   [facility] Get Driving Directions  [phone] [address], [place] [address]    [facility] at [place] Get Driving Directions [phone] [address], [place], [place] [address]  Your MyChart E-visit questionnaire answers were reviewed by a board certified advanced clinical practitioner to complete your personal care plan based on your specific symptoms.  Thank you for using e-Visits.

## 2022-06-26 ENCOUNTER — Ambulatory Visit (INDEPENDENT_AMBULATORY_CARE_PROVIDER_SITE_OTHER): Payer: 59 | Admitting: Family Medicine

## 2022-06-26 ENCOUNTER — Encounter: Payer: Self-pay | Admitting: Family Medicine

## 2022-06-26 VITALS — BP 110/80 | HR 104 | Temp 98.4°F | Ht 65.0 in | Wt 137.6 lb

## 2022-06-26 DIAGNOSIS — F5104 Psychophysiologic insomnia: Secondary | ICD-10-CM | POA: Diagnosis not present

## 2022-06-26 DIAGNOSIS — Z5181 Encounter for therapeutic drug level monitoring: Secondary | ICD-10-CM | POA: Diagnosis not present

## 2022-06-26 DIAGNOSIS — J309 Allergic rhinitis, unspecified: Secondary | ICD-10-CM

## 2022-06-26 DIAGNOSIS — Z7184 Encounter for health counseling related to travel: Secondary | ICD-10-CM

## 2022-06-26 DIAGNOSIS — R69 Illness, unspecified: Secondary | ICD-10-CM | POA: Diagnosis not present

## 2022-06-26 DIAGNOSIS — R062 Wheezing: Secondary | ICD-10-CM

## 2022-06-26 NOTE — Progress Notes (Signed)
Subjective:  Patient ID: Ruth [name], female    DOB: 08/02/1981  Age: 41 y.o. MRN: [id number]  CC:  Chief Complaint  Patient presents with   Insomnia    Pt states when she takes her meds she is able to go to sleep    HPI ILIYANA CONVEY presents for   Psychophysiological insomnia Last discussed in May.  See prior attempted treatments including melatonin, hydroxyzine, therapy with sleep phobia, anxiety treatment.  Has worked on sleep hygiene, CBT for sleep and focus.  No daytime anxiety.  Ultimately has been controlled with alprazolam 0.5 mg nightly and has self weaned to lower dosing in the past.  Reports 0.3 -0.4 mg as effective dose.  Urine drug screen obtained at her May 4 visit, consistent.  Still doing well on current dose.  Will be in [place] 9/18 - 08/20/22. Still using just under full pill at night. Has enough for trip based on recent fill.  Controlled substance database (PDMP) reviewed. No concerns appreciated. Last filled 05/23/22.  - last rx for #90 on last visit - no refill needed.  Will be in major cities in [place].  Nasal congestion: Discussed last visit. Allergy meds made jittery. Using saline ns for now - managing ok. No need for albuterol recently.   Immunization History  Administered Date(s) Administered   Influenza,inj,Quad PF,6+ Mos 07/25/2018   Influenza-Unspecified 11/13/2016, 07/25/2018   PFIZER(Purple Top)SARS-COV-2 Vaccination 01/19/2020, 09/14/2020  Had covid booster - bivalent.  Td: declines.  Flu vaccine: recommended. Declined. Plans in November. Discussed concerns with upcoming travel.    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   Dysphonia 07/05/2019   Otalgia of left ear 07/05/2019   Chest pain 12/02/2018   Tachycardia 12/02/2018   Tonsil asymmetry 04/14/2017   Chronic neck pain 12/29/2016   Chronic  pain syndrome 12/29/2016   Nonallopathic lesion of cervical region 12/29/2016   Nonallopathic lesion of thoracic region 12/29/2016   Nonallopathic lesion of lumbosacral region 12/29/2016   Trigger index finger of left hand 04/03/2016   Rectal bleeding 03/18/2016   Rectal pain 03/18/2016   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   HYPOTENSION 07/23/2007   ACNE, MILD 07/23/2007   Past Medical History:  Diagnosis Date   Allergy    Arthritis    Atypical mole 08/26/2011   Left Breast (mild)   Frequency-urgency sydrome    GERD (gastroesophageal reflux disease)    no meds   PONV (postoperative nausea and vomiting)    Past Surgical History:  Procedure Laterality Date   LAPAROSCOPY  04/19/2012   Procedure: LAPAROSCOPY DIAGNOSTIC;  Surgeon: Selinda Orion, MD;  Location: [facility];  Service: Gynecology;  Laterality: N/A;  left ovarian cystectomy with excision of BENIGN l cystic 7.6cm left teratoma WITH ENDO CATCH REMOVAL and cauterization of endometriosis   nasal septoplasty  '11   SKIN BIOPSY Left 09/05/2011   left breast- 0 tmt.    WISDOM TOOTH EXTRACTION     Allergies  Allergen Reactions   Lactose Intolerance (Gi) Hives    ANY MILK  BASED PROTEIN   Whey Protein [Protein] Hives   Prior to Admission medications   Medication Sig Start Date End Date Taking? Authorizing Provider  ALPRAZolam [name]) 0.5 MG tablet TAKE 1 TABLET BY MOUTH AT BEDTIME AS NEEDED FOR ANXIETY 02/20/22  Yes Wendie Agreste,  MD  ALPRAZolam [name]) 0.25 MG tablet Xanax Patient not taking: Reported on 06/26/2022    [provider]  Naproxen Sodium (ALEVE) 220 MG CAPS Aleve    [provider]   Social History   Socioeconomic History   Marital status: Single    Spouse name: Not on file   Number of children: Not on file   Years of education: Not on file   Highest education level: Not on file  Occupational History   Not on file  Tobacco Use   Smoking status: Former    Packs/day:  1.00    Types: Cigarettes    Quit date: 04/12/2004    Years since quitting: 18.2   Smokeless tobacco: Never  Vaping Use   Vaping Use: Never used  Substance and Sexual Activity   Alcohol use: Yes    Comment: social   Drug use: No   Sexual activity: Yes  Other Topics Concern   Not on file  Social History Narrative   Lives with partner.     Social Determinants of Health   Financial Resource Strain: Not on file  Food Insecurity: Not on file  Transportation Needs: Not on file  Physical Activity: Not on file  Stress: Not on file  Social Connections: Not on file  Intimate Partner Violence: Not on file    Review of Systems   Objective:   Vitals:   06/26/22 1336  BP: 110/80  Pulse: (!) 104  Temp: 98.4 F (36.9 C)  SpO2: 98%  Weight: 137 lb 9.6 oz (62.4 kg)  Height: '5\' 5"'$  (1.651 m)     Physical Exam Vitals reviewed.  Constitutional:      General: She is not in acute distress.    Appearance: Normal appearance. She is well-developed.  HENT:     Head: Normocephalic and atraumatic.  Cardiovascular:     Rate and Rhythm: Normal rate.  Pulmonary:     Effort: Pulmonary effort is normal.  Neurological:     Mental Status: She is alert and oriented to person, place, and time.  Psychiatric:        Mood and Affect: Mood normal.        Assessment & Plan:  [name] is a 41 y.o. female . Medication monitoring encounter Psychophysiological insomnia  -Stable on current dose of alprazolam, will continue same.  Does have sufficient quantity for her trip to [place].  Advised to call when next refill needed.  Allergic rhinitis, unspecified seasonality, unspecified trigger  -Unfortunately intolerant to nasal sprays, antihistamines.  Okay to continue saline nasal spray.  Trigger avoidance is much as possible.  Counseling about travel  -Vaccinations recommended as above, deferred for now.  CDC.gov travel section discussed to review any other recommendations for her areas  of travel.  RTC precautions given.  No orders of the defined types were placed in this encounter.  Patient Instructions  See CDC.gov for travel info on upcoming trip and let me know if any questions or other recommended vaccines. Have a great trip.  Let me know when next refill needed, but follow up in 6 months to discuss meds.      Signed,   [name], MD [facility] 06/26/22 2:24 PM

## 2022-06-26 NOTE — Patient Instructions (Addendum)
See CDC.gov for travel info on upcoming trip and let me know if any questions or other recommended vaccines. Have a great trip.  Let me know when next refill needed, but follow up in 6 months to discuss meds.

## 2022-08-19 ENCOUNTER — Other Ambulatory Visit: Payer: Self-pay | Admitting: Family Medicine

## 2022-08-19 DIAGNOSIS — F5104 Psychophysiologic insomnia: Secondary | ICD-10-CM

## 2022-08-19 MED ORDER — ALPRAZOLAM 0.5 MG PO TABS: ORAL_TABLET | ORAL | 0 refills | Status: DC

## 2022-08-19 NOTE — Telephone Encounter (Signed)
Pt is a pt of Dr Carlota Raspberry Xanax 0.5  mg LOV: 06/26/22 Last Refill:02/20/22 Upcoming appt: 12/25/22

## 2022-08-25 ENCOUNTER — Encounter: Payer: 59 | Admitting: Family Medicine

## 2022-10-07 ENCOUNTER — Other Ambulatory Visit: Payer: Self-pay | Admitting: Family Medicine

## 2022-10-07 DIAGNOSIS — F5104 Psychophysiologic insomnia: Secondary | ICD-10-CM

## 2022-10-07 NOTE — Telephone Encounter (Signed)
Xanax 0.5 mg LOV: 06/26/22 Last Refill:08/19/22 Upcoming appt: 12/25/22

## 2022-10-07 NOTE — Telephone Encounter (Signed)
Discussed at office visit September 7.Controlled substance database (PDMP) reviewed. No concerns appreciated.  Last filled 08/19/2022 for #30 by Dr. Birdie Riddle, previously August 4.  New prescription given.

## 2022-10-23 ENCOUNTER — Ambulatory Visit: Payer: 59

## 2022-11-12 ENCOUNTER — Other Ambulatory Visit: Payer: Self-pay | Admitting: Family Medicine

## 2022-11-12 DIAGNOSIS — F5104 Psychophysiologic insomnia: Secondary | ICD-10-CM

## 2022-11-12 NOTE — Telephone Encounter (Signed)
Xanax 0.5 mg LOV: 06/26/22 Last Refill:10/07/22 Upcoming appt: no apt

## 2022-11-12 NOTE — Telephone Encounter (Signed)
Controlled substance database (PDMP) reviewed. No concerns appreciated.  Last filled 10/07/22 - #30. Discussed at [place] 06/26/22. Refill ordered.

## 2022-12-02 DIAGNOSIS — K6289 Other specified diseases of anus and rectum: Secondary | ICD-10-CM | POA: Diagnosis not present

## 2022-12-02 DIAGNOSIS — Z800 Family history of malignant neoplasm of digestive organs: Secondary | ICD-10-CM | POA: Diagnosis not present

## 2022-12-02 DIAGNOSIS — K625 Hemorrhage of anus and rectum: Secondary | ICD-10-CM | POA: Diagnosis not present

## 2022-12-02 DIAGNOSIS — Z8719 Personal history of other diseases of the digestive system: Secondary | ICD-10-CM | POA: Diagnosis not present

## 2022-12-25 ENCOUNTER — Encounter: Payer: Self-pay | Admitting: Family Medicine

## 2022-12-25 ENCOUNTER — Ambulatory Visit (INDEPENDENT_AMBULATORY_CARE_PROVIDER_SITE_OTHER): Payer: 59 | Admitting: Family Medicine

## 2022-12-25 VITALS — BP 118/62 | HR 87 | Temp 98.0°F | Ht 65.0 in | Wt 135.2 lb

## 2022-12-25 DIAGNOSIS — F5104 Psychophysiologic insomnia: Secondary | ICD-10-CM | POA: Diagnosis not present

## 2022-12-25 DIAGNOSIS — Z23 Encounter for immunization: Secondary | ICD-10-CM

## 2022-12-25 NOTE — Progress Notes (Signed)
Subjective:  Patient ID: Ruth [name], female    DOB: 03-20-1981  Age: 42 y.o. MRN: [id number]  CC:  Chief Complaint  Patient presents with   Insomnia    Pt following up today, pt notes medication doing well     HPI Ruth [name] presents for   Insomnia: Psychophysiological insomnia.  Multiple other medications/treatments tried previously including melatonin, hydroxyzine, therapy with sleep phobia and anxiety treatment.  Sleep hygiene, CBT for sleep and focus.  No daytime anxiety.  Has been controlled with alprazolam 0.5 mg nightly and has taken 0.3 to 0.4 mg with partial dosing of medication that has been effective.  Urine drug screen May last year consistent. Trip to [place] in September through November of last year. Trip was incredible. Plans to go back to some of the same places. Enjoyed [place]. Controlled substance database (PDMP) reviewed. No concerns appreciated. Alprazolam - last filled #30 on 11/13/22. Still breaking small piece off and uses capsule - approx 0.3-0.'4mg'$  - 8 left.  Still working well. No new side effects. No parasomnias, no daytime anxiety.   Plan on EGD, colonoscopy soon on 3/20 with [facility] for prior hx of esophageal ulcers, gastritis last year. Some throat sx's  - discussed with GI. Prior tx with prilosec - back on prilosec for 2 week intervals.    HM: Hep C Ab - next visit with physical  Covid vaccine - booster and flu in December.  Tdap today.   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   Dysphonia 07/05/2019   Otalgia of left ear 07/05/2019   Chest pain 12/02/2018   Tachycardia 12/02/2018   Tonsil asymmetry 04/14/2017   Chronic neck pain 12/29/2016   Chronic pain syndrome 12/29/2016   Nonallopathic lesion of cervical region 12/29/2016   Nonallopathic lesion of thoracic region 12/29/2016   Nonallopathic  lesion of lumbosacral region 12/29/2016   Trigger index finger of left hand 04/03/2016   Rectal bleeding 03/18/2016   Rectal pain 03/18/2016   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   HYPOTENSION 07/23/2007   ACNE, MILD 07/23/2007   Past Medical History:  Diagnosis Date   Allergy    Arthritis    Atypical mole 08/26/2011   Left Breast (mild)   Frequency-urgency sydrome    GERD (gastroesophageal reflux disease)    no meds   PONV (postoperative nausea and vomiting)    Past Surgical History:  Procedure Laterality Date   LAPAROSCOPY  04/19/2012   Procedure: LAPAROSCOPY DIAGNOSTIC;  Surgeon: Selinda Orion, MD;  Location[facility];  Service: Gynecology;  Laterality: N/A;  left ovarian cystectomy with excision of BENIGN l cystic 7.6cm left teratoma WITH ENDO CATCH REMOVAL and cauterization of endometriosis   nasal septoplasty  '11   SKIN BIOPSY Left 09/05/2011   left breast- 0 tmt.    WISDOM TOOTH EXTRACTION     Allergies  Allergen Reactions   Lactose Hives   Lactose Intolerance (Gi) Hives    ANY MILK  BASED PROTEIN   Whey Protein [Protein] Hives   Prior to Admission medications   Medication Sig Start Date End Date Taking? Authorizing Provider  ALPRAZolam [name]) 0.5 MG tablet TAKE 1 TABLET BY MOUTH AT BEDTIME AS NEEDED FOR ANXIETY 11/12/22   Wendie Agreste, MD   Social History   Socioeconomic History   Marital status: Single  Spouse name: Not on file   Number of children: Not on file   Years of education: Not on file   Highest education level: Not on file  Occupational History   Not on file  Tobacco Use   Smoking status: Former    Packs/day: 1.00    Types: Cigarettes    Quit date: 04/12/2004    Years since quitting: 18.7   Smokeless tobacco: Never  Vaping Use   Vaping Use: Never used  Substance and Sexual Activity   Alcohol use: Yes    Comment: social   Drug use: No   Sexual activity: Yes  Other Topics Concern   Not on file  Social History  Narrative   Lives with partner.     Social Determinants of Health   Financial Resource Strain: Not on file  Food Insecurity: Not on file  Transportation Needs: Not on file  Physical Activity: Not on file  Stress: Not on file  Social Connections: Not on file  Intimate Partner Violence: Not on file    Review of Systems Per HPI.   Objective:   Vitals:   12/25/22 1349  BP: 118/62  Pulse: 87  Temp: 98 F (36.7 C)  TempSrc: Temporal  SpO2: 97%  Weight: 135 lb 3.2 oz (61.3 kg)  Height: '5\' 5"'$  (1.651 m)     Physical Exam Vitals reviewed.  Constitutional:      Appearance: Normal appearance. She is well-developed.  HENT:     Head: Normocephalic and atraumatic.  Eyes:     Conjunctiva/sclera: Conjunctivae normal.     Pupils: Pupils are equal, round, and reactive to light.  Neck:     Vascular: No carotid bruit.  Cardiovascular:     Rate and Rhythm: Normal rate and regular rhythm.     Heart sounds: Normal heart sounds.  Pulmonary:     Effort: Pulmonary effort is normal.     Breath sounds: Normal breath sounds.  Abdominal:     Palpations: Abdomen is soft. There is no pulsatile mass.     Tenderness: There is no abdominal tenderness.  Musculoskeletal:     Right lower leg: No edema.     Left lower leg: No edema.  Skin:    General: Skin is warm and dry.  Neurological:     Mental Status: She is alert and oriented to person, place, and time.  Psychiatric:        Mood and Affect: Mood normal.        Behavior: Behavior normal.        Thought Content: Thought content normal.        Judgment: Judgment normal.        Assessment & Plan:  Ruth [name] is a 42 y.o. female . Psychophysiological insomnia  Need for tetanus booster - Plan: Tdap vaccine greater than or equal to 7yo IM  Stable control insomnia with low-dose clonazepam, which she breaks into smaller quantity.  No concerns with current treatment, stable, no daytime anxiety symptoms.  Consistent use previously.   Not yet due for refill.  Okay to refill until follow-up visit for physical in 6 months.  Plan on lab work at that time including hep C testing.  Keep follow-up as planned with gastroenterology with history of gastritis, esophagitis.  Tdap updated.  No orders of the defined types were placed in this encounter.  There are no Patient Instructions on file for this visit.    Signed,   [name], MD [facility] 12/25/22 2:26 PM

## 2022-12-25 NOTE — Patient Instructions (Signed)
Thanks for coming in today.  Glad you had a good trip to [place]!  No change in meds for now.  Please let us know when next refill is needed.  Good luck with upcoming procedures with gastroenterology, let me know if there is anything needed from my standpoint.  46-monthfollow-up for physical and we can check fasting labs at that time including hepatitis C test at that visit.  That is a routine test that can be checked once.  Let me know if you have any questions and have a great summer.

## 2022-12-28 ENCOUNTER — Other Ambulatory Visit: Payer: Self-pay | Admitting: Family Medicine

## 2022-12-28 DIAGNOSIS — F5104 Psychophysiologic insomnia: Secondary | ICD-10-CM

## 2022-12-29 NOTE — Telephone Encounter (Signed)
Medication discussed at office visit last week.  Controlled substance database reviewed.  Last prescription 11/13/2022, refill ordered.

## 2022-12-29 NOTE — Telephone Encounter (Signed)
Xanax 0.5 mg LOV: 12/24/21 Last Refill:11/12/22 Upcoming appt: 07/03/23

## 2023-01-07 DIAGNOSIS — K648 Other hemorrhoids: Secondary | ICD-10-CM | POA: Diagnosis not present

## 2023-01-07 DIAGNOSIS — K644 Residual hemorrhoidal skin tags: Secondary | ICD-10-CM | POA: Diagnosis not present

## 2023-01-07 DIAGNOSIS — K5730 Diverticulosis of large intestine without perforation or abscess without bleeding: Secondary | ICD-10-CM | POA: Diagnosis not present

## 2023-01-07 DIAGNOSIS — Z8719 Personal history of other diseases of the digestive system: Secondary | ICD-10-CM | POA: Diagnosis not present

## 2023-01-07 DIAGNOSIS — K2100 Gastro-esophageal reflux disease with esophagitis, without bleeding: Secondary | ICD-10-CM | POA: Diagnosis not present

## 2023-01-07 DIAGNOSIS — Z800 Family history of malignant neoplasm of digestive organs: Secondary | ICD-10-CM | POA: Diagnosis not present

## 2023-01-07 DIAGNOSIS — K6289 Other specified diseases of anus and rectum: Secondary | ICD-10-CM | POA: Diagnosis not present

## 2023-01-07 DIAGNOSIS — R1310 Dysphagia, unspecified: Secondary | ICD-10-CM | POA: Diagnosis not present

## 2023-01-07 DIAGNOSIS — K2210 Ulcer of esophagus without bleeding: Secondary | ICD-10-CM | POA: Diagnosis not present

## 2023-01-07 DIAGNOSIS — K2289 Other specified disease of esophagus: Secondary | ICD-10-CM | POA: Diagnosis not present

## 2023-01-07 DIAGNOSIS — K625 Hemorrhage of anus and rectum: Secondary | ICD-10-CM | POA: Diagnosis not present

## 2023-01-13 NOTE — Progress Notes (Deleted)
  [place] Phone: [phone] Subjective:    I'm seeing this patient by the request  of:  Wendie Agreste, MD  CC:   [id number]  Ruth [name] is a 42 y.o. female coming in with complaint of back and neck pain. OMT on 02/14/2020. Patient states L neck and shoulder pain.   Medications patient has been prescribed:   Taking:         Reviewed prior external information including notes and imaging from previsou exam, outside providers and external EMR if available.   As well as notes that were available from care everywhere and other healthcare systems.  Past medical history, social, surgical and family history all reviewed in electronic medical record.  No pertanent information unless stated regarding to the chief complaint.   Past Medical History:  Diagnosis Date   Allergy    Arthritis    Atypical mole 08/26/2011   Left Breast (mild)   Frequency-urgency sydrome    GERD (gastroesophageal reflux disease)    no meds   PONV (postoperative nausea and vomiting)     Allergies  Allergen Reactions   Lactose Hives   Lactose Intolerance (Gi) Hives    ANY MILK  BASED PROTEIN   Whey Protein [Protein] Hives     Review of Systems:  No headache, visual changes, nausea, vomiting, diarrhea, constipation, dizziness, abdominal pain, skin rash, fevers, chills, night sweats, weight loss, swollen lymph nodes, body aches, joint swelling, chest pain, shortness of breath, mood changes. POSITIVE muscle aches  Objective  There were no vitals taken for this visit.   General: No apparent distress alert and oriented x3 mood and affect normal, dressed appropriately.  HEENT: Pupils equal, extraocular movements intact  Respiratory: Patient's speak in full sentences and does not appear short of breath  Cardiovascular: No lower extremity edema, non tender, no erythema  Gait MSK:  Back   Osteopathic findings  C2  flexed rotated and side bent right C6 flexed rotated and side bent left T3 extended rotated and side bent right inhaled rib T9 extended rotated and side bent left L2 flexed rotated and side bent right Sacrum right on right       Assessment and Plan:  No problem-specific Assessment & Plan notes found for this encounter.    Nonallopathic problems  Decision today to treat with OMT was based on Physical Exam  After verbal consent patient was treated with HVLA, ME, FPR techniques in cervical, rib, thoracic, lumbar, and sacral  areas  Patient tolerated the procedure well with improvement in symptoms  Patient given exercises, stretches and lifestyle modifications  See medications in patient instructions if given  Patient will follow up in 4-8 weeks             Note: This dictation was prepared with Dragon dictation along with smaller phrase technology. Any transcriptional errors that result from this process are unintentional.

## 2023-01-20 ENCOUNTER — Ambulatory Visit: Payer: 59 | Admitting: Family Medicine

## 2023-01-26 ENCOUNTER — Ambulatory Visit: Payer: 59 | Admitting: Family Medicine

## 2023-02-17 DIAGNOSIS — Z130 Encounter for screening for diseases of the blood and blood-forming organs and certain disorders involving the immune mechanism: Secondary | ICD-10-CM | POA: Diagnosis not present

## 2023-02-17 DIAGNOSIS — Z01419 Encounter for gynecological examination (general) (routine) without abnormal findings: Secondary | ICD-10-CM | POA: Diagnosis not present

## 2023-02-17 DIAGNOSIS — Z1389 Encounter for screening for other disorder: Secondary | ICD-10-CM | POA: Diagnosis not present

## 2023-02-23 ENCOUNTER — Ambulatory Visit: Payer: 59 | Admitting: Family Medicine

## 2023-02-25 ENCOUNTER — Other Ambulatory Visit: Payer: Self-pay | Admitting: Family Medicine

## 2023-02-25 DIAGNOSIS — F5104 Psychophysiologic insomnia: Secondary | ICD-10-CM

## 2023-02-25 NOTE — Telephone Encounter (Signed)
Xanax 0.5 mg LOV: 12/25/22 Last Refill:12/29/22 Upcoming appt: 07/03/23

## 2023-02-26 NOTE — Telephone Encounter (Signed)
Medication discussed at her March 7 visit.  Alprazolam No. 30 last filled 12/29/2022.  Previously 11/13/2022.  Refill ordered.

## 2023-03-12 DIAGNOSIS — Z1231 Encounter for screening mammogram for malignant neoplasm of breast: Secondary | ICD-10-CM | POA: Diagnosis not present

## 2023-03-19 ENCOUNTER — Ambulatory Visit: Payer: 59 | Admitting: Family Medicine

## 2023-03-30 DIAGNOSIS — L738 Other specified follicular disorders: Secondary | ICD-10-CM | POA: Diagnosis not present

## 2023-03-30 DIAGNOSIS — D225 Melanocytic nevi of trunk: Secondary | ICD-10-CM | POA: Diagnosis not present

## 2023-03-30 DIAGNOSIS — L814 Other melanin hyperpigmentation: Secondary | ICD-10-CM | POA: Diagnosis not present

## 2023-03-30 DIAGNOSIS — L821 Other seborrheic keratosis: Secondary | ICD-10-CM | POA: Diagnosis not present

## 2023-03-30 DIAGNOSIS — L609 Nail disorder, unspecified: Secondary | ICD-10-CM | POA: Diagnosis not present

## 2023-04-08 ENCOUNTER — Other Ambulatory Visit: Payer: Self-pay | Admitting: Family Medicine

## 2023-04-08 DIAGNOSIS — F5104 Psychophysiologic insomnia: Secondary | ICD-10-CM

## 2023-04-08 NOTE — Telephone Encounter (Signed)
Xanax 0.5 mg LOV: 12/25/22 Last Refill:02/26/23 Upcoming appt: 07/03/23

## 2023-04-09 NOTE — Telephone Encounter (Signed)
Controlled substance database reviewed.  Alprazolam No. 30 last filled 02/26/2023, medication discussed at March 7 visit.  Refill ordered.

## 2023-04-16 ENCOUNTER — Ambulatory Visit: Payer: 59 | Admitting: Family Medicine

## 2023-04-16 DIAGNOSIS — S90861A Insect bite (nonvenomous), right foot, initial encounter: Secondary | ICD-10-CM | POA: Diagnosis not present

## 2023-04-22 ENCOUNTER — Ambulatory Visit: Payer: 59 | Admitting: Family Medicine

## 2023-05-19 ENCOUNTER — Ambulatory Visit: Payer: BC Managed Care – PPO | Admitting: Podiatry

## 2023-05-19 DIAGNOSIS — K600 Acute anal fissure: Secondary | ICD-10-CM | POA: Insufficient documentation

## 2023-05-19 DIAGNOSIS — L603 Nail dystrophy: Secondary | ICD-10-CM | POA: Diagnosis not present

## 2023-05-19 DIAGNOSIS — R194 Change in bowel habit: Secondary | ICD-10-CM | POA: Insufficient documentation

## 2023-05-19 NOTE — Progress Notes (Signed)
  Subjective:  Patient ID: BLISS CHYNOWETH, female    DOB: August 28, 1981,  MRN: [id number] HPI Chief Complaint  Patient presents with   Nail Problem    Pt came in today to get her toenails looked at she stated that she did some lab work and it was not a fungal and she is here to figure out what is going on.pt denies pian just thick toenails she stated.    42 y.o. female presents with the above complaint.   ROS: Denies fever chills nausea vomit muscle aches pains calf pain back pain chest pain shortness of breath  Past Medical History:  Diagnosis Date   Allergy    Arthritis    Atypical mole 08/26/2011   Left Breast (mild)   Frequency-urgency sydrome    GERD (gastroesophageal reflux disease)    no meds   PONV (postoperative nausea and vomiting)    Past Surgical History:  Procedure Laterality Date   LAPAROSCOPY  04/19/2012   Procedure: LAPAROSCOPY DIAGNOSTIC;  Surgeon: Gretta Cool, MD;  Location:  [facility];  Service: Gynecology;  Laterality: N/A;  left ovarian cystectomy with excision of BENIGN l cystic 7.6cm left teratoma WITH ENDO CATCH REMOVAL and cauterization of endometriosis   nasal septoplasty  '11   SKIN BIOPSY Left 09/05/2011   left breast- 0 tmt.    WISDOM TOOTH EXTRACTION      Current Outpatient Medications:    ALPRAZolam (XANAX) 0.5 MG tablet, TAKE 1 TABLET BY MOUTH AT BEDTIME AS NEEDED FOR ANXIETY, Disp: 30 tablet, Rfl: 0  Allergies  Allergen Reactions   Lactose Hives   Lactose Intolerance (Gi) Hives    ANY MILK  BASED PROTEIN   Whey     Other Reaction(s): Unknown   Whey Protein [Protein] Hives   Review of Systems Objective:  There were no vitals filed for this visit.  General: Well developed, nourished, in no acute distress, alert and oriented x3   Dermatological: Skin is warm, dry and supple bilateral. Nails x 10 are well maintained; nail plate second digit bilateral with thick yellow dystrophic does not appear to be mycotic there is no  surrounding signs of mycosis.  Remaining integument appears unremarkable at this time. There are no open sores, no preulcerative lesions, no rash or signs of infection present.  Vascular: Dorsalis Pedis artery and Posterior Tibial artery pedal pulses are 2/4 bilateral with immedate capillary fill time. Pedal hair growth present. No varicosities and no lower extremity edema present bilateral.   Neruologic: Grossly intact via light touch bilateral. Vibratory intact via tuning fork bilateral. Protective threshold with Semmes Wienstein monofilament intact to all pedal sites bilateral. Patellar and Achilles deep tendon reflexes 2+ bilateral. No Babinski or clonus noted bilateral.   Musculoskeletal: No gross boney pedal deformities bilateral. No pain, crepitus, or limitation noted with foot and ankle range of motion bilateral. Muscular strength 5/5 in all groups tested bilateral.  Gait: Unassisted, Nonantalgic.    Radiographs:  None taken   Assessment & Plan:   Assessment: Nail dystrophy second digit bilateral  Plan: Discussed options with her she will follow-up on an as-needed basis     Emmons Toth [place], [place]

## 2023-06-01 ENCOUNTER — Ambulatory Visit: Payer: 59 | Admitting: Family Medicine

## 2023-06-04 ENCOUNTER — Encounter (INDEPENDENT_AMBULATORY_CARE_PROVIDER_SITE_OTHER): Payer: Self-pay

## 2023-06-15 ENCOUNTER — Other Ambulatory Visit: Payer: Self-pay | Admitting: Family Medicine

## 2023-06-15 DIAGNOSIS — F5104 Psychophysiologic insomnia: Secondary | ICD-10-CM

## 2023-06-15 NOTE — Telephone Encounter (Signed)
Xanax 0.5 mg Requested Prescriptions   Pending Prescriptions Disp Refills   ALPRAZolam (XANAX) 0.5 MG tablet [Pharmacy Med Name: ALPRAZOLAM 0.5 MG TABLET] 30 tablet 0    Sig: TAKE 1 TABLET BY MOUTH AT BEDTIME AS NEEDED FOR ANXIETY     Date of patient request: 06/15/23 Last office visit: 12/25/22 Date of last refill: 04/09/23 Last refill amount: 30 Follow up time period per chart: 6 month

## 2023-06-16 NOTE — Telephone Encounter (Signed)
Medication discussed at her March visit.  34-month follow-up planned.  Controlled substance database reviewed.  #30 last filled June 21.  No concerns, refill ordered.

## 2023-06-25 ENCOUNTER — Telehealth: Payer: Self-pay | Admitting: Family Medicine

## 2023-06-25 NOTE — Telephone Encounter (Signed)
Pt has been rescheduled for later this month for OV, will reschedule physical later

## 2023-06-25 NOTE — Telephone Encounter (Signed)
Caller name: MILYN ZYLSTRA  On DPR?: Yes  Call back number: [phone] (mobile)  Provider they see: Shade Flood, MD  Reason for call:   Pt called to called to cancel PHY and req OV claiming it was easier to schedule. Upon seeing how many canc appt she had I wondered what was going on. Then I saw the med. She just kept saying Neva Seat would say, Hello and she would be on her way'. And she could get her med refill in on OV and didn't need a PHY. After I said I was putting in a telephone note after reading Neva Seat last visit note. I said she probably needed to stick to the plan of having a PHY and he could fill scripts at a PHY she hung up.

## 2023-07-02 DIAGNOSIS — M65331 Trigger finger, right middle finger: Secondary | ICD-10-CM | POA: Diagnosis not present

## 2023-07-03 ENCOUNTER — Encounter: Payer: 59 | Admitting: Family Medicine

## 2023-07-06 ENCOUNTER — Encounter: Payer: Self-pay | Admitting: Family Medicine

## 2023-07-06 ENCOUNTER — Ambulatory Visit: Payer: BC Managed Care – PPO | Admitting: Family Medicine

## 2023-07-06 VITALS — BP 92/58 | HR 83 | Temp 98.0°F | Wt 136.8 lb

## 2023-07-06 DIAGNOSIS — Z8719 Personal history of other diseases of the digestive system: Secondary | ICD-10-CM | POA: Diagnosis not present

## 2023-07-06 DIAGNOSIS — F5104 Psychophysiologic insomnia: Secondary | ICD-10-CM | POA: Diagnosis not present

## 2023-07-06 NOTE — Patient Instructions (Signed)
No med changes at this time.  I will refer you to other gastroenterology group for second opinion on anal fissures.  Sorry to hear you are still having difficulty.  If you have not heard from their office in 2 weeks, let me know.  43-month follow-up for a physical but I am happy to see you sooner if needed.  Take care.

## 2023-07-06 NOTE — Progress Notes (Signed)
Subjective:  Patient ID: Ruth [name], female    DOB: [birth year]  Age: 42 y.o. MRN: [id number]  CC:  Chief Complaint  Patient presents with   Medication Management    HPI Ruth [name] presents for   Insomnia Psychophysiological insomnia.  Multiple different medications have been tried previously including melatonin, hydroxyzine, therapy with sleep phobia and anxiety treatment, sleep hygiene and CBT for sleep and focus.  Denies daytime anxiety.  Ultimately has been controlled on small dose of alprazolam with 0.5 mg prescription, has taken 0.3 to 0.4 mg with partial dosing and that medication has been effective.  Still working well without parasomnias or daytime anxiety, denies any side effects.  Controlled substance database reviewed.  #30 alprazolam filled on 06/16/2023, previously 04/10/2023, 02/26/2023, 12/29/2022.  Had recent visit with gynecology. Lisbeth Ply, PA. Had labwork done.      07/06/2023    3:43 PM 12/25/2022    1:45 PM 02/20/2022   11:39 AM 04/11/2020   10:28 AM  GAD 7 : Generalized Anxiety Score  Nervous, Anxious, on Edge 0 0 0 0  Control/stop worrying 0 0 0 0  Worry too much - different things 0 0 0 0  Trouble relaxing 0 0 0 0  Restless 0 0 0 0  Easily annoyed or irritable 0 0 0 0  Afraid - awful might happen 0 0 0 0  Total GAD 7 Score 0 0 0 0       07/06/2023    3:43 PM 12/25/2022    1:45 PM 06/26/2022    1:34 PM 06/21/2021    8:09 AM 11/28/2020    3:58 PM  Depression screen PHQ 2/9  Decreased Interest 0 0 0 0 0  Down, Depressed, Hopeless 0 0 0 0 0  PHQ - 2 Score 0 0 0 0 0  Altered sleeping 0 1 0 0   Tired, decreased energy 0 0 0 0   Change in appetite 0 0 0 0   Feeling bad or failure about yourself  0 0 0 0   Trouble concentrating 0 0 0 0   Moving slowly or fidgety/restless 0 0 0 0   Suicidal thoughts 0 0 0 0   PHQ-9 Score 0 1 0 0   Difficult doing work/chores    Not difficult at all     Anal fissures: Has been seen by gastroenterology - treated with  diltiazem cream BID for 30 days, off for a few days, then restarts. Past 3-4 months. Still some persistent issues. Larger stools, but not hard stools. Still soreness and daily bleeding. Met with general surgery - no changes, did not recommend surgery due to concerns with possible anal sphinctre   Would like to meet with [facility] on [address] for second opinion.  [address] 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   Otalgia of left ear 07/05/2019   Chest pain 12/02/2018   Tachycardia 12/02/2018   Tonsil asymmetry 04/14/2017   Chronic neck pain 12/29/2016   Chronic pain syndrome 12/29/2016   Nonallopathic lesion of cervical region 12/29/2016   Nonallopathic lesion of thoracic region 12/29/2016   Nonallopathic lesion of lumbosacral region 12/29/2016   Trigger index finger of left hand  04/03/2016   Rectal bleeding 03/18/2016   Rectal pain 03/18/2016   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   HYPOTENSION 07/23/2007   ACNE, MILD 07/23/2007   Past Medical History:  Diagnosis Date   Allergy    Arthritis    Atypical mole 08/26/2011   Left Breast (mild)   Frequency-urgency sydrome    GERD (gastroesophageal reflux disease)    no meds   PONV (postoperative nausea and vomiting)    Past Surgical History:  Procedure Laterality Date   LAPAROSCOPY  04/19/2012   Procedure: LAPAROSCOPY DIAGNOSTIC;  Surgeon: Gretta Cool, MD;  Location: [facility];  Service: Gynecology;  Laterality: N/A;  left ovarian cystectomy with excision of BENIGN l cystic 7.6cm left teratoma WITH ENDO CATCH REMOVAL and cauterization of endometriosis   nasal septoplasty  '11   SKIN BIOPSY Left 09/05/2011   left breast- 0 tmt.    WISDOM  TOOTH EXTRACTION     Allergies  Allergen Reactions   Lactose Hives   Lactose Intolerance (Gi) Hives    ANY MILK  BASED PROTEIN   Whey     Other Reaction(s): Unknown   Whey Protein [Protein] Hives   Prior to Admission medications   Medication Sig Start Date End Date Taking? Authorizing Provider  ALPRAZolam Prudy Feeler) 0.5 MG tablet TAKE 1 TABLET BY MOUTH AT BEDTIME AS NEEDED FOR ANXIETY 06/16/23  Yes Shade Flood, MD   Social History   Socioeconomic History   Marital status: Single    Spouse name: Not on file   Number of children: Not on file   Years of education: Not on file   Highest education level: Not on file  Occupational History   Not on file  Tobacco Use   Smoking status: Former    Current packs/day: 0.00    Types: Cigarettes    Quit date: 04/12/2004    Years since quitting: 19.2   Smokeless tobacco: Never  Vaping Use   Vaping status: Never Used  Substance and Sexual Activity   Alcohol use: Yes    Comment: social   Drug use: No   Sexual activity: Yes  Other Topics Concern   Not on file  Social History Narrative   Lives with partner.     Social Determinants of Health   Financial Resource Strain: Not on file  Food Insecurity: Not on file  Transportation Needs: Not on file  Physical Activity: Not on file  Stress: Not on file  Social Connections: Not on file  Intimate Partner Violence: Not on file    Review of Systems Per HPI.  Objective:   Vitals:   07/06/23 1539  BP: (!) 92/58  Pulse: 83  Temp: 98 F (36.7 C)  TempSrc: Temporal  SpO2: 97%  Weight: 136 lb 12.8 oz (62.1 kg)     Physical Exam Vitals reviewed.  Constitutional:      Appearance: Normal appearance. She is well-developed.  HENT:     Head: Normocephalic and atraumatic.  Eyes:     Conjunctiva/sclera: Conjunctivae normal.     Pupils: Pupils are equal, round, and reactive to light.  Neck:     Vascular: No carotid bruit.  Cardiovascular:     Rate and Rhythm: Normal rate and  regular rhythm.     Heart sounds: Normal heart sounds.  Pulmonary:     Effort: Pulmonary effort is normal.     Breath sounds: Normal breath sounds.  Abdominal:     General: Abdomen is flat. There is  no distension.     Palpations: Abdomen is soft. There is no pulsatile mass.     Tenderness: There is no abdominal tenderness.  Musculoskeletal:     Right lower leg: No edema.     Left lower leg: No edema.  Skin:    General: Skin is warm and dry.  Neurological:     Mental Status: She is alert and oriented to person, place, and time.  Psychiatric:        Mood and Affect: Mood normal.        Behavior: Behavior normal.        Thought Content: Thought content normal.        Assessment & Plan:  Ruth [name] is a 42 y.o. female . Psychophysiological insomnia  -Stable control with low-dose benzodiazepine, no concerns with review of controlled substance database, okay to refill when due next with 44-month follow-up.  History of anal fissures - Plan: Ambulatory referral to Gastroenterology  -Unfortunately she has experienced persistent symptoms, daily symptoms.  Not thought to be surgical candidate, but would like to have second opinion with different gastroenterology group to review options.  Referral placed.  Has Cardizem gel for treatment at this time.  No orders of the defined types were placed in this encounter.  Patient Instructions  No med changes at this time.  I will refer you to other gastroenterology group for second opinion on anal fissures.  Sorry to hear you are still having difficulty.  If you have not heard from their office in 2 weeks, let me know.  45-month follow-up for a physical but I am happy to see you sooner if needed.  Take care.     Signed,   Meredith Staggers, MD [facility], [facility] 07/06/23 4:19 PM

## 2023-07-29 ENCOUNTER — Other Ambulatory Visit: Payer: Self-pay | Admitting: Family Medicine

## 2023-07-29 DIAGNOSIS — F5104 Psychophysiologic insomnia: Secondary | ICD-10-CM

## 2023-07-29 NOTE — Telephone Encounter (Signed)
Recent office visit to discuss medication, controlled substance database reviewed.  Last prescription filled 06/16/2023 for #30, refill ordered.

## 2023-07-29 NOTE — Telephone Encounter (Signed)
Patient is requesting a refill of the following medications: Requested Prescriptions   Pending Prescriptions Disp Refills   ALPRAZolam (XANAX) 0.5 MG tablet [Pharmacy Med Name: ALPRAZOLAM 0.5 MG TABLET] 30 tablet 0    Sig: TAKE 1 TABLET BY MOUTH AT BEDTIME AS NEEDED FOR ANXIETY    Date of patient request: 07/29/23 Last office visit: 07/06/23 Date of last refill: 06/16/23 Last refill amount: 30 Follow up time period per chart: 6 months

## 2023-08-11 DIAGNOSIS — K601 Chronic anal fissure: Secondary | ICD-10-CM | POA: Diagnosis not present

## 2023-08-31 DIAGNOSIS — R3915 Urgency of urination: Secondary | ICD-10-CM | POA: Diagnosis not present

## 2023-08-31 DIAGNOSIS — R3912 Poor urinary stream: Secondary | ICD-10-CM | POA: Diagnosis not present

## 2023-08-31 DIAGNOSIS — R350 Frequency of micturition: Secondary | ICD-10-CM | POA: Diagnosis not present

## 2023-08-31 DIAGNOSIS — R3914 Feeling of incomplete bladder emptying: Secondary | ICD-10-CM | POA: Diagnosis not present

## 2023-08-31 DIAGNOSIS — R3911 Hesitancy of micturition: Secondary | ICD-10-CM | POA: Diagnosis not present

## 2023-09-03 DIAGNOSIS — K601 Chronic anal fissure: Secondary | ICD-10-CM | POA: Diagnosis not present

## 2023-09-03 DIAGNOSIS — Z91011 Allergy to milk products: Secondary | ICD-10-CM | POA: Diagnosis not present

## 2023-09-03 DIAGNOSIS — G4700 Insomnia, unspecified: Secondary | ICD-10-CM | POA: Diagnosis not present

## 2023-09-03 DIAGNOSIS — K602 Anal fissure, unspecified: Secondary | ICD-10-CM | POA: Diagnosis not present

## 2023-09-03 DIAGNOSIS — Z79899 Other long term (current) drug therapy: Secondary | ICD-10-CM | POA: Diagnosis not present

## 2023-09-03 DIAGNOSIS — Z91018 Allergy to other foods: Secondary | ICD-10-CM | POA: Diagnosis not present

## 2023-09-19 ENCOUNTER — Other Ambulatory Visit: Payer: Self-pay | Admitting: Family Medicine

## 2023-09-19 DIAGNOSIS — F5104 Psychophysiologic insomnia: Secondary | ICD-10-CM

## 2023-09-21 NOTE — Telephone Encounter (Signed)
Medication discussed at September 16 visit.  Stable control with low-dose benzodiazepine at that time.  Controlled substance database reviewed.  #30 of alprazolam 0.5 mg last filled on 07/30/2023, previously 06/16/2023.  Refill ordered.

## 2023-09-21 NOTE — Telephone Encounter (Signed)
Requested Prescriptions   Pending Prescriptions Disp Refills   ALPRAZolam (XANAX) 0.5 MG tablet [Pharmacy Med Name: ALPRAZOLAM 0.5 MG TABLET] 30 tablet 0    Sig: TAKE 1 TABLET BY MOUTH AT BEDTIME AS NEEDED FOR ANXIETY     Date of patient request: 09/21/2023 Last office visit: 07/06/2023 Upcoming visit: 01/04/2024 Date of last refill: 07/29/2023 Last refill amount: 30 0 refills

## 2023-10-01 DIAGNOSIS — K601 Chronic anal fissure: Secondary | ICD-10-CM | POA: Diagnosis not present

## 2023-10-01 DIAGNOSIS — Z1330 Encounter for screening examination for mental health and behavioral disorders, unspecified: Secondary | ICD-10-CM | POA: Diagnosis not present

## 2023-10-07 DIAGNOSIS — R350 Frequency of micturition: Secondary | ICD-10-CM | POA: Diagnosis not present

## 2023-10-07 DIAGNOSIS — R3915 Urgency of urination: Secondary | ICD-10-CM | POA: Diagnosis not present

## 2023-10-26 DIAGNOSIS — R3912 Poor urinary stream: Secondary | ICD-10-CM | POA: Diagnosis not present

## 2023-10-26 DIAGNOSIS — R3911 Hesitancy of micturition: Secondary | ICD-10-CM | POA: Diagnosis not present

## 2023-10-26 DIAGNOSIS — R3914 Feeling of incomplete bladder emptying: Secondary | ICD-10-CM | POA: Diagnosis not present

## 2023-11-14 ENCOUNTER — Other Ambulatory Visit: Payer: Self-pay | Admitting: Family Medicine

## 2023-11-14 DIAGNOSIS — F5104 Psychophysiologic insomnia: Secondary | ICD-10-CM

## 2023-11-17 NOTE — Telephone Encounter (Signed)
Controlled substance database reviewed.  #30 alprazolam last filled on 09/21/2023, previously 07/30/2023.  Medication discussed at 07/06/2023 visit.  Refilled.

## 2023-11-30 DIAGNOSIS — R350 Frequency of micturition: Secondary | ICD-10-CM | POA: Diagnosis not present

## 2023-11-30 DIAGNOSIS — M62838 Other muscle spasm: Secondary | ICD-10-CM | POA: Diagnosis not present

## 2023-11-30 DIAGNOSIS — K5902 Outlet dysfunction constipation: Secondary | ICD-10-CM | POA: Diagnosis not present

## 2023-11-30 DIAGNOSIS — N941 Unspecified dyspareunia: Secondary | ICD-10-CM | POA: Diagnosis not present

## 2023-12-10 DIAGNOSIS — K5900 Constipation, unspecified: Secondary | ICD-10-CM | POA: Diagnosis not present

## 2023-12-10 DIAGNOSIS — R102 Pelvic and perineal pain: Secondary | ICD-10-CM | POA: Diagnosis not present

## 2023-12-10 DIAGNOSIS — M62838 Other muscle spasm: Secondary | ICD-10-CM | POA: Diagnosis not present

## 2023-12-23 DIAGNOSIS — N941 Unspecified dyspareunia: Secondary | ICD-10-CM | POA: Diagnosis not present

## 2023-12-23 DIAGNOSIS — R350 Frequency of micturition: Secondary | ICD-10-CM | POA: Diagnosis not present

## 2023-12-23 DIAGNOSIS — M62838 Other muscle spasm: Secondary | ICD-10-CM | POA: Diagnosis not present

## 2023-12-23 DIAGNOSIS — R102 Pelvic and perineal pain: Secondary | ICD-10-CM | POA: Diagnosis not present

## 2023-12-31 ENCOUNTER — Other Ambulatory Visit: Payer: Self-pay | Admitting: Family Medicine

## 2023-12-31 DIAGNOSIS — F5104 Psychophysiologic insomnia: Secondary | ICD-10-CM

## 2023-12-31 NOTE — Telephone Encounter (Signed)
 Medication discussed on July 06, 2023.  Controlled substance database reviewed.  #30 last filled on 11/17/2023.  Refill ordered.

## 2024-01-04 ENCOUNTER — Ambulatory Visit (INDEPENDENT_AMBULATORY_CARE_PROVIDER_SITE_OTHER): Payer: BC Managed Care – PPO | Admitting: Family Medicine

## 2024-01-04 VITALS — BP 108/62 | HR 87 | Ht 64.0 in | Wt 137.0 lb

## 2024-01-04 DIAGNOSIS — Z131 Encounter for screening for diabetes mellitus: Secondary | ICD-10-CM

## 2024-01-04 DIAGNOSIS — Z13220 Encounter for screening for lipoid disorders: Secondary | ICD-10-CM | POA: Diagnosis not present

## 2024-01-04 DIAGNOSIS — Z1329 Encounter for screening for other suspected endocrine disorder: Secondary | ICD-10-CM | POA: Diagnosis not present

## 2024-01-04 DIAGNOSIS — Z0000 Encounter for general adult medical examination without abnormal findings: Secondary | ICD-10-CM | POA: Diagnosis not present

## 2024-01-04 DIAGNOSIS — H9312 Tinnitus, left ear: Secondary | ICD-10-CM

## 2024-01-04 DIAGNOSIS — Z130 Encounter for screening for diseases of the blood and blood-forming organs and certain disorders involving the immune mechanism: Secondary | ICD-10-CM | POA: Diagnosis not present

## 2024-01-04 DIAGNOSIS — F5104 Psychophysiologic insomnia: Secondary | ICD-10-CM

## 2024-01-04 LAB — COMPREHENSIVE METABOLIC PANEL
ALT: 22 U/L (ref 0–35)
AST: 17 U/L (ref 0–37)
Albumin: 4.3 g/dL (ref 3.5–5.2)
Alkaline Phosphatase: 76 U/L (ref 39–117)
BUN: 18 mg/dL (ref 6–23)
CO2: 27 meq/L (ref 19–32)
Calcium: 9.7 mg/dL (ref 8.4–10.5)
Chloride: 104 meq/L (ref 96–112)
Creatinine, Ser: 0.56 mg/dL (ref 0.40–1.20)
GFR: 111.32 mL/min (ref >60.00)
Glucose, Bld: 90 mg/dL (ref 70–99)
Potassium: 4.4 meq/L (ref 3.5–5.1)
Sodium: 138 meq/L (ref 135–145)
Total Bilirubin: 0.8 mg/dL (ref 0.2–1.2)
Total Protein: 7.3 g/dL (ref 6.0–8.3)

## 2024-01-04 LAB — LIPID PANEL
Cholesterol: 140 mg/dL (ref 0–200)
HDL: 60.3 mg/dL (ref >39.00)
LDL Cholesterol: 63 mg/dL (ref 0–99)
NonHDL: 80.01
Total CHOL/HDL Ratio: 2
Triglycerides: 83 mg/dL (ref 0.0–149.0)
VLDL: 16.6 mg/dL (ref 0.0–40.0)

## 2024-01-04 LAB — TSH: TSH: 0.6 u[IU]/mL (ref 0.35–5.50)

## 2024-01-04 LAB — CBC
HCT: 40.7 % (ref 36.0–46.0)
Hemoglobin: 13.5 g/dL (ref 12.0–15.0)
MCHC: 33.1 g/dL (ref 30.0–36.0)
MCV: 85.3 fl (ref 78.0–100.0)
Platelets: 243 10*3/uL (ref 150.0–400.0)
RBC: 4.77 Mil/uL (ref 3.87–5.11)
RDW: 13.1 % (ref 11.5–15.5)
WBC: 10 10*3/uL (ref 4.0–10.5)

## 2024-01-04 LAB — HEMOGLOBIN A1C: Hgb A1c MFr Bld: 5.4 % (ref 4.6–6.5)

## 2024-01-04 NOTE — Patient Instructions (Addendum)
 I will refer you to ENT to discuss the ear ringing. Background noise can be helpful.  For secondary noise he may have some component of eustachian tube dysfunction from congestion.  Try Flonase nasal spray over-the-counter to see if that makes a difference, especially if any nasal congestion or change in seasons, allergy season.  If any concerns on labs I will let you know.  Take care and happy birthday!  Preventive Care 44-44 Years Old, Female Preventive care refers to lifestyle choices and visits with your health care provider that can promote health and wellness. Preventive care visits are also called wellness exams. What can I expect for my preventive care visit? Counseling Your health care provider may ask you questions about your: Medical history, including: Past medical problems. Family medical history. Pregnancy history. Current health, including: Menstrual cycle. Method of birth control. Emotional well-being. Home life and relationship well-being. Sexual activity and sexual health. Lifestyle, including: Alcohol, nicotine or tobacco, and drug use. Access to firearms. Diet, exercise, and sleep habits. Work and work Astronomer. Sunscreen use. Safety issues such as seatbelt and bike helmet use. Physical exam Your health care provider will check your: Height and weight. These may be used to calculate your BMI (body mass index). BMI is a measurement that tells if you are at a healthy weight. Waist circumference. This measures the distance around your waistline. This measurement also tells if you are at a healthy weight and may help predict your risk of certain diseases, such as type 2 diabetes and high blood pressure. Heart rate and blood pressure. Body temperature. Skin for abnormal spots. What immunizations do I need?  Vaccines are usually given at various ages, according to a schedule. Your health care provider will recommend vaccines for you based on your age, medical  history, and lifestyle or other factors, such as travel or where you work. What tests do I need? Screening Your health care provider may recommend screening tests for certain conditions. This may include: Lipid and cholesterol levels. Diabetes screening. This is done by checking your blood sugar (glucose) after you have not eaten for a while (fasting). Pelvic exam and Pap test. Hepatitis B test. Hepatitis C test. HIV (human immunodeficiency virus) test. STI (sexually transmitted infection) testing, if you are at risk. Lung cancer screening. Colorectal cancer screening. Mammogram. Talk with your health care provider about when you should start having regular mammograms. This may depend on whether you have a family history of breast cancer. BRCA-related cancer screening. This may be done if you have a family history of breast, ovarian, tubal, or peritoneal cancers. Bone density scan. This is done to screen for osteoporosis. Talk with your health care provider about your test results, treatment options, and if necessary, the need for more tests. Follow these instructions at home: Eating and drinking  Eat a diet that includes fresh fruits and vegetables, whole grains, lean protein, and low-fat dairy products. Take vitamin and mineral supplements as recommended by your health care provider. Do not drink alcohol if: Your health care provider tells you not to drink. You are pregnant, may be pregnant, or are planning to become pregnant. If you drink alcohol: Limit how much you have to 0-1 drink a day. Know how much alcohol is in your drink. In the [place] one drink equals one 12 oz bottle of beer (355 mL), one 5 oz glass of wine (148 mL), or one 1 oz glass of hard liquor (44 mL). Lifestyle Brush your teeth every morning and  night with fluoride toothpaste. Floss one time each day. Exercise for at least 30 minutes 5 or more days each week. Do not use any products that contain nicotine or tobacco.  These products include cigarettes, chewing tobacco, and vaping devices, such as e-cigarettes. If you need help quitting, ask your health care provider. Do not use drugs. If you are sexually active, practice safe sex. Use a condom or other form of protection to prevent STIs. If you do not wish to become pregnant, use a form of birth control. If you plan to become pregnant, see your health care provider for a prepregnancy visit. Take aspirin only as told by your health care provider. Make sure that you understand how much to take and what form to take. Work with your health care provider to find out whether it is safe and beneficial for you to take aspirin daily. Find healthy ways to manage stress, such as: Meditation, yoga, or listening to music. Journaling. Talking to a trusted person. Spending time with friends and family. Minimize exposure to UV radiation to reduce your risk of skin cancer. Safety Always wear your seat belt while driving or riding in a vehicle. Do not drive: If you have been drinking alcohol. Do not ride with someone who has been drinking. When you are tired or distracted. While texting. If you have been using any mind-altering substances or drugs. Wear a helmet and other protective equipment during sports activities. If you have firearms in your house, make sure you follow all gun safety procedures. Seek help if you have been physically or sexually abused. What's next? Visit your health care provider once a year for an annual wellness visit. Ask your health care provider how often you should have your eyes and teeth checked. Stay up to date on all vaccines. This information is not intended to replace advice given to you by your health care provider. Make sure you discuss any questions you have with your health care provider. Document Revised: 04/03/2021 Document Reviewed: 04/03/2021 Elsevier Patient Education  2024 [employer]   Tinnitus Tinnitus is when you hear  a sound that there's no actual source for. It may sound like ringing in your ears or something else. The sound may be loud, soft, or somewhere in between. It can last for a few seconds or be constant for days. It can come and go. Almost everyone has tinnitus at some point. It's not the same as hearing loss. But you may need to see a health care provider if: It lasts for a long time. It comes back often. You have trouble sleeping and focusing. What are the causes? The cause of tinnitus is often unknown. In some cases, you may get it if: You're around loud noises, such as from machines or music. An object gets stuck in your ear. Earwax builds up in Landscape [employer]. You drink a lot of alcohol or caffeine. You take certain medicines. You start to lose your hearing. You may also get it from some medical conditions. These may include: Ear or sinus infections. Heart diseases. High blood pressure. Allergies. Mnire's disease. Problems with your thyroid. A tumor. This is a growth of cells that isn't normal. A weak, bulging blood vessel called an aneurysm near your ear. What increases the risk? You may be more likely to get tinnitus if: You're around loud noises a lot. You're older. You drink alcohol. You smoke. What are the signs or symptoms? The main symptom is hearing a sound that there's no  source for. It may sound like ringing. It may also sound like: Buzzing. Sizzling. Blowing air. Hissing. Whistling. Other sounds may include: Roaring. Running water. A musical note. Tapping. Humming. You may have symptoms in one ear or both ears. How is this diagnosed? Tinnitus is diagnosed based on your symptoms, your medical history, and an exam. Your provider may do a full hearing test if your tinnitus: Is in just one ear. Makes it hard for you to hear. Lasts 6 months or longer. You may also need to see an expert in hearing disorders called an audiologist. They may ask you about your symptoms  and how tinnitus affects your daily life. You may have other tests done. These may include: A CT scan. An MRI. An angiogram. This shows how blood flows through your blood vessels. How is this treated? Treatment may include: Therapy to help you manage the stress of living with tinnitus. Finding ways to mask or cover the sound of tinnitus. These include: Sound or white noise machines. Devices that fit in your ear and play sounds or music. A loud humidifier. Acoustic neural stimulation. This is when you use headphones to listen to music that has a special signal in it. Over time, this signal may change some of the pathways in your brain. This can make you less sensitive to tinnitus. This treatment is used for very severe cases. Using hearing aids or cochlear implants if your tinnitus is from hearing loss. If your tinnitus is caused by a medical condition, treating the condition may make it go away.  Follow these instructions at home: Managing symptoms     Try to avoid being in loud places or around loud noises. Wear earplugs or headphones when you're around loud noises. Find ways to reduce stress. These may include meditation, yoga, or deep breathing. Sleep with your head slightly raised. General instructions Take over-the-counter and prescription medicines only as told by your provider. Track the things that cause symptoms (triggers). Try to avoid these things. To stop your tinnitus from getting worse: Do not drink alcohol. Do not have caffeine. Do not use any products that contain nicotine or tobacco. These products include cigarettes, chewing tobacco, and vaping devices, such as e-cigarettes. If you need help quitting, ask your provider. Avoid using too much salt. Get enough sleep each night. Where to find more information [employer]inn[employer] Association: https://www.johnson-hamilton.org/ Contact a health care provider if: Your symptoms last for 3 weeks or longer without stopping. You have sudden hearing  loss. Your symptoms get worse or don't get better with home care. You can't manage the stress of living with tinnitus. Get help right away if: You get tinnitus after a head injury. You have tinnitus and: Dizziness. Nausea and vomiting. Loss of balance. A sudden, severe headache. Changes to your eyesight. Weakness in your face, arms, or legs. These symptoms may be an emergency. Get help right away. Call 911. Do not wait to see if the symptoms will go away. Do not drive yourself to the hospital. This information is not intended to replace advice given to you by your health care provider. Make sure you discuss any questions you have with your health care provider. Document Revised: 01/12/2023 Document Reviewed: 01/12/2023 Elsevier Patient Education  2024 [employer].

## 2024-01-04 NOTE — Progress Notes (Unsigned)
 Subjective:  Patient ID: Ruth [name], female    DOB: 06/09/1981  Age: 43 y.o. MRN: [id number]  CC:  Chief Complaint  Patient presents with   Annual Exam    Patient states she still has tinnitus in her left ear and what could be the cause.   Non-fasting     HPI Ruth [name] presents for Annual Exam and concern as above.   Here with Ruth [name] today.   PCP, me Urology, Dr. Annabell Howells urinary hesitancy, frequency. On pelvic floor therapy.  Colorectal surgery, Dr. Ulysees Barns, chronic anal fissure Hand/Ortho, Dr, Iva Lento, right middle finger trigger finger,Eval September 2024 Podiatry, Dr. Al Corpus, nail dystrophy Gastroenterology, Dr. Levora Angel Gynecology, Lisbeth Ply, [place]. Sports med: Dr. 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  That has affected sleep at night, nerve medication was discussed but deferred previously.  OMT had been helpful as well in the past.  Note from Dr. Jenne Pane on 07/05/2019.Screening tests were normal at that time.  Ear and throat exam was normal and reassurance provided.  Planned on continued observation at that time and advised to follow-up if symptoms did not improve over time.  Today - notes persistent symptoms since 2020. Feeling of something in left ear with ringing in left ear. Has tried otc debrox gtts. Feels better, ringing improves for few days on gtts only.  Has tried 3 different times. No change in hearing. Hearing well.   Ringing off and on - intermittent flairs with worsening but constant noise.  High pitch ringing, with secondary high pitch rhythm like fax machine. Episodes of pulsatile tinnitus only when sick.  Not driving with windows down - noise sensitive. No use of firearms, no loud noise exposure.  No depression/SI.  Sleep machine has been helpful.  Occasional nasal congestion with change in seasons.    Psychophysiological insomnia See prior notes.  Multiple attempts at treating with various  meds including melatonin, hydroxyzine, therapy including for sleep phobia and anxiety treatment, sleep hygiene, CBT.  Ultimately has been well-controlled with low-dose of alprazolam, 0.3 to 0.4 mg dosing with partial dosing of her 0.5 mg prescription.  This has still worked well without parasomnias or daytime anxiety, denies any daytime side effects with this medication and controlled substance database reviewed.  Alprazolam 0.5 mg #30 last filled on 01/01/2024, previously 11/17/2023, 12-24, 07/30/2023.  #30 each.  No concerns.  Still effective.      01/04/2024   12:52 PM 07/06/2023    3:43 PM 12/25/2022    1:45 PM 06/26/2022    1:34 PM 06/21/2021    8:09 AM  Depression screen PHQ 2/9  Decreased Interest 0 0 0 0 0  Down, Depressed, Hopeless 0 0 0 0 0  PHQ - 2 Score 0 0 0 0 0  Altered sleeping 0 0 1 0 0  Tired, decreased energy 0 0 0 0 0  Change in appetite 0 0 0 0 0  Feeling bad or failure about yourself  0 0 0 0 0  Trouble concentrating 0 0 0 0 0  Moving slowly or fidgety/restless 0 0 0 0 0  Suicidal thoughts 0 0 0 0 0  PHQ-9 Score 0 0 1 0 0  Difficult doing work/chores Not difficult at all    Not difficult at all    Health Maintenance  Topic Date Due   Hepatitis C Screening  Never done   Cervical Cancer Screening (HPV/Pap Cotest)  01/12/2015  INFLUENZA VACCINE  05/21/2023   COVID-19 Vaccine (6 - 2024-25 season) 06/21/2023   DTaP/Tdap/Td (2 - Td or Tdap) 12/24/2032   HIV Screening  Completed   HPV VACCINES  Aged Out  Pap testing, mammogram with GYN - appt in 6 weeks. Last testing within a year.    Immunization History  Administered Date(s) Administered   Influenza,inj,Quad PF,6+ Mos 07/25/2018   Influenza-Unspecified 11/13/2016, 07/25/2018, 10/23/2022   PFIZER(Purple Top)SARS-COV-2 Vaccination 10/24/2019, 01/19/2020, 02/16/2020, 09/14/2020   Pfizer(Comirnaty)Fall Seasonal Vaccine 12 years and older 10/23/2022   Tdap 12/25/2022  Flu vaccine - declines at this time.  Covid booster  - unknown - recommended - had last fall.   Hep C screening - at GYN? Had prior.   No results found. Optho - no corrective lenses. No visual concerns.   Dental: ever 3 months, orthodontist.   Alcohol: 1 per day.   Tobacco: none, no vaping.   Exercise: cardio planned - plans on running, new neighborhood. Walking and resistance exercise. FITT principle discussed.   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   Otalgia of left ear 07/05/2019   Chest pain 12/02/2018   Tachycardia 12/02/2018   Tonsil asymmetry 04/14/2017   Chronic neck pain 12/29/2016   Chronic pain syndrome 12/29/2016   Nonallopathic lesion of cervical region 12/29/2016   Nonallopathic lesion of thoracic region 12/29/2016   Nonallopathic lesion of lumbosacral region 12/29/2016   Trigger index finger of left hand 04/03/2016   Rectal bleeding 03/18/2016   Rectal pain 03/18/2016   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   HYPOTENSION 07/23/2007   ACNE, MILD 07/23/2007   Past Medical History:  Diagnosis Date   Allergy    Arthritis    Atypical mole 08/26/2011   Left Breast (mild)   Frequency-urgency sydrome    GERD (gastroesophageal reflux disease)    no meds   PONV (postoperative nausea and vomiting)    Past Surgical History:  Procedure Laterality Date   LAPAROSCOPY  04/19/2012   Procedure: LAPAROSCOPY DIAGNOSTIC;  Surgeon: Gretta Cool, MD;  Location: [facility];  Service: Gynecology;  Laterality: N/A;  left ovarian cystectomy with excision of BENIGN l cystic 7.6cm left teratoma WITH ENDO CATCH REMOVAL and cauterization of endometriosis   nasal septoplasty  '11   SKIN BIOPSY Left 09/05/2011   left breast- 0 tmt.    WISDOM TOOTH EXTRACTION     Allergies  Allergen Reactions    Lactose Hives   Lactose Intolerance (Gi) Hives    ANY MILK  BASED PROTEIN   Whey     Other Reaction(s): Unknown   Whey Protein [Protein] Hives   Prior to Admission medications   Medication Sig Start Date End Date Taking? Authorizing Provider  ALPRAZolam Prudy Feeler) 0.5 MG tablet TAKE 1 TABLET BY MOUTH AT BEDTIME AS NEEDED FOR ANXIETY 12/31/23  Yes Shade Flood, MD   Social History   Socioeconomic History   Marital status: Single    Spouse name: Not on file   Number of children: Not on file   Years of education: Not on file   Highest education level: Not on file  Occupational History   Not on file  Tobacco Use   Smoking status: Former    Current packs/day: 0.00    Types:  Cigarettes    Quit date: 04/12/2004    Years since quitting: 19.7   Smokeless tobacco: Never  Vaping Use   Vaping status: Never Used  Substance and Sexual Activity   Alcohol use: Yes    Comment: social   Drug use: No   Sexual activity: Yes  Other Topics Concern   Not on file  Social History Narrative   Lives with partner.     Social Drivers of Corporate investment banker Strain: Not on file  Food Insecurity: Not on file  Transportation Needs: Not on file  Physical Activity: Not on file  Stress: Not on file  Social Connections: Unknown (07/07/2023)   [facility]   Social Network    Social Network: Not on file  Intimate Partner Violence: Not At Risk (09/03/2023)   Received from [facility]   HITS    Over the last 12 months how often did your partner physically hurt you?: Never    Over the last 12 months how often did your partner insult you or talk down to you?: Never    Over the last 12 months how often did your partner threaten you with physical harm?: Never    Over the last 12 months how often did your partner scream or curse at you?: Never    Review of Systems  13 point review of systems per patient health survey noted.  Negative other than as indicated above or in HPI.    Objective:   Vitals:   01/04/24 1248  BP: 108/62  Pulse: 87  SpO2: 97%  Weight: 137 lb (62.1 kg)  Height: 5\' 4"  (1.626 m)   {Vitals History (Optional):23777}  Physical Exam Vitals reviewed.  Constitutional:      Appearance: She is well-developed.  HENT:     Head: Normocephalic and atraumatic.     Right Ear: External ear normal.     Left Ear: External ear normal.     Ears:     Comments: Canals clear.  Possible small amount of clear fluid behind TM, base bilaterally.  No erythema, retraction or injection appreciated. Eyes:     Conjunctiva/sclera: Conjunctivae normal.     Pupils: Pupils are equal, round, and reactive to light.  Neck:     Thyroid: No thyromegaly.  Cardiovascular:     Rate and Rhythm: Normal rate and regular rhythm.     Heart sounds: Normal heart sounds. No murmur heard. Pulmonary:     Effort: Pulmonary effort is normal. No respiratory distress.     Breath sounds: Normal breath sounds. No wheezing.  Abdominal:     General: Bowel sounds are normal.     Palpations: Abdomen is soft.     Tenderness: There is no abdominal tenderness.  Musculoskeletal:        General: No tenderness. Normal range of motion.     Cervical back: Normal range of motion and neck supple.  Lymphadenopathy:     Cervical: No cervical adenopathy.  Skin:    General: Skin is warm and dry.     Findings: No rash.  Neurological:     Mental Status: She is alert and oriented to person, place, and time.  Psychiatric:        Behavior: Behavior normal.        Thought Content: Thought content normal.        Assessment & Plan:  Ruth [name] is a 43 y.o. female . Annual physical exam  Tinnitus of left ear -  Plan: Ambulatory referral to ENT  Psychophysiological insomnia  Screening for diabetes mellitus - Plan: Comprehensive metabolic panel, Hemoglobin A1c  Screening for hyperlipidemia - Plan: Comprehensive metabolic panel, Lipid panel  Screening, anemia, deficiency, iron - Plan:  CBC  Screening for thyroid disorder - Plan: TSH   No orders of the defined types were placed in this encounter.  Patient Instructions    I will refer you to ENT to discuss the ear ringing. Background noise can be helpful.  For secondary noise he may have some component of eustachian tube dysfunction from congestion.  Try Flonase nasal spray over-the-counter to see if that makes a difference, especially if any nasal congestion or change in seasons, allergy season.  If any concerns on labs I will let you know.  Take care and happy birthday!  Preventive Care 43-53 Years Old, Female Preventive care refers to lifestyle choices and visits with your health care provider that can promote health and wellness. Preventive care visits are also called wellness exams. What can I expect for my preventive care visit? Counseling Your health care provider may ask you questions about your: Medical history, including: Past medical problems. Family medical history. Pregnancy history. Current health, including: Menstrual cycle. Method of birth control. Emotional well-being. Home life and relationship well-being. Sexual activity and sexual health. Lifestyle, including: Alcohol, nicotine or tobacco, and drug use. Access to firearms. Diet, exercise, and sleep habits. Work and work Astronomer. Sunscreen use. Safety issues such as seatbelt and bike helmet use. Physical exam Your health care provider will check your: Height and weight. These may be used to calculate your BMI (body mass index). BMI is a measurement that tells if you are at a healthy weight. Waist circumference. This measures the distance around your waistline. This measurement also tells if you are at a healthy weight and may help predict your risk of certain diseases, such as type 2 diabetes and high blood pressure. Heart rate and blood pressure. Body temperature. Skin for abnormal spots. What immunizations do I need?  Vaccines are  usually given at various ages, according to a schedule. Your health care provider will recommend vaccines for you based on your age, medical history, and lifestyle or other factors, such as travel or where you work. What tests do I need? Screening Your health care provider may recommend screening tests for certain conditions. This may include: Lipid and cholesterol levels. Diabetes screening. This is done by checking your blood sugar (glucose) after you have not eaten for a while (fasting). Pelvic exam and Pap test. Hepatitis B test. Hepatitis C test. HIV (human immunodeficiency virus) test. STI (sexually transmitted infection) testing, if you are at risk. Lung cancer screening. Colorectal cancer screening. Mammogram. Talk with your health care provider about when you should start having regular mammograms. This may depend on whether you have a family history of breast cancer. BRCA-related cancer screening. This may be done if you have a family history of breast, ovarian, tubal, or peritoneal cancers. Bone density scan. This is done to screen for osteoporosis. Talk with your health care provider about your test results, treatment options, and if necessary, the need for more tests. Follow these instructions at home: Eating and drinking  Eat a diet that includes fresh fruits and vegetables, whole grains, lean protein, and low-fat dairy products. Take vitamin and mineral supplements as recommended by your health care provider. Do not drink alcohol if: Your health care provider tells you not to drink. You are pregnant, may be pregnant, or  are planning to become pregnant. If you drink alcohol: Limit how much you have to 0-1 drink a day. Know how much alcohol is in your drink. In the [place] one drink equals one 12 oz bottle of beer (355 mL), one 5 oz glass of wine (148 mL), or one 1 oz glass of hard liquor (44 mL). Lifestyle Brush your teeth every morning and night with fluoride toothpaste.  Floss one time each day. Exercise for at least 30 minutes 5 or more days each week. Do not use any products that contain nicotine or tobacco. These products include cigarettes, chewing tobacco, and vaping devices, such as e-cigarettes. If you need help quitting, ask your health care provider. Do not use drugs. If you are sexually active, practice safe sex. Use a condom or other form of protection to prevent STIs. If you do not wish to become pregnant, use a form of birth control. If you plan to become pregnant, see your health care provider for a prepregnancy visit. Take aspirin only as told by your health care provider. Make sure that you understand how much to take and what form to take. Work with your health care provider to find out whether it is safe and beneficial for you to take aspirin daily. Find healthy ways to manage stress, such as: Meditation, yoga, or listening to music. Journaling. Talking to a trusted person. Spending time with friends and family. Minimize exposure to UV radiation to reduce your risk of skin cancer. Safety Always wear your seat belt while driving or riding in a vehicle. Do not drive: If you have been drinking alcohol. Do not ride with someone who has been drinking. When you are tired or distracted. While texting. If you have been using any mind-altering substances or drugs. Wear a helmet and other protective equipment during sports activities. If you have firearms in your house, make sure you follow all gun safety procedures. Seek help if you have been physically or sexually abused. What's next? Visit your health care provider once a year for an annual wellness visit. Ask your health care provider how often you should have your eyes and teeth checked. Stay up to date on all vaccines. This information is not intended to replace advice given to you by your health care provider. Make sure you discuss any questions you have with your health care  provider. Document Revised: 04/03/2021 Document Reviewed: 04/03/2021 Elsevier Patient Education  2024 Elsevier Inc.   Tinnitus Tinnitus is when you hear a sound that there's no actual source for. It may sound like ringing in your ears or something else. The sound may be loud, soft, or somewhere in between. It can last for a few seconds or be constant for days. It can come and go. Almost everyone has tinnitus at some point. It's not the same as hearing loss. But you may need to see a health care provider if: It lasts for a long time. It comes back often. You have trouble sleeping and focusing. What are the causes? The cause of tinnitus is often unknown. In some cases, you may get it if: You're around loud noises, such as from machines or music. An object gets stuck in your ear. Earwax builds up in Landscape architect. You drink a lot of alcohol or caffeine. You take certain medicines. You start to lose your hearing. You may also get it from some medical conditions. These may include: Ear or sinus infections. Heart diseases. High blood pressure. Allergies. Mnire's disease. Problems with  your thyroid. A tumor. This is a growth of cells that isn't normal. A weak, bulging blood vessel called an aneurysm near your ear. What increases the risk? You may be more likely to get tinnitus if: You're around loud noises a lot. You're older. You drink alcohol. You smoke. What are the signs or symptoms? The main symptom is hearing a sound that there's no source for. It may sound like ringing. It may also sound like: Buzzing. Sizzling. Blowing air. Hissing. Whistling. Other sounds may include: Roaring. Running water. A musical note. Tapping. Humming. You may have symptoms in one ear or both ears. How is this diagnosed? Tinnitus is diagnosed based on your symptoms, your medical history, and an exam. Your provider may do a full hearing test if your tinnitus: Is in just one ear. Makes it hard  for you to hear. Lasts 6 months or longer. You may also need to see an expert in hearing disorders called an audiologist. They may ask you about your symptoms and how tinnitus affects your daily life. You may have other tests done. These may include: A CT scan. An MRI. An angiogram. This shows how blood flows through your blood vessels. How is this treated? Treatment may include: Therapy to help you manage the stress of living with tinnitus. Finding ways to mask or cover the sound of tinnitus. These include: Sound or white noise machines. Devices that fit in your ear and play sounds or music. A loud humidifier. Acoustic neural stimulation. This is when you use headphones to listen to music that has a special signal in it. Over time, this signal may change some of the pathways in your brain. This can make you less sensitive to tinnitus. This treatment is used for very severe cases. Using hearing aids or cochlear implants if your tinnitus is from hearing loss. If your tinnitus is caused by a medical condition, treating the condition may make it go away.  Follow these instructions at home: Managing symptoms     Try to avoid being in loud places or around loud noises. Wear earplugs or headphones when you're around loud noises. Find ways to reduce stress. These may include meditation, yoga, or deep breathing. Sleep with your head slightly raised. General instructions Take over-the-counter and prescription medicines only as told by your provider. Track the things that cause symptoms (triggers). Try to avoid these things. To stop your tinnitus from getting worse: Do not drink alcohol. Do not have caffeine. Do not use any products that contain nicotine or tobacco. These products include cigarettes, chewing tobacco, and vaping devices, such as e-cigarettes. If you need help quitting, ask your provider. Avoid using too much salt. Get enough sleep each night. Where to find more  information American Tinnitus Association: https://www.johnson-hamilton.org/ Contact a health care provider if: Your symptoms last for 3 weeks or longer without stopping. You have sudden hearing loss. Your symptoms get worse or don't get better with home care. You can't manage the stress of living with tinnitus. Get help right away if: You get tinnitus after a head injury. You have tinnitus and: Dizziness. Nausea and vomiting. Loss of balance. A sudden, severe headache. Changes to your eyesight. Weakness in your face, arms, or legs. These symptoms may be an emergency. Get help right away. Call 911. Do not wait to see if the symptoms will go away. Do not drive yourself to the hospital. This information is not intended to replace advice given to you by your health care provider. Make  sure you discuss any questions you have with your health care provider. Document Revised: 01/12/2023 Document Reviewed: 01/12/2023 Elsevier Patient Education  2024 Elsevier Inc.    Signed,   Meredith Staggers, MD [facility], [facility] 01/04/24 1:56 PM

## 2024-01-05 ENCOUNTER — Encounter: Payer: Self-pay | Admitting: Family Medicine

## 2024-01-19 DIAGNOSIS — K5900 Constipation, unspecified: Secondary | ICD-10-CM | POA: Diagnosis not present

## 2024-01-19 DIAGNOSIS — N941 Unspecified dyspareunia: Secondary | ICD-10-CM | POA: Diagnosis not present

## 2024-01-19 DIAGNOSIS — R102 Pelvic and perineal pain: Secondary | ICD-10-CM | POA: Diagnosis not present

## 2024-01-19 DIAGNOSIS — M62838 Other muscle spasm: Secondary | ICD-10-CM | POA: Diagnosis not present

## 2024-02-02 DIAGNOSIS — M62838 Other muscle spasm: Secondary | ICD-10-CM | POA: Diagnosis not present

## 2024-02-02 DIAGNOSIS — N941 Unspecified dyspareunia: Secondary | ICD-10-CM | POA: Diagnosis not present

## 2024-02-02 DIAGNOSIS — K5900 Constipation, unspecified: Secondary | ICD-10-CM | POA: Diagnosis not present

## 2024-02-02 DIAGNOSIS — R102 Pelvic and perineal pain: Secondary | ICD-10-CM | POA: Diagnosis not present

## 2024-02-22 ENCOUNTER — Other Ambulatory Visit: Payer: Self-pay | Admitting: Family Medicine

## 2024-02-22 DIAGNOSIS — F5104 Psychophysiologic insomnia: Secondary | ICD-10-CM

## 2024-02-22 NOTE — Telephone Encounter (Signed)
 Controlled substance database reviewed.  #30 last filled on 01/01/2024, previously 11/17/2023.  Discussed at her physical in March.  Refill ordered

## 2024-03-01 DIAGNOSIS — H9312 Tinnitus, left ear: Secondary | ICD-10-CM | POA: Diagnosis not present

## 2024-03-04 NOTE — Progress Notes (Signed)
 [facility] [address] [place] [address] Phone: [phone] Subjective:   Ruth [name], am serving as a scribe for Dr. Ronnell Coins.\  I'm seeing this patient by the request  of:  Benjiman Bras, MD  CC: Multiple complaints  [id number]  Ruth [name] is a 43 y.o. female coming in with complaint of back and neck pain. OMT on 02/14/2020. Patient states that her L side is out of whack. Notes pain in the patellar tendon that feels similar to when someone tests reflexes over the tendon.   Also has the feeling of wanting to shake the L hand but denies any tingling in the hand.   Notes that all of these symptoms started after she developed tinnitus in the L ear.   Made appt originally to have L shoulder looked at as the muscles seem to be atrophying recently. Notes limited ROM and strength in the joint. Sore in her L trap.   Medications patient has been prescribed:   Taking:         Reviewed prior external information including notes and imaging from previsou exam, outside providers and external EMR if available.   As well as notes that were available from care everywhere and other healthcare systems.  Past medical history, social, surgical and family history all reviewed in electronic medical record.  No pertanent information unless stated regarding to the chief complaint.   Past Medical History:  Diagnosis Date   Allergy    Arthritis    Atypical mole 08/26/2011   Left Breast (mild)   Frequency-urgency sydrome    GERD (gastroesophageal reflux disease)    no meds   PONV (postoperative nausea and vomiting)     Allergies  Allergen Reactions   Lactose Hives   Lactose Intolerance (Gi) Hives    ANY MILK  BASED PROTEIN   Whey     Other Reaction(s): Unknown   Whey Protein [Protein] Hives     Review of Systems:  No headache, visual changes, nausea, vomiting, diarrhea, constipation, dizziness, abdominal pain, skin rash,  fevers, chills, night sweats, weight loss, swollen lymph nodes, body aches, joint swelling, chest pain, shortness of breath, mood changes. POSITIVE muscle aches  Objective  Blood pressure 104/62, pulse 92, height 5\' 4"  (1.626 m), weight 137 lb (62.1 kg), SpO2 97%.   General: No apparent distress alert and oriented x3 mood and affect normal, dressed appropriately.  HEENT: Pupils equal, extraocular movements intact  Respiratory: Patient's speak in full sentences and does not appear short of breath  Cardiovascular: No lower extremity edema, non tender, no erythema  Gait relatively normal MSK:  Left side of the shoulder does have some mild atrophy noted.  Patient has some atrophy of the biceps muscle on the left compared to the right.  Good strength noted.  Neurovascularly seems to be intact though.    Assessment and Plan:  Atrophy of muscle of left upper arm Patient does have some atrophy noted, no significant family history of autoimmune disease.  Would like to check some laboratory workup, x-rays of the neck pending.  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  Given some exercises to try.  Discussed icing regimen and home exercises.  Increase activity slowly.  I do think patient could be a candidate for possible osteopathic manipulation if necessary at follow-up.  The above documentation has been reviewed and is accurate and complete Ruth Canton M Kentley Cedillo, DO         Note: This dictation was prepared with Dragon dictation along with smaller phrase technology. Any transcriptional errors that result from this process are unintentional.

## 2024-03-07 ENCOUNTER — Ambulatory Visit: Admitting: Family Medicine

## 2024-03-07 VITALS — BP 104/62 | HR 92 | Ht 64.0 in | Wt 137.0 lb

## 2024-03-07 DIAGNOSIS — M2550 Pain in unspecified joint: Secondary | ICD-10-CM | POA: Diagnosis not present

## 2024-03-07 DIAGNOSIS — M62522 Muscle wasting and atrophy, not elsewhere classified, left upper arm: Secondary | ICD-10-CM

## 2024-03-07 DIAGNOSIS — M7652 Patellar tendinitis, left knee: Secondary | ICD-10-CM

## 2024-03-07 LAB — CBC WITH DIFFERENTIAL/PLATELET
Basophils Absolute: 0 10*3/uL (ref 0.0–0.1)
Basophils Relative: 0.4 % (ref 0.0–3.0)
Eosinophils Absolute: 0.1 10*3/uL (ref 0.0–0.7)
Eosinophils Relative: 0.8 % (ref 0.0–5.0)
HCT: 39.2 % (ref 36.0–46.0)
Hemoglobin: 13.1 g/dL (ref 12.0–15.0)
Lymphocytes Relative: 26.9 % (ref 12.0–46.0)
Lymphs Abs: 2.5 10*3/uL (ref 0.7–4.0)
MCHC: 33.5 g/dL (ref 30.0–36.0)
MCV: 84.7 fl (ref 78.0–100.0)
Monocytes Absolute: 0.6 10*3/uL (ref 0.1–1.0)
Monocytes Relative: 7 % (ref 3.0–12.0)
Neutro Abs: 5.9 10*3/uL (ref 1.4–7.7)
Neutrophils Relative %: 64.9 % (ref 43.0–77.0)
Platelets: 221 10*3/uL (ref 150.0–400.0)
RBC: 4.63 Mil/uL (ref 3.87–5.11)
RDW: 12.9 % (ref 11.5–15.5)
WBC: 9.1 10*3/uL (ref 4.0–10.5)

## 2024-03-07 LAB — IBC PANEL
Iron: 77 ug/dL (ref 42–145)
Saturation Ratios: 23.6 % (ref 20.0–50.0)
TIBC: 326.2 ug/dL (ref 250.0–450.0)
Transferrin: 233 mg/dL (ref 212.0–360.0)

## 2024-03-07 LAB — COMPREHENSIVE METABOLIC PANEL WITH GFR
ALT: 22 U/L (ref 0–35)
AST: 18 U/L (ref 0–37)
Albumin: 4.3 g/dL (ref 3.5–5.2)
Alkaline Phosphatase: 69 U/L (ref 39–117)
BUN: 16 mg/dL (ref 6–23)
CO2: 25 meq/L (ref 19–32)
Calcium: 9.3 mg/dL (ref 8.4–10.5)
Chloride: 105 meq/L (ref 96–112)
Creatinine, Ser: 0.56 mg/dL (ref 0.40–1.20)
GFR: 111.19 mL/min (ref >60.00)
Glucose, Bld: 94 mg/dL (ref 70–99)
Potassium: 3.8 meq/L (ref 3.5–5.1)
Sodium: 138 meq/L (ref 135–145)
Total Bilirubin: 1 mg/dL (ref 0.2–1.2)
Total Protein: 7.5 g/dL (ref 6.0–8.3)

## 2024-03-07 LAB — SEDIMENTATION RATE: Sed Rate: 9 mm/h (ref 0–20)

## 2024-03-07 LAB — URIC ACID: Uric Acid, Serum: 2 mg/dL — ABNORMAL LOW (ref 2.4–7.0)

## 2024-03-07 NOTE — Patient Instructions (Signed)
 Patellar strap [employer] Eccentric for patella Upper arm keep increasing activity See me in 2-3 months

## 2024-03-08 ENCOUNTER — Encounter: Payer: Self-pay | Admitting: Family Medicine

## 2024-03-08 ENCOUNTER — Ambulatory Visit: Payer: Self-pay | Admitting: Family Medicine

## 2024-03-08 DIAGNOSIS — M7650 Patellar tendinitis, unspecified knee: Secondary | ICD-10-CM | POA: Insufficient documentation

## 2024-03-08 DIAGNOSIS — R490 Dysphonia: Secondary | ICD-10-CM | POA: Diagnosis not present

## 2024-03-08 DIAGNOSIS — H9202 Otalgia, left ear: Secondary | ICD-10-CM | POA: Diagnosis not present

## 2024-03-08 DIAGNOSIS — M62522 Muscle wasting and atrophy, not elsewhere classified, left upper arm: Secondary | ICD-10-CM | POA: Insufficient documentation

## 2024-03-08 DIAGNOSIS — H9312 Tinnitus, left ear: Secondary | ICD-10-CM | POA: Diagnosis not present

## 2024-03-08 LAB — VITAMIN D 25 HYDROXY (VIT D DEFICIENCY, FRACTURES): VITD: 27.05 ng/mL — ABNORMAL LOW (ref 30.00–100.00)

## 2024-03-08 LAB — TSH: TSH: 0.77 u[IU]/mL (ref 0.35–5.50)

## 2024-03-08 LAB — FERRITIN: Ferritin: 49 ng/mL (ref 10.0–291.0)

## 2024-03-08 LAB — VITAMIN B12: Vitamin B-12: 354 pg/mL (ref 211–911)

## 2024-03-08 NOTE — Assessment & Plan Note (Signed)
 Left knee seems to have more of a patellar tendinitis.  Possible fat pad irritation from patient doing some flooring recently.  Discussed icing regimen, discussed a patella strap, increase activity slowly.  Follow-up again in 6 to 8 weeks.  Worsening pain may need to consider formal physical therapy.  May consider ultrasound at follow-up.

## 2024-03-08 NOTE — Assessment & Plan Note (Signed)
 Patient does have some atrophy noted, no significant family history of autoimmune disease.  Would like to check some laboratory workup, x-rays of the neck pending.  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  Given some exercises to try.  Discussed icing regimen and home exercises.  Increase activity slowly.  I do think patient could be a candidate for possible osteopathic manipulation if necessary at follow-up.

## 2024-03-09 LAB — PTH, INTACT AND CALCIUM
Calcium: 9.3 mg/dL (ref 8.6–10.2)
PTH: 21 pg/mL (ref 16–77)

## 2024-03-28 ENCOUNTER — Other Ambulatory Visit: Payer: Self-pay | Admitting: Family Medicine

## 2024-03-28 DIAGNOSIS — F5104 Psychophysiologic insomnia: Secondary | ICD-10-CM

## 2024-03-29 NOTE — Telephone Encounter (Signed)
 Medication discussed at her physical in March.  Controlled substance database reviewed.  Alprazolam  No. 30 last filled on 02/22/2024.  Previously 01/01/2024.  No concerns.  Refill ordered.

## 2024-04-04 ENCOUNTER — Ambulatory Visit: Admitting: Family Medicine

## 2024-04-27 NOTE — Progress Notes (Unsigned)
 [name] [address] [place] [address] Phone: [phone] Subjective:   Ruth [name], am serving as a scribe for Dr. Arthea Claudene.  I'm seeing this patient by the request  of:  Levora Reyes SAUNDERS, MD  CC: Neck pain, left sided pain  YEP:[id number]  03/07/2024 Left knee seems to have more of a patellar tendinitis.  Possible fat pad irritation from patient doing some flooring recently.  Discussed icing regimen, discussed a patella strap, increase activity slowly.  Follow-up again in 6 to 8 weeks.  Worsening pain may need to consider formal physical therapy.  May consider ultrasound at follow-up.     Patient does have some atrophy noted, no significant family history of autoimmune disease.  Would like to check some laboratory workup, x-rays of the neck pending.  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  Given some exercises to try.  Discussed icing regimen and home exercises.  Increase activity slowly.  I do think patient could be a candidate for possible osteopathic manipulation if necessary at follow-up.     Updated 04/28/2024 Ruth [name] is a 42 y.o. female coming in with complaint of knee and upper arm pain. Pain, numbness, tingling feeling along L arm and sometimes inner thigh. All L side. Pain is mild, started about 1 months before last appointment.       Past Medical History:  Diagnosis Date   Allergy    Arthritis    Atypical mole 08/26/2011   Left Breast (mild)   Frequency-urgency sydrome    GERD (gastroesophageal reflux disease)    no meds   PONV (postoperative nausea and vomiting)    Past Surgical History:  Procedure Laterality Date   LAPAROSCOPY  04/19/2012   Procedure: LAPAROSCOPY DIAGNOSTIC;  Surgeon: Carlin LELON Forbes, MD;  Location: [facility];  Service: Gynecology;  Laterality: N/A;  left  ovarian cystectomy with excision of BENIGN l cystic 7.6cm left teratoma WITH ENDO CATCH REMOVAL and cauterization of endometriosis   nasal septoplasty  '11   SKIN BIOPSY Left 09/05/2011   left breast- 0 tmt.    WISDOM TOOTH EXTRACTION     Social History   Socioeconomic History   Marital status: Single    Spouse name: Not on file   Number of children: Not on file   Years of education: Not on file   Highest education level: Not on file  Occupational History   Not on file  Tobacco Use   Smoking status: Former    Current packs/day: 0.00    Types: Cigarettes    Quit date: 04/12/2004    Years since quitting: 20.0   Smokeless tobacco: Never  Vaping Use   Vaping status: Never Used  Substance and Sexual Activity   Alcohol use: Yes    Comment: social   Drug use: No   Sexual activity: Yes  Other Topics Concern   Not on file  Social History Narrative   Lives with partner.     Social Drivers of Corporate investment banker Strain: Not on file  Food Insecurity: Not on file  Transportation Needs: Not on file  Physical Activity: Not on file  Stress: Not on file  Social Connections: Unknown (07/07/2023)   Received from [facility]   Social Network    Social Network: Not on file   Allergies  Allergen  Reactions   Lactose Hives   Lactose Intolerance (Gi) Hives    ANY MILK  BASED PROTEIN   Whey     Other Reaction(s): Unknown   Whey Protein [Protein] Hives   Family History  Problem Relation Age of Onset   Stroke Father    Cancer Maternal Grandmother    Diabetes Maternal Grandmother          Current Outpatient Medications (Other):    ALPRAZolam  (XANAX ) 0.5 MG tablet, TAKE 1 TABLET BY MOUTH AT BEDTIME AS NEEDED FOR ANXIETY   Reviewed prior external information including notes and imaging from  primary care provider As well as notes that were available from care everywhere and other healthcare systems.  Past medical history, social, surgical and family history all  reviewed in electronic medical record.  No pertanent information unless stated regarding to the chief complaint.   Review of Systems:  No  visual changes, nausea, vomiting, diarrhea, constipation, dizziness, abdominal pain, skin rash, fevers, chills, night sweats, weight loss, swollen lymph nodes, body aches, joint swelling, chest pain, shortness of breath, mood changes. POSITIVE muscle aches, fatigue, headache  Objective  Blood pressure 106/70, pulse 90, height 5' 4 (1.626 m), weight 139 lb (63 kg), SpO2 98%.   General: No apparent distress alert and oriented x3 mood and affect normal, dressed appropriately.  HEENT: Pupils equal, extraocular movements intact  Respiratory: Patient's speak in full sentences and does not appear short of breath  Cardiovascular: No lower extremity edema, non tender, no erythema  Neck exam does have some loss lordosis noted.  Patient does seem to get maybe admission dizzy with extension.  Left side seems to be strong but states that there is some numbness on the inside part of her arm left minorly.  Does withdraw from pinch.  Leg negative straight leg test noted.    Impression and Recommendations:    The above documentation has been reviewed and is accurate and complete Taber Sweetser M Dayna Alia, DO

## 2024-04-28 ENCOUNTER — Ambulatory Visit

## 2024-04-28 ENCOUNTER — Ambulatory Visit: Admitting: Family Medicine

## 2024-04-28 ENCOUNTER — Encounter: Payer: Self-pay | Admitting: Family Medicine

## 2024-04-28 VITALS — BP 106/70 | HR 90 | Ht 64.0 in | Wt 139.0 lb

## 2024-04-28 DIAGNOSIS — E538 Deficiency of other specified B group vitamins: Secondary | ICD-10-CM

## 2024-04-28 DIAGNOSIS — M542 Cervicalgia: Secondary | ICD-10-CM

## 2024-04-28 DIAGNOSIS — G8929 Other chronic pain: Secondary | ICD-10-CM

## 2024-04-28 MED ORDER — CYANOCOBALAMIN 1000 MCG/ML IJ SOLN
1000.0000 ug | Freq: Once | INTRAMUSCULAR | Status: AC
  Administered 2024-04-28: 1000 ug via INTRAMUSCULAR

## 2024-04-28 NOTE — Assessment & Plan Note (Signed)
 History of B12 deficiency and given injection if this does seem to improve symptoms then we may consider repeating injections weekly for a total of 4 weeks and then monthly thereafter.

## 2024-04-28 NOTE — Assessment & Plan Note (Signed)
 Chronic neck pain and seems to be now more on the left side.  Some radicular symptoms in the upper arm as well as lower arm.  Has some dizziness at baseline as well.  I think at this point we do need to get x-rays to further evaluate the neck again but do feel an MRI of the cervical and MRI of the brain with and without contrast would be beneficial in this individual.  Patient is having signs and symptoms that could be either a demyelinating factor.  Father has had a history of stroke at about younger age patient used to be a smoker.  Will get workup and see if there is any findings and depending on that we will discussed otherwise.

## 2024-04-28 NOTE — Patient Instructions (Addendum)
 Xray today B12 injection today Good to see you! [facility] [phone] Call Today  When we receive your results we will contact you.

## 2024-05-05 ENCOUNTER — Ambulatory Visit: Admitting: Family Medicine

## 2024-05-10 ENCOUNTER — Ambulatory Visit
Admission: RE | Admit: 2024-05-10 | Discharge: 2024-05-10 | Disposition: A | Discharge status: Home / Self Care | Source: Ambulatory Visit | Attending: Family Medicine | Admitting: Family Medicine

## 2024-05-10 DIAGNOSIS — M47812 Spondylosis without myelopathy or radiculopathy, cervical region: Secondary | ICD-10-CM | POA: Diagnosis not present

## 2024-05-10 DIAGNOSIS — Q283 Other malformations of cerebral vessels: Secondary | ICD-10-CM | POA: Diagnosis not present

## 2024-05-10 DIAGNOSIS — M4312 Spondylolisthesis, cervical region: Secondary | ICD-10-CM | POA: Diagnosis not present

## 2024-05-10 DIAGNOSIS — M542 Cervicalgia: Secondary | ICD-10-CM

## 2024-05-10 MED ORDER — GADOBUTROL 1 MMOL/ML IV SOLN
6.0000 mL | Freq: Once | INTRAVENOUS | Status: AC | PRN
  Administered 2024-05-10: 6 mL via INTRAVENOUS

## 2024-05-12 ENCOUNTER — Ambulatory Visit: Payer: Self-pay | Admitting: Family Medicine

## 2024-05-21 ENCOUNTER — Other Ambulatory Visit: Payer: Self-pay | Admitting: Family Medicine

## 2024-05-21 DIAGNOSIS — F5104 Psychophysiologic insomnia: Secondary | ICD-10-CM

## 2024-05-22 ENCOUNTER — Other Ambulatory Visit

## 2024-05-23 NOTE — Telephone Encounter (Signed)
 Requested Prescriptions   Pending Prescriptions Disp Refills   ALPRAZolam  (XANAX ) 0.5 MG tablet [Pharmacy Med Name: ALPRAZOLAM  0.5 MG TABLET] 30 tablet 0    Sig: TAKE 1 TABLET BY MOUTH AT BEDTIME AS NEEDED FOR ANXIETY     Date of patient request: 05/23/24 Last office visit: 01/04/2024 Upcoming visit: 05/30/2024 Date of last refill: 03/29/24 Last refill amount: 30 days

## 2024-05-30 ENCOUNTER — Encounter: Payer: Self-pay | Admitting: Family Medicine

## 2024-05-30 ENCOUNTER — Ambulatory Visit: Admitting: Family Medicine

## 2024-05-30 VITALS — BP 114/62 | HR 83 | Temp 97.9°F | Resp 17 | Ht 64.0 in | Wt 137.2 lb

## 2024-05-30 DIAGNOSIS — Z23 Encounter for immunization: Secondary | ICD-10-CM

## 2024-05-30 DIAGNOSIS — F5104 Psychophysiologic insomnia: Secondary | ICD-10-CM | POA: Diagnosis not present

## 2024-05-30 NOTE — Progress Notes (Unsigned)
 Subjective:  Patient ID: Ruth [name], female    DOB: 1981-06-26  Age: 43 y.o. MRN: [id number]  CC:  Chief Complaint  Patient presents with   Medical Management of Chronic Issues    Pt is well, no concerns, notes has been getting PT for shoulder Lt shoulder FYI only     HPI [name] presents for   Psychophysiologic insomnia Multiple prior attempts with melatonin, hydroxyzine , therapy including for sleep will be on anxiety treatment, sleep hygiene, CBT.  Ultimately well-controlled with low-dose of alprazolam , 0.5 mg pill which she breaks into portions to obtain approximately 0.3 to 0.4 mg.  This has been effective for treating insomnia without parasomnias daytime anxiety and appropriate use based on controlled substance database review. Still effective with small remnant off full pill. Some running - going well.  No parasomnias/daytime sedation or new side effects.  Controlled substance database reviewed with #30 alprazolam  last filled on 05/23/2024, previously 03/29/2024, 02/22/2024. Saw ENT for tinnitus. No changes. Background noise for masking.      05/30/2024   12:59 PM 01/04/2024   12:52 PM 07/06/2023    3:43 PM 12/25/2022    1:45 PM 06/26/2022    1:34 PM  Depression screen PHQ 2/9  Decreased Interest 0 0 0 0 0  Down, Depressed, Hopeless 0 0 0 0 0  PHQ - 2 Score 0 0 0 0 0  Altered sleeping 0 0 0 1 0  Tired, decreased energy 0 0 0 0 0  Change in appetite 0 0 0 0 0  Feeling bad or failure about yourself  0 0 0 0 0  Trouble concentrating 0 0 0 0 0  Moving slowly or fidgety/restless 0 0 0 0 0  Suicidal thoughts 0 0 0 0 0  PHQ-9 Score 0 0 0 1 0  Difficult doing work/chores Not difficult at all Not difficult at all         05/30/2024   12:59 PM 01/04/2024   12:52 PM 07/06/2023    3:43 PM 12/25/2022    1:45 PM  GAD 7 : Generalized Anxiety Score  Nervous, Anxious, on Edge 0 0 0 0  Control/stop worrying 0 0 0 0  Worry too much - different things 0 0 0 0  Trouble relaxing 0 0 0 0   Restless 0 0 0 0  Easily annoyed or irritable 0 0 0 0  Afraid - awful might happen 0 0 0 0  Total GAD 7 Score 0 0 0 0  Anxiety Difficulty Not difficult at all Not difficult at all         Shoulder pain, neck pain Followed by Dr. Claudene [facility].  MRI of cervical spine and brain ordered at her July visit, and B12 injection given for B12 deficiency.  Cervical spine MRI July 23, grade 1 anterolisthesis of C6-7, C7 T1, T1-2, mild cervical spondylosis.  No significant spinal canal or foraminal stenosis.  MRI brain without evidence of acute intracranial abnormality, mildly low-lying cerebellar tonsils.  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   She is undergoing physical therapy. Improving. No meds. Appt with Dr. Claudene in a few weeks for follow up.    HM: Plan for Gardasil today.    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   Otalgia of left ear 07/05/2019   Chest pain 12/02/2018   Tachycardia 12/02/2018   Tonsil asymmetry 04/14/2017   Chronic neck pain 12/29/2016   Chronic pain syndrome 12/29/2016   Nonallopathic lesion of cervical region 12/29/2016   Nonallopathic lesion of thoracic region 12/29/2016   Nonallopathic lesion of lumbosacral region 12/29/2016   Trigger index finger of left hand 04/03/2016   Rectal bleeding 03/18/2016   Rectal pain 03/18/2016   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   HYPOTENSION 07/23/2007   ACNE, MILD 07/23/2007   Past Medical History:  Diagnosis Date   Allergy    Arthritis    Atypical mole 08/26/2011   Left Breast (mild)   Frequency-urgency  sydrome    GERD (gastroesophageal reflux disease)    no meds   PONV (postoperative nausea and vomiting)    Past Surgical History:  Procedure Laterality Date   LAPAROSCOPY  04/19/2012   Procedure: LAPAROSCOPY DIAGNOSTIC;  Surgeon: Carlin LELON Forbes, MD;  Location: [facility];  Service: Gynecology;  Laterality: N/A;  left ovarian cystectomy with excision of BENIGN l cystic 7.6cm left teratoma WITH ENDO CATCH REMOVAL and cauterization of endometriosis   nasal septoplasty  '11   SKIN BIOPSY Left 09/05/2011   left breast- 0 tmt.    WISDOM TOOTH EXTRACTION     Allergies  Allergen Reactions   Lactose Hives   Lactose Intolerance (Gi) Hives    ANY MILK  BASED PROTEIN   Whey     Other Reaction(s): Unknown   Whey Protein [Protein] Hives   Prior to Admission medications   Medication Sig Start Date End Date Taking? Authorizing Provider  ALPRAZolam  (XANAX ) 0.5 MG tablet TAKE 1 TABLET BY MOUTH AT BEDTIME AS NEEDED FOR ANXIETY 05/23/24  Yes Jerrell Cleatus Ned, MD   Social History   Socioeconomic History   Marital status: Single    Spouse name: Not on file   Number of children: Not on file   Years of education: Not on file   Highest education level: Not on file  Occupational History   Not on file  Tobacco Use   Smoking status: Former    Current packs/day: 0.00    Types: Cigarettes    Quit date: 04/12/2004    Years since quitting: 20.1   Smokeless tobacco: Never  Vaping Use   Vaping status: Never Used  Substance and Sexual Activity   Alcohol use: Yes    Comment: social   Drug use: No   Sexual activity: Yes  Other Topics Concern   Not on file  Social History Narrative   Lives with partner.     Social Drivers of Corporate investment banker Strain: Not on file  Food Insecurity: Not on file  Transportation Needs: Not on file  Physical Activity: Not on file  Stress: Not on file  Social Connections: Unknown (07/07/2023)   [facility]   Social  Network    Social Network: Not on file  Intimate Partner Violence: Not At Risk (09/03/2023)   Received from [facility]   HITS    Over the last 12 months how often did your partner physically hurt you?: Never    Over the last 12 months how often did your partner insult you or talk down to you?: Never  Over the last 12 months how often did your partner threaten you with physical harm?: Never    Over the last 12 months how often did your partner scream or curse at you?: Never    Review of Systems per HPI.   Objective:   Vitals:   05/30/24 1250  BP: 114/62  Pulse: 83  Resp: 17  Temp: 97.9 F (36.6 C)  TempSrc: Temporal  SpO2: 100%  Weight: 137 lb 3.2 oz (62.2 kg)  Height: 5' 4 (1.626 m)     Physical Exam Vitals reviewed.  Constitutional:      Appearance: Normal appearance. She is well-developed.  HENT:     Head: Normocephalic and atraumatic.  Eyes:     Conjunctiva/sclera: Conjunctivae normal.     Pupils: Pupils are equal, round, and reactive to light.  Neck:     Vascular: No carotid bruit.  Cardiovascular:     Rate and Rhythm: Normal rate and regular rhythm.     Heart sounds: Normal heart sounds.  Pulmonary:     Effort: Pulmonary effort is normal.     Breath sounds: Normal breath sounds.  Abdominal:     Palpations: Abdomen is soft. There is no pulsatile mass.     Tenderness: There is no abdominal tenderness.  Musculoskeletal:     Right lower leg: No edema.     Left lower leg: No edema.  Skin:    General: Skin is warm and dry.  Neurological:     Mental Status: She is alert and oriented to person, place, and time.  Psychiatric:        Mood and Affect: Mood normal.        Behavior: Behavior normal.        Thought Content: Thought content normal.        Assessment & Plan:  IXCHEL DUCK is a 43 y.o. female . Psychophysiological insomnia  - Stable with current med regimen, continue same.  Okay to refill when due next, and 28-month follow-up for  physical.  Need for HPV vaccination - Plan: HPV 9-valent vaccine,Recombinat -First of 3 vaccines ordered.  Unsure after visit whether vaccine was given, attempts at contacting patient unsuccessful.  Will continue to attempt to contact patient, and if she did receive vaccine will reorder, otherwise can schedule nurse visit for HPV vaccination including subsequent vaccines.  No orders of the defined types were placed in this encounter.  Patient Instructions  No change in medications at this time.  Follow-up in 7 months for a physical at that time.  Keep follow-up with Dr. Claudene.  Glad to hear physical therapy has been helpful.  First HPV vaccine given today, can be repeated in 1 month, and then in 6 months.  Okay to wait until your follow-up with me for the third dose if you would like to coordinate that at the same time.  Take care!    Signed,   Reyes Pines, MD [facility] 05/30/24 1:23 PM

## 2024-05-30 NOTE — Patient Instructions (Signed)
 No change in medications at this time.  Follow-up in 7 months for a physical at that time.  Keep follow-up with Dr. Claudene.  Glad to hear physical therapy has been helpful.  First HPV vaccine given today, can be repeated in 1 month, and then in 6 months.  Okay to wait until your follow-up with me for the third dose if you would like to coordinate that at the same time.  Take care!

## 2024-05-31 ENCOUNTER — Telehealth: Payer: Self-pay | Admitting: Family Medicine

## 2024-05-31 ENCOUNTER — Encounter: Payer: Self-pay | Admitting: Family Medicine

## 2024-05-31 NOTE — Telephone Encounter (Signed)
 LM to call back so we can confirm if she did receive this vaccine. Then we can document if this was performed. If this was not then patient will need a nurse visit to complete this

## 2024-05-31 NOTE — Telephone Encounter (Signed)
 HPV vaccine was ordered yesterday, but unsure if that was given.  Please call patient, and if she did not receive HPV vaccine, can schedule as a nurse visit.  Let me know either way so I can delete the order and close chart from yesterday.  Thanks.

## 2024-06-01 NOTE — Telephone Encounter (Signed)
 LM to call back so we can confirm if she did receive this vaccine. Then we can document if this was performed. If this was not then patient will need a nurse visit to complete this

## 2024-06-01 NOTE — Telephone Encounter (Signed)
 LVM to call office. Letter sent out

## 2024-06-06 ENCOUNTER — Ambulatory Visit

## 2024-07-04 ENCOUNTER — Ambulatory Visit: Admitting: Family Medicine

## 2024-07-10 ENCOUNTER — Other Ambulatory Visit: Payer: Self-pay | Admitting: Student in an Organized Health Care Education/Training Program

## 2024-07-10 DIAGNOSIS — F5104 Psychophysiologic insomnia: Secondary | ICD-10-CM

## 2024-07-11 ENCOUNTER — Ambulatory Visit

## 2024-07-11 NOTE — Telephone Encounter (Signed)
 Medication discussed at her August 11 visit.  Controlled substance database reviewed.  #30 last filled on 05/23/2024, previously 03/29/2024.  Refills noted over past year, no concerns.  Refill ordered.

## 2024-07-11 NOTE — Telephone Encounter (Signed)
 Requested Prescriptions   Pending Prescriptions Disp Refills   ALPRAZolam  (XANAX ) 0.5 MG tablet [Pharmacy Med Name: ALPRAZOLAM  0.5 MG TABLET] 30 tablet 0    Sig: TAKE 1 TABLET BY MOUTH AT BEDTIME AS NEEDED FOR ANXIETY     Date of patient request: 07/11/24 Last office visit: Visit date not found Upcoming visit: 01/04/25 Date of last refill: 05/23/24 Last refill amount: 30 tablets

## 2024-07-27 NOTE — Progress Notes (Unsigned)
 [name] [address] [place] [address] Phone: [phone] Subjective:   LILLETTE Berwyn Posey, am serving as a scribe for Dr. Arthea Claudene.  I'm seeing this patient by the request  of:  Levora Reyes SAUNDERS, MD  CC: Left knee and right shoulder pain  YEP:Dlagzrupcz  04/28/2024 History of B12 deficiency and given injection if this does seem to improve symptoms then we may consider repeating injections weekly for a total of 4 weeks and then monthly thereafter.     Chronic neck pain and seems to be now more on the left side.  Some radicular symptoms in the upper arm as well as lower arm.  Has some dizziness at baseline as well.  I think at this point we do need to get x-rays to further evaluate the neck again but do feel an MRI of the cervical and MRI of the brain with and without contrast would be beneficial in this individual.  Patient is having signs and symptoms that could be either a demyelinating factor.  Father has had a history of stroke at about younger age patient used to be a smoker.  Will get workup and see if there is any findings and depending on that we will discussed otherwise.     Updated 07/28/2024 HARJIT DOUDS is a 43 y.o. female coming in with complaint of neck pain. Patient states that her arm and leg pain have stopped. Less atrophy in these areas.   Continues to have problems with L knee under the patella.   Feels like she is getting frozen shoulder in R arm.        Past Medical History:  Diagnosis Date   Allergy    Arthritis    Atypical mole 08/26/2011   Left Breast (mild)   Frequency-urgency sydrome    GERD (gastroesophageal reflux disease)    no meds   PONV (postoperative nausea and vomiting)    Past Surgical History:  Procedure Laterality Date   LAPAROSCOPY  04/19/2012   Procedure: LAPAROSCOPY DIAGNOSTIC;  Surgeon: Carlin LELON Forbes, MD;  Location: [facility];  Service: Gynecology;  Laterality: N/A;  left  ovarian cystectomy with excision of BENIGN l cystic 7.6cm left teratoma WITH ENDO CATCH REMOVAL and cauterization of endometriosis   nasal septoplasty  '11   SKIN BIOPSY Left 09/05/2011   left breast- 0 tmt.    WISDOM TOOTH EXTRACTION     Social History   Socioeconomic History   Marital status: Single    Spouse name: Not on file   Number of children: Not on file   Years of education: Not on file   Highest education level: Not on file  Occupational History   Not on file  Tobacco Use   Smoking status: Former    Current packs/day: 0.00    Types: Cigarettes    Quit date: 04/12/2004    Years since quitting: 20.3   Smokeless tobacco: Never  Vaping Use   Vaping status: Never Used  Substance and Sexual Activity   Alcohol use: Yes    Comment: social   Drug use: No   Sexual activity: Yes  Other Topics Concern   Not on file  Social History Narrative   Lives with partner.     Social Drivers of Corporate investment banker Strain: Not on file  Food Insecurity: Not on file  Transportation Needs: Not on file  Physical Activity: Not on file  Stress: Not on file  Social Connections: Unknown (07/07/2023)   Received from [facility]   Social Network    Social Network: Not on file   Allergies  Allergen Reactions   Lactose Hives   Lactose Intolerance (Gi) Hives    ANY MILK  BASED PROTEIN   Whey     Other Reaction(s): Unknown   Whey Protein [Protein] Hives   Family History  Problem Relation Age of Onset   Stroke Father    Cancer Maternal Grandmother    Diabetes Maternal Grandmother          Current Outpatient Medications (Other):    ALPRAZolam  (XANAX ) 0.5 MG tablet, TAKE 1 TABLET BY MOUTH AT BEDTIME AS NEEDED FOR ANXIETY   Reviewed prior external information including notes and imaging from  primary care provider As well as notes that were available from care everywhere and other healthcare systems.  Past medical history, social, surgical and family history all  reviewed in electronic medical record.  No pertanent information unless stated regarding to the chief complaint.   Review of Systems:  No headache, visual changes, nausea, vomiting, diarrhea, constipation, dizziness, abdominal pain, skin rash, fevers, chills, night sweats, weight loss, swollen lymph nodes, body aches, joint swelling, chest pain, shortness of breath, mood changes. POSITIVE muscle aches  Objective  Blood pressure 100/64, pulse 73, height 5' 4 (1.626 m), weight 138 lb (62.6 kg), SpO2 97%.   General: No apparent distress alert and oriented x3 mood and affect normal, dressed appropriately.  HEENT: Pupils equal, extraocular movements intact  Respiratory: Patient's speak in full sentences and does not appear short of breath  Cardiovascular: No lower extremity edema, non tender, no erythema  Left knee does have some tenderness to palpation still over the patella tendon itself.  Seems to be mid substance.  No significant swelling no noted in the area.  Patient has some mild tightness noted with full flexion   Right shoulder does show some limited range of motion and external range of motion in the last 5 degrees of internal range of motion. Impression and Recommendations:    The above documentation has been reviewed and is accurate and complete Helena Sardo M Deavon Podgorski, DO

## 2024-07-28 ENCOUNTER — Ambulatory Visit (INDEPENDENT_AMBULATORY_CARE_PROVIDER_SITE_OTHER): Admitting: Family Medicine

## 2024-07-28 ENCOUNTER — Ambulatory Visit

## 2024-07-28 VITALS — BP 100/64 | HR 73 | Ht 64.0 in | Wt 138.0 lb

## 2024-07-28 DIAGNOSIS — M25562 Pain in left knee: Secondary | ICD-10-CM | POA: Diagnosis not present

## 2024-07-28 DIAGNOSIS — M25511 Pain in right shoulder: Secondary | ICD-10-CM

## 2024-07-28 DIAGNOSIS — M7501 Adhesive capsulitis of right shoulder: Secondary | ICD-10-CM

## 2024-07-28 DIAGNOSIS — M7652 Patellar tendinitis, left knee: Secondary | ICD-10-CM

## 2024-07-28 NOTE — Assessment & Plan Note (Signed)
 Continue to have difficulty with the patella tendinitis.  Discussed the potential of different treatment options but with patient having history of headaches we will hold on nitroglycerin patches.  Discussed the patella strap and see how patient does.  Discussed with patient that if continuing to have difficulty advanced imaging with an MRI will be needed with patient failing everything including oral anti-inflammatories, physical therapy, icing regimen, and oral anti-inflammatories.  Follow-up in 8 weeks

## 2024-07-28 NOTE — Assessment & Plan Note (Signed)
 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  He did have a viral illness that probably contributed to this.  This is on the right side at the moment.  Will monitor.  Worsening pain need to consider injection.  Given home exercises at this time

## 2024-07-28 NOTE — Patient Instructions (Signed)
 Frozen shoulder ex Xray today Patellar strap We need to consider MRI if strap does not work See me again in 2 months

## 2024-08-15 DIAGNOSIS — B351 Tinea unguium: Secondary | ICD-10-CM | POA: Diagnosis not present

## 2024-08-15 DIAGNOSIS — L821 Other seborrheic keratosis: Secondary | ICD-10-CM | POA: Diagnosis not present

## 2024-08-15 DIAGNOSIS — D224 Melanocytic nevi of scalp and neck: Secondary | ICD-10-CM | POA: Diagnosis not present

## 2024-08-15 DIAGNOSIS — L738 Other specified follicular disorders: Secondary | ICD-10-CM | POA: Diagnosis not present

## 2024-08-15 DIAGNOSIS — L603 Nail dystrophy: Secondary | ICD-10-CM | POA: Diagnosis not present

## 2024-08-24 ENCOUNTER — Other Ambulatory Visit: Payer: Self-pay | Admitting: Family Medicine

## 2024-08-24 DIAGNOSIS — F5104 Psychophysiologic insomnia: Secondary | ICD-10-CM

## 2024-08-24 NOTE — Telephone Encounter (Signed)
 Requested Prescriptions   Pending Prescriptions Disp Refills   ALPRAZolam  (XANAX ) 0.5 MG tablet [Pharmacy Med Name: ALPRAZOLAM  0.5 MG TABLET] 30 tablet 0    Sig: TAKE 1 TABLET BY MOUTH AT BEDTIME AS NEEDED FOR ANXIETY     Date of patient request: 08/24/24 Last office visit: 05/30/2024 Upcoming visit: 01/04/2025 Date of last refill: 07/11/24 Last refill amount: 30

## 2024-08-25 NOTE — Telephone Encounter (Signed)
 Medication discussed at August 11 visit.  Controlled substance database reviewed.  #30 of alprazolam  last filled on 07/11/2024, previously 05/23/2024, 03/29/2024, no concerns.  Refill ordered.

## 2024-09-13 DIAGNOSIS — K649 Unspecified hemorrhoids: Secondary | ICD-10-CM | POA: Diagnosis not present

## 2024-09-13 DIAGNOSIS — K921 Melena: Secondary | ICD-10-CM | POA: Diagnosis not present

## 2024-09-26 ENCOUNTER — Telehealth: Admitting: Family Medicine

## 2024-09-26 DIAGNOSIS — R300 Dysuria: Secondary | ICD-10-CM

## 2024-09-26 MED ORDER — CEPHALEXIN 500 MG PO CAPS: 500.0000 mg | ORAL_CAPSULE | Freq: Two times a day (BID) | ORAL | 0 refills | Status: AC

## 2024-09-26 NOTE — Progress Notes (Signed)

## 2024-09-27 NOTE — Progress Notes (Unsigned)
 [name] 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  He did have a viral illness that probably contributed to this.  This is on the right side at the moment.  Will monitor.  Worsening pain need to consider injection.  Given home exercises at this time     Continue to have difficulty with the patella tendinitis.  Discussed the potential of different treatment options but with patient having history of headaches we will hold on nitroglycerin patches.  Discussed the patella strap and see how patient does.  Discussed with patient that if continuing to have difficulty advanced imaging with an MRI will be needed with patient failing everything including oral anti-inflammatories, physical therapy, icing regimen, and oral anti-inflammatories.  Follow-up in 8 weeks     Updated 09/29/2024 Ruth [name] is a 43 y.o. female coming in with complaint of shoulder and knee pain       Past Medical History:  Diagnosis Date   Allergy    Arthritis    Atypical mole 08/26/2011   Left Breast (mild)   Frequency-urgency sydrome    GERD (gastroesophageal reflux disease)    no meds   PONV (postoperative nausea and vomiting)    Past Surgical History:  Procedure Laterality Date   LAPAROSCOPY  04/19/2012   Procedure: LAPAROSCOPY DIAGNOSTIC;  Surgeon: Carlin LELON Forbes, MD;  Location: [facility];  Service: Gynecology;  Laterality: N/A;  left ovarian cystectomy with excision of BENIGN l cystic 7.6cm left teratoma WITH ENDO CATCH REMOVAL and cauterization of endometriosis   nasal septoplasty  '11   SKIN BIOPSY Left 09/05/2011   left breast- 0 tmt.    WISDOM TOOTH EXTRACTION     Social History   Socioeconomic History   Marital status: Single    Spouse name: Not on file    Number of children: Not on file   Years of education: Not on file   Highest education level: Not on file  Occupational History   Not on file  Tobacco Use   Smoking status: Former    Current packs/day: 0.00    Types: Cigarettes    Quit date: 04/12/2004    Years since quitting: 20.4   Smokeless tobacco: Never  Vaping Use   Vaping status: Never Used  Substance and Sexual Activity   Alcohol use: Yes    Comment: social   Drug use: No   Sexual activity: Yes  Other Topics Concern   Not on file  Social History Narrative   Lives with partner.     Social Drivers of Corporate Investment Banker Strain: Not on file  Food Insecurity: Not on file  Transportation Needs: Not on file  Physical Activity: Not on file  Stress: Not on file  Social Connections: Not on file   Allergies  Allergen Reactions   Lactose Hives   Lactose Intolerance (Gi) Hives    ANY MILK  BASED PROTEIN   Whey     Other Reaction(s): Unknown   Whey Protein [Protein] Hives   Family History  Problem Relation Age of Onset   Stroke Father    Cancer Maternal Grandmother    Diabetes Maternal Grandmother          Current Outpatient Medications (Other):    ALPRAZolam  (  XANAX ) 0.5 MG tablet, TAKE 1 TABLET BY MOUTH AT BEDTIME AS NEEDED FOR ANXIETY   cephALEXin  (KEFLEX ) 500 MG capsule, Take 1 capsule (500 mg total) by mouth 2 (two) times daily for 7 days.   Reviewed prior external information including notes and imaging from  primary care provider As well as notes that were available from care everywhere and other healthcare systems.  Past medical history, social, surgical and family history all reviewed in electronic medical record.  No pertanent information unless stated regarding to the chief complaint.   Review of Systems:  No headache, visual changes, nausea, vomiting, diarrhea, constipation, dizziness, abdominal pain, skin rash, fevers, chills, night sweats, weight loss, swollen lymph nodes, body aches,  joint swelling, chest pain, shortness of breath, mood changes. POSITIVE muscle aches  Objective  There were no vitals taken for this visit.   General: No apparent distress alert and oriented x3 mood and affect normal, dressed appropriately.  HEENT: Pupils equal, extraocular movements intact  Respiratory: Patient's speak in full sentences and does not appear short of breath  Cardiovascular: No lower extremity edema, non tender, no erythema      Impression and Recommendations:

## 2024-09-29 ENCOUNTER — Other Ambulatory Visit: Payer: Self-pay

## 2024-09-29 ENCOUNTER — Ambulatory Visit: Admitting: Family Medicine

## 2024-09-29 VITALS — BP 106/68 | HR 92 | Ht 64.0 in | Wt 139.0 lb

## 2024-09-29 DIAGNOSIS — M25411 Effusion, right shoulder: Secondary | ICD-10-CM | POA: Diagnosis not present

## 2024-09-29 DIAGNOSIS — M25511 Pain in right shoulder: Secondary | ICD-10-CM | POA: Diagnosis not present

## 2024-09-29 NOTE — Patient Instructions (Addendum)
Injection in [facility] joint today Good to see you! See you again in 3 months

## 2024-09-30 ENCOUNTER — Encounter: Payer: Self-pay | Admitting: Family Medicine

## 2024-09-30 DIAGNOSIS — M25411 Effusion, right shoulder: Secondary | ICD-10-CM | POA: Insufficient documentation

## 2024-09-30 NOTE — Assessment & Plan Note (Signed)
 Patient given injection and tolerated the procedure well, discussed icing regimen and home exercises, discussed the differential includes potential frozen shoulder.  Follow-up again in 6 to 12 weeks

## 2024-10-05 ENCOUNTER — Other Ambulatory Visit: Payer: Self-pay | Admitting: Family Medicine

## 2024-10-05 DIAGNOSIS — F5104 Psychophysiologic insomnia: Secondary | ICD-10-CM

## 2024-10-05 NOTE — Telephone Encounter (Signed)
 Requested Prescriptions   Pending Prescriptions Disp Refills   ALPRAZolam  (XANAX ) 0.5 MG tablet [Pharmacy Med Name: ALPRAZOLAM  0.5 MG TABLET] 30 tablet 0    Sig: TAKE 1 TABLET BY MOUTH AT BEDTIME AS NEEDED FOR ANXIETY     Date of patient request: 10/05/24 Last office visit: 08/24/2024 Upcoming visit: 01/04/2025 Date of last refill: 08/25/24 Last refill amount: 30

## 2024-10-05 NOTE — Telephone Encounter (Signed)
 Medication discussed at her August 11 visit.  Controlled substance database reviewed.  Alprazolam  No. 30 last filled on 08/25/2024.  Previously 07/11/2024, no concerns, refill ordered.

## 2024-10-07 ENCOUNTER — Telehealth: Payer: Self-pay | Admitting: Family Medicine

## 2024-10-07 NOTE — Telephone Encounter (Signed)
 Scheduled with Dr. Claudene 12/22 at 11:30.

## 2024-10-07 NOTE — Telephone Encounter (Signed)
 Pt had AC joint injection here on 09/29/2024, has noticed increase in pain. Feels different with pain radiating down arm. Unsure if Dr. Claudene would have any recommendations. I have scheduled pt with [facility] for first avail 12/23 and wait listed.

## 2024-10-10 ENCOUNTER — Other Ambulatory Visit: Payer: Self-pay

## 2024-10-10 ENCOUNTER — Encounter: Payer: Self-pay | Admitting: Family Medicine

## 2024-10-10 ENCOUNTER — Ambulatory Visit: Admitting: Family Medicine

## 2024-10-10 VITALS — Ht 64.0 in

## 2024-10-10 DIAGNOSIS — M79601 Pain in right arm: Secondary | ICD-10-CM

## 2024-10-10 DIAGNOSIS — M7501 Adhesive capsulitis of right shoulder: Secondary | ICD-10-CM

## 2024-10-10 DIAGNOSIS — M25511 Pain in right shoulder: Secondary | ICD-10-CM | POA: Diagnosis not present

## 2024-10-10 NOTE — Progress Notes (Signed)
 " [name] [address] [place] [address] Phone: [phone] Subjective:   ISusannah [name], am serving as a scribe for Dr. Arthea [name].  I'm seeing this patient by the request  of:  Ruth Reyes SAUNDERS, MD  CC: Right shoulder pain follow-up  YEP:Dlagzrupcz  Ruth [name] is a 43 y.o. female coming in with complaint of R shoulder pain. Pain from neck down to fingers on the R side. Medication that she did take didn't touch the pain. Limited ROM.  Starting to affect daily activities, waking her up at night.       Past Medical History:  Diagnosis Date   Allergy    Arthritis    Atypical mole 08/26/2011   Left Breast (mild)   Frequency-urgency sydrome    GERD (gastroesophageal reflux disease)    no meds   PONV (postoperative nausea and vomiting)    Past Surgical History:  Procedure Laterality Date   LAPAROSCOPY  04/19/2012   Procedure: LAPAROSCOPY DIAGNOSTIC;  Surgeon: Carlin LELON Forbes, MD;  Location: [facility];  Service: Gynecology;  Laterality: N/A;  left ovarian cystectomy with excision of BENIGN l cystic 7.6cm left teratoma WITH ENDO CATCH REMOVAL and cauterization of endometriosis   nasal septoplasty  '11   SKIN BIOPSY Left 09/05/2011   left breast- 0 tmt.    WISDOM TOOTH EXTRACTION     Social History   Socioeconomic History   Marital status: Single    Spouse name: Not on file   Number of children: Not on file   Years of education: Not on file   Highest education level: Not on file  Occupational History   Not on file  Tobacco Use   Smoking status: Former    Current packs/day: 0.00    Average packs/day: 1.0 packs/day    Types: Cigarettes    Quit date: 04/12/2004    Years since quitting: 20.5   Smokeless tobacco: Never  Vaping Use   Vaping status: Never Used  Substance and Sexual Activity   Alcohol use: Yes    Comment: social   Drug use: No   Sexual activity: Yes  Other Topics Concern   Not on file   Social History Narrative   Lives with partner.     Social Drivers of Health   Tobacco Use: Medium Risk (09/30/2024)   Patient History    Smoking Tobacco Use: Former    Smokeless Tobacco Use: Never    Passive Exposure: Not on Actuary Strain: Not on file  Food Insecurity: Not on file  Transportation Needs: Not on file  Physical Activity: Not on file  Stress: Not on file  Social Connections: Not on file  Depression (PHQ2-9): Low Risk (05/30/2024)   Depression (PHQ2-9)    PHQ-2 Score: 0  Alcohol Screen: Not on file  Housing: Not on file  Utilities: Not on file  Health Literacy: Not on file   Allergies[1] Family History  Problem Relation Age of Onset   Stroke Father    Cancer Maternal Grandmother    Diabetes Maternal Grandmother     Current Outpatient Medications (Other):    ALPRAZolam  (XANAX ) 0.5 MG tablet, TAKE 1 TABLET BY MOUTH AT BEDTIME AS NEEDED FOR ANXIETY   Reviewed prior external information including notes and imaging from  primary care provider As well as notes that were available from care everywhere and other healthcare systems.  Past medical history, social, surgical and family history all reviewed  in electronic medical record.  No pertanent information unless stated regarding to the chief complaint.   Review of Systems:  No headache, visual changes, nausea, vomiting, diarrhea, constipation, dizziness, abdominal pain, skin rash, fevers, chills, night sweats, weight loss, swollen lymph nodes, body aches, joint swelling, chest pain, shortness of breath, mood changes. POSITIVE muscle aches  Objective  Height 5' 4 (1.626 m).   General: No apparent distress alert and oriented x3 mood and affect normal, dressed appropriately.  HEENT: Pupils equal, extraocular movements intact  Respiratory: Patient's speak in full sentences and does not appear short of breath  Cardiovascular: No lower extremity edema, non tender, no erythema  Limited range of  motion of the right shoulder noted.  In internal and external.  Nontender over the acromioclavicular joint which is an improvement. Procedure: Real-time Ultrasound Guided Injection of right glenohumeral joint Device: GE Logiq Q7  Ultrasound guided injection is preferred based studies that show increased duration, increased effect, greater accuracy, decreased procedural pain, increased response rate with ultrasound guided versus blind injection.  Verbal informed consent obtained.  Time-out conducted.  Noted no overlying erythema, induration, or other signs of local infection.  Skin prepped in a sterile fashion.  Local anesthesia: Topical Ethyl chloride.  With sterile technique and under real time ultrasound guidance:  Joint visualized.  23g 1  inch needle inserted posterior approach. Pictures taken for needle placement. Patient did have injection of  2 cc of 0.5% Marcaine, and 1.0 cc of Kenalog  40 mg/dL. Completed without difficulty  Pain immediately resolved suggesting accurate placement of the medication.  Advised to call if fevers/chills, erythema, induration, drainage, or persistent bleeding.  Images saved Impression: Technically successful ultrasound guided injection.    Impression and Recommendations:    The above documentation has been reviewed and is accurate and complete Ruth CHRISTELLA Sharps, DO        [1]  Allergies Allergen Reactions   Lactose Hives   Lactose Intolerance (Gi) Hives    ANY MILK  BASED PROTEIN   Whey     Other Reaction(s): Unknown   Whey Protein [Protein] Hives   "

## 2024-10-10 NOTE — Patient Instructions (Addendum)
 Good to see you. Injected shoulder today. Happy Holidays. See me again in 2 months [facility] [phone] Call Today

## 2024-10-10 NOTE — Assessment & Plan Note (Signed)
 Given injection and tolerated the procedure well, discussed icing regimen and home exercises, discussed which activities to do and which ones to avoid.  Increase activity slowly.  Discussed icing regimen.  Follow-up again in 2 to 3 months.  Differential includes more of a cervical radiculopathy.  Will order a C7-T1 epidural as well if needed.

## 2024-10-11 ENCOUNTER — Ambulatory Visit: Admitting: Sports Medicine

## 2024-11-11 ENCOUNTER — Telehealth: Payer: Self-pay | Admitting: Family Medicine

## 2024-11-11 ENCOUNTER — Other Ambulatory Visit: Payer: Self-pay

## 2024-11-11 ENCOUNTER — Ambulatory Visit (INDEPENDENT_AMBULATORY_CARE_PROVIDER_SITE_OTHER)

## 2024-11-11 DIAGNOSIS — E538 Deficiency of other specified B group vitamins: Secondary | ICD-10-CM

## 2024-11-11 DIAGNOSIS — G609 Hereditary and idiopathic neuropathy, unspecified: Secondary | ICD-10-CM

## 2024-11-11 MED ORDER — CYANOCOBALAMIN 1000 MCG/ML IJ SOLN
1000.0000 ug | Freq: Once | INTRAMUSCULAR | Status: AC
  Administered 2024-11-11: 1000 ug via INTRAMUSCULAR

## 2024-11-11 NOTE — Telephone Encounter (Signed)
 Patient is scheduled for 11 am today 11/11/24 for B12 injection.

## 2024-11-11 NOTE — Telephone Encounter (Signed)
 Pt would like to be referred to Neurology. Previous nerve pain in leg now has spread to arms and more severe. B12 is not helping. States Dr. Claudene is aware of this condition and should have documentation for referral.  Please let her know via MyChart who we are referring her to.

## 2024-11-11 NOTE — Progress Notes (Signed)
 Patient given B12 injection in left arm. Patient tolerated well.   Patient stated that she is dealing with frozen shoulder in the right shoulder and his currently having pain that feels like all her nerves are being pressed upon in both arms. She stated that she is going to try and get a Neurology appointment soon.

## 2024-11-11 NOTE — Telephone Encounter (Signed)
 Patient called and would like to come in for a B12. Is this okay?

## 2024-12-28 ENCOUNTER — Ambulatory Visit: Admitting: Family Medicine

## 2025-01-04 ENCOUNTER — Encounter: Admitting: Family Medicine
